# Patient Record
Sex: Female | Born: 1945
Health system: Southern US, Community
[De-identification: ages and names within clinical notes are randomized; demographics above are authoritative.]

## PROBLEM LIST (undated history)

## (undated) DIAGNOSIS — K7689 Other specified diseases of liver: Secondary | ICD-10-CM

## (undated) DIAGNOSIS — K5792 Diverticulitis of intestine, part unspecified, without perforation or abscess without bleeding: Secondary | ICD-10-CM

## (undated) DIAGNOSIS — S62109A Fracture of unspecified carpal bone, unspecified wrist, initial encounter for closed fracture: Secondary | ICD-10-CM

## (undated) DIAGNOSIS — M25512 Pain in left shoulder: Secondary | ICD-10-CM

## (undated) DIAGNOSIS — C801 Malignant (primary) neoplasm, unspecified: Secondary | ICD-10-CM

## (undated) DIAGNOSIS — F32A Depression, unspecified: Secondary | ICD-10-CM

## (undated) DIAGNOSIS — F419 Anxiety disorder, unspecified: Secondary | ICD-10-CM

## (undated) DIAGNOSIS — H409 Unspecified glaucoma: Secondary | ICD-10-CM

## (undated) DIAGNOSIS — M1990 Unspecified osteoarthritis, unspecified site: Secondary | ICD-10-CM

## (undated) DIAGNOSIS — R0600 Dyspnea, unspecified: Secondary | ICD-10-CM

## (undated) DIAGNOSIS — E785 Hyperlipidemia, unspecified: Secondary | ICD-10-CM

## (undated) DIAGNOSIS — E079 Disorder of thyroid, unspecified: Secondary | ICD-10-CM

## (undated) DIAGNOSIS — M810 Age-related osteoporosis without current pathological fracture: Secondary | ICD-10-CM

## (undated) DIAGNOSIS — T7840XA Allergy, unspecified, initial encounter: Secondary | ICD-10-CM

## (undated) DIAGNOSIS — E119 Type 2 diabetes mellitus without complications: Secondary | ICD-10-CM

## (undated) DIAGNOSIS — F329 Major depressive disorder, single episode, unspecified: Secondary | ICD-10-CM

## (undated) DIAGNOSIS — I10 Essential (primary) hypertension: Secondary | ICD-10-CM

## (undated) DIAGNOSIS — E039 Hypothyroidism, unspecified: Secondary | ICD-10-CM

## (undated) HISTORY — PX: WRIST FRACTURE SURGERY: SHX121

## (undated) HISTORY — DX: Hyperlipidemia, unspecified: E78.5

## (undated) HISTORY — PX: FRACTURE SURGERY: SHX138

## (undated) HISTORY — DX: Type 2 diabetes mellitus without complications: E11.9

## (undated) HISTORY — DX: Fracture of unspecified carpal bone, unspecified wrist, initial encounter for closed fracture: S62.109A

## (undated) HISTORY — DX: Disorder of thyroid, unspecified: E07.9

## (undated) HISTORY — PX: FOOT SURGERY: SHX648

## (undated) HISTORY — DX: Unspecified osteoarthritis, unspecified site: M19.90

## (undated) HISTORY — DX: Essential (primary) hypertension: I10

## (undated) HISTORY — DX: Anxiety disorder, unspecified: F41.9

## (undated) HISTORY — DX: Other specified diseases of liver: K76.89

## (undated) HISTORY — DX: Unspecified glaucoma: H40.9

## (undated) HISTORY — DX: Allergy, unspecified, initial encounter: T78.40XA

## (undated) HISTORY — PX: CARPAL TUNNEL RELEASE: SHX101

## (undated) HISTORY — DX: Malignant (primary) neoplasm, unspecified: C80.1

---
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
## 1978-05-17 HISTORY — PX: APPENDECTOMY: SHX54

## 1999-05-18 DIAGNOSIS — S62109A Fracture of unspecified carpal bone, unspecified wrist, initial encounter for closed fracture: Secondary | ICD-10-CM

## 1999-05-18 HISTORY — DX: Fracture of unspecified carpal bone, unspecified wrist, initial encounter for closed fracture: S62.109A

## 2000-05-17 LAB — HM DEXA SCAN: HM Dexa Scan: NORMAL

## 2003-05-18 HISTORY — PX: ABDOMINAL HYSTERECTOMY: SHX81

## 2003-05-18 LAB — HM PAP SMEAR: HM Pap smear: NORMAL

## 2004-01-06 ENCOUNTER — Other Ambulatory Visit: Payer: Self-pay

## 2004-07-22 ENCOUNTER — Ambulatory Visit: Payer: Self-pay | Admitting: Urology

## 2009-05-17 LAB — HM MAMMOGRAPHY: HM Mammogram: NORMAL

## 2013-02-12 ENCOUNTER — Telehealth: Payer: Self-pay | Admitting: Family Medicine

## 2013-02-12 NOTE — Telephone Encounter (Signed)
Pt is trying to est w/you as her PCP and needs an apptmt prior to the end of November due to meds needing to be refilled.  However, your next new pt apptmt is not until March, 2015.  Can you accommodate a new pt apptmt for her prior to the end of November? Thank you.

## 2013-02-12 NOTE — Telephone Encounter (Signed)
Please get her a appointment before the end of 11/14.  Get outside records in the meantime.  Thanks.

## 2013-02-13 NOTE — Telephone Encounter (Signed)
I spoke with patient and scheduled appointment on 03/15/13.  Patient will send records release to her old doctor when she receives the new patient packet.

## 2013-03-06 ENCOUNTER — Encounter: Payer: Self-pay | Admitting: Family Medicine

## 2013-03-06 LAB — GLUCOSE (CC13)
ALT: 20 U/L (ref 7–35)
AST: 13 U/L
Creat: 0.96
Glucose: 100

## 2013-03-06 LAB — CHOLESTEROL, TOTAL
Cholesterol: 168 mg/dL (ref 0–200)
HDL: 42 mg/dL (ref 35–70)

## 2013-03-06 LAB — HEMOGLOBIN A1C: Hgb A1c MFr Bld: 6 % (ref 4.0–6.0)

## 2013-03-15 ENCOUNTER — Encounter: Payer: Self-pay | Admitting: Family Medicine

## 2013-03-15 ENCOUNTER — Ambulatory Visit (INDEPENDENT_AMBULATORY_CARE_PROVIDER_SITE_OTHER): Payer: Medicare Other | Admitting: Family Medicine

## 2013-03-15 VITALS — BP 122/70 | HR 72 | Temp 98.1°F | Ht 63.0 in | Wt 163.8 lb

## 2013-03-15 DIAGNOSIS — R739 Hyperglycemia, unspecified: Secondary | ICD-10-CM | POA: Insufficient documentation

## 2013-03-15 DIAGNOSIS — E119 Type 2 diabetes mellitus without complications: Secondary | ICD-10-CM

## 2013-03-15 DIAGNOSIS — E7800 Pure hypercholesterolemia, unspecified: Secondary | ICD-10-CM

## 2013-03-15 DIAGNOSIS — I10 Essential (primary) hypertension: Secondary | ICD-10-CM

## 2013-03-15 DIAGNOSIS — M1990 Unspecified osteoarthritis, unspecified site: Secondary | ICD-10-CM | POA: Insufficient documentation

## 2013-03-15 DIAGNOSIS — H409 Unspecified glaucoma: Secondary | ICD-10-CM | POA: Insufficient documentation

## 2013-03-15 MED ORDER — FLUTICASONE PROPIONATE 50 MCG/ACT NA SUSP: 2.0000 | Freq: Every day | NASAL | Status: DC

## 2013-03-15 MED ORDER — ATORVASTATIN CALCIUM 10 MG PO TABS: 10.0000 mg | ORAL_TABLET | Freq: Every day | ORAL | Status: DC

## 2013-03-15 MED ORDER — LISINOPRIL-HYDROCHLOROTHIAZIDE 20-12.5 MG PO TABS: 1.0000 | ORAL_TABLET | Freq: Every day | ORAL | Status: DC

## 2013-03-15 NOTE — Patient Instructions (Signed)
Schedule a physical for the spring.  Take care.  Glad to see you.

## 2013-03-15 NOTE — Assessment & Plan Note (Signed)
Per A1c at goal, diet controlled, continue work on diet.

## 2013-03-15 NOTE — Assessment & Plan Note (Signed)
Per eye clinic.  

## 2013-03-15 NOTE — Progress Notes (Signed)
New patient.   Hypertension:    Using medication without problems or lightheadedness: yes Chest pain with exertion:no Edema:no Short of breath:no  OA in lower back, able to "get by" with prn ibuprofen.  No new sx, ie leg weakness or other concerns.   H/o glaucoma with eye exam and f/u pending.   Elevated Cholesterol: Using medications without problems:yes Muscle aches: no Diet compliance:yes Exercise:some  Diabetes:  Diet controlled Hypoglycemic episodes:no Hyperglycemic episodes:no Feet problems:no eye exam within last year:yes, 09/2012  Meds, vitals, and allergies reviewed.   PMH and SH reviewed  ROS: See HPI.  Otherwise negative.    GEN: nad, alert and oriented HEENT: mucous membranes moist NECK: supple w/o LA CV: rrr. PULM: ctab, no inc wob ABD: soft, +bs EXT: no edema SKIN: no acute rash

## 2013-03-15 NOTE — Assessment & Plan Note (Signed)
Continue prn nsaids.

## 2013-03-15 NOTE — Assessment & Plan Note (Signed)
Prev labs okay, continue current med.

## 2013-03-15 NOTE — Assessment & Plan Note (Signed)
Prev labs okay, continue current med.  

## 2013-09-06 ENCOUNTER — Other Ambulatory Visit: Payer: Self-pay | Admitting: Family Medicine

## 2013-09-06 DIAGNOSIS — E119 Type 2 diabetes mellitus without complications: Secondary | ICD-10-CM

## 2013-09-11 ENCOUNTER — Other Ambulatory Visit (INDEPENDENT_AMBULATORY_CARE_PROVIDER_SITE_OTHER): Payer: Commercial Managed Care - HMO

## 2013-09-11 DIAGNOSIS — E119 Type 2 diabetes mellitus without complications: Secondary | ICD-10-CM

## 2013-09-11 LAB — COMPREHENSIVE METABOLIC PANEL
ALBUMIN: 4.3 g/dL (ref 3.5–5.2)
ALT: 18 U/L (ref 0–35)
AST: 22 U/L (ref 0–37)
Alkaline Phosphatase: 58 U/L (ref 39–117)
BUN: 21 mg/dL (ref 6–23)
CALCIUM: 9.3 mg/dL (ref 8.4–10.5)
CHLORIDE: 105 meq/L (ref 96–112)
CO2: 28 meq/L (ref 19–32)
Creatinine, Ser: 1 mg/dL (ref 0.4–1.2)
GFR: 60.52 mL/min (ref >60.00)
GLUCOSE: 109 mg/dL — AB (ref 70–99)
POTASSIUM: 3.4 meq/L — AB (ref 3.5–5.1)
SODIUM: 140 meq/L (ref 135–145)
TOTAL PROTEIN: 7.2 g/dL (ref 6.0–8.3)
Total Bilirubin: 0.3 mg/dL (ref 0.3–1.2)

## 2013-09-11 LAB — LIPID PANEL
CHOLESTEROL: 153 mg/dL (ref 0–200)
HDL: 37.7 mg/dL — ABNORMAL LOW (ref >39.00)
LDL Cholesterol: 78 mg/dL (ref 0–99)
Total CHOL/HDL Ratio: 4
Triglycerides: 186 mg/dL — ABNORMAL HIGH (ref 0.0–149.0)
VLDL: 37.2 mg/dL (ref 0.0–40.0)

## 2013-09-11 LAB — HEMOGLOBIN A1C: Hgb A1c MFr Bld: 6.2 % (ref 4.6–6.5)

## 2013-09-18 ENCOUNTER — Encounter: Payer: Self-pay | Admitting: Family Medicine

## 2013-09-18 ENCOUNTER — Ambulatory Visit (INDEPENDENT_AMBULATORY_CARE_PROVIDER_SITE_OTHER): Payer: Commercial Managed Care - HMO | Admitting: Family Medicine

## 2013-09-18 VITALS — BP 146/66 | HR 69 | Temp 98.0°F | Ht 63.0 in | Wt 166.5 lb

## 2013-09-18 DIAGNOSIS — R7309 Other abnormal glucose: Secondary | ICD-10-CM

## 2013-09-18 DIAGNOSIS — E7800 Pure hypercholesterolemia, unspecified: Secondary | ICD-10-CM

## 2013-09-18 DIAGNOSIS — Z1211 Encounter for screening for malignant neoplasm of colon: Secondary | ICD-10-CM

## 2013-09-18 DIAGNOSIS — M25569 Pain in unspecified knee: Secondary | ICD-10-CM

## 2013-09-18 DIAGNOSIS — R739 Hyperglycemia, unspecified: Secondary | ICD-10-CM

## 2013-09-18 DIAGNOSIS — Z23 Encounter for immunization: Secondary | ICD-10-CM

## 2013-09-18 DIAGNOSIS — Z0000 Encounter for general adult medical examination without abnormal findings: Secondary | ICD-10-CM

## 2013-09-18 DIAGNOSIS — Z780 Asymptomatic menopausal state: Secondary | ICD-10-CM

## 2013-09-18 DIAGNOSIS — Z1231 Encounter for screening mammogram for malignant neoplasm of breast: Secondary | ICD-10-CM

## 2013-09-18 DIAGNOSIS — I10 Essential (primary) hypertension: Secondary | ICD-10-CM

## 2013-09-18 MED ORDER — FLUTICASONE PROPIONATE 50 MCG/ACT NA SUSP: 2.0000 | Freq: Every day | NASAL | Status: DC

## 2013-09-18 MED ORDER — LISINOPRIL-HYDROCHLOROTHIAZIDE 20-12.5 MG PO TABS: 1.0000 | ORAL_TABLET | Freq: Every day | ORAL | Status: DC

## 2013-09-18 MED ORDER — ATORVASTATIN CALCIUM 10 MG PO TABS: 10.0000 mg | ORAL_TABLET | Freq: Every day | ORAL | Status: DC

## 2013-09-18 NOTE — Patient Instructions (Addendum)
When you need a referral for the eye clinic, then let us know.    Go to the lab on the way out.  We'll contact you with your lab report. Check with your insurance to see if they will cover the shingles and tetanus shot. Stacey Boyd will call about your referral for the mammogram and bone density test.  Use the back exercises and let me know if your leg pain continues.  Recheck in about 6 months.  Take care.

## 2013-09-18 NOTE — Progress Notes (Signed)
Pre visit review using our clinic review tool, if applicable. No additional management support is needed unless otherwise documented below in the visit note.  I have personally reviewed the Medicare Annual Wellness questionnaire and have noted 1. The patient's medical and social history 2. Their use of alcohol, tobacco or illicit drugs 3. Their current medications and supplements 4. The patient's functional ability including ADL's, fall risks, home safety risks and hearing or visual             impairment. 5. Diet and physical activities 6. Evidence for depression or mood disorders  The patients weight, height, BMI have been recorded in the chart and visual acuity is per eye clinic.  I have made referrals, counseling and provided education to the patient based review of the above and I have provided the pt with a written personalized care plan for preventive services.  See scanned forms.  Routine anticipatory guidance given to patient.  See health maintenance. Flu 2014 Shingles d/w pt.  PNA 2015 Tetanus d/w pt.   D/w patient [id number] for colon cancer screening, including IFOB vs. colonoscopy.  Risks and benefits of both were discussed and patient voiced understanding.  Pt elects HRC:BULA Breast cancer screening d/w pt.   Advance directive- daughter Zacarias Pontes designated if patient were incapacitated.   Cognitive function addressed- see scanned forms- and if abnormal then additional documentation follows.   Hypertension:    Using medication without problems or lightheadedness: yes Chest pain with exertion:no Edema:no Short of breath:no  Elevated Cholesterol: Using medications without problems:yes Muscle aches: no Diet compliance:yes Exercise:yes Other complaints:  L lateral shin pain, burning, worse at night.  Not as noted during the day.  Waking at night from pain.  Going on for about 1 year.  No recent injury but she had injured her L leg back in '92. Fell down some steps back then.   Pinned her L foot under her body at the time. Her leg isn't weak.  No R leg sx.    PMH and SH reviewed  Meds, vitals, and allergies reviewed.   ROS: See HPI.  Otherwise negative.    GEN: nad, alert and oriented HEENT: mucous membranes moist NECK: supple w/o LA CV: rrr. PULM: ctab, no inc wob ABD: soft, +bs EXT: no edema SKIN: no acute rash L lower leg not ttp or puffy. No bruising.  Normal ROM at the knee and ankle. Can bear weight.  No rash Lower back ttp in midline and B paraspinal lumbar muscles.

## 2013-09-20 DIAGNOSIS — Z0000 Encounter for general adult medical examination without abnormal findings: Secondary | ICD-10-CM | POA: Insufficient documentation

## 2013-09-20 DIAGNOSIS — M533 Sacrococcygeal disorders, not elsewhere classified: Secondary | ICD-10-CM | POA: Insufficient documentation

## 2013-09-20 NOTE — Assessment & Plan Note (Signed)
Not DM2 by labs, continue as is.  Labs d/w pt along with diet and exercise.

## 2013-09-20 NOTE — Assessment & Plan Note (Signed)
See scanned forms.  Routine anticipatory guidance given to patient.  See health maintenance. Flu 2014 Shingles d/w pt.  PNA 2015 Tetanus d/w pt.   D/w patient DT:HYHOOIL for colon cancer screening, including IFOB vs. colonoscopy.  Risks and benefits of both were discussed and patient voiced understanding.  Pt elects NZV:JKQA Breast cancer screening d/w pt.   Advance directive- daughter Zacarias Pontes designated if patient were incapacitated.   Cognitive function addressed- see scanned forms- and if abnormal then additional documentation follows.

## 2013-09-20 NOTE — Assessment & Plan Note (Signed)
Only at night, this could be from her back.  D/w pt about stretching and f/u prn. She agrees.  Handout given.

## 2013-09-20 NOTE — Assessment & Plan Note (Signed)
Reasonable control, continue as is.  Labs d/w pt along with diet and exercise.  

## 2013-09-20 NOTE — Assessment & Plan Note (Signed)
Reasonable control, continue as is.  Labs d/w pt along with diet and exercise.

## 2013-09-24 ENCOUNTER — Other Ambulatory Visit (INDEPENDENT_AMBULATORY_CARE_PROVIDER_SITE_OTHER): Payer: Commercial Managed Care - HMO

## 2013-09-24 DIAGNOSIS — Z1211 Encounter for screening for malignant neoplasm of colon: Secondary | ICD-10-CM

## 2013-09-24 LAB — FECAL OCCULT BLOOD, IMMUNOCHEMICAL: Fecal Occult Bld: NEGATIVE

## 2013-09-25 ENCOUNTER — Encounter: Payer: Self-pay | Admitting: *Deleted

## 2013-09-27 ENCOUNTER — Encounter: Payer: Self-pay | Admitting: *Deleted

## 2014-03-21 ENCOUNTER — Ambulatory Visit: Payer: Commercial Managed Care - HMO | Admitting: Family Medicine

## 2014-03-22 ENCOUNTER — Encounter: Payer: Self-pay | Admitting: Family Medicine

## 2014-03-22 ENCOUNTER — Ambulatory Visit (INDEPENDENT_AMBULATORY_CARE_PROVIDER_SITE_OTHER): Payer: Commercial Managed Care - HMO | Admitting: Family Medicine

## 2014-03-22 VITALS — BP 134/74 | HR 68 | Temp 97.8°F | Wt 167.5 lb

## 2014-03-22 DIAGNOSIS — I10 Essential (primary) hypertension: Secondary | ICD-10-CM

## 2014-03-22 DIAGNOSIS — Z23 Encounter for immunization: Secondary | ICD-10-CM

## 2014-03-22 MED ORDER — ZOSTER VACCINE LIVE 19400 UNT/0.65ML ~~LOC~~ SOLR: 0.6500 mL | Freq: Once | SUBCUTANEOUS | Status: DC

## 2014-03-22 NOTE — Patient Instructions (Addendum)
Rosaria Ferries will call about your referral for a bone density and mammogram- see her on the way out today.  Take care. Glad to see you.  I'd like to see you back in about 6 months at a physical.   Get the shingles shot done at the pharmacy.  Call back with questions as needed.

## 2014-03-22 NOTE — Progress Notes (Signed)
Pre visit review using our clinic review tool, if applicable. No additional management support is needed unless otherwise documented below in the visit note.  Hypertension:    Using medication without problems or lightheadedness: yes Chest pain with exertion:no Edema:no Short of breath:no  She is thinking about moving into an apartment in town. She is considering her options.    She checked and her insurance would cover a tetanus shot here.  She got a flu shot mid 02/2014.  She needs rx for shingles shot.  She had an eye exam last week.  Needs help scheduling mammogram and DXA, see AVS.   Meds, vitals, and allergies reviewed.   PMH and SH reviewed  ROS: See HPI.  Otherwise negative.    GEN: nad, alert and oriented HEENT: mucous membranes moist NECK: supple w/o LA CV: rrr. PULM: ctab, no inc wob ABD: soft, +bs EXT: no edema SKIN: no acute rash

## 2014-03-24 NOTE — Assessment & Plan Note (Signed)
Controlled, continue current meds. D/w pt.  She agrees.  No ADE on med. Continue healthy diet/exercise.

## 2014-09-10 DIAGNOSIS — H4020X3 Unspecified primary angle-closure glaucoma, severe stage: Secondary | ICD-10-CM | POA: Diagnosis not present

## 2014-09-15 ENCOUNTER — Other Ambulatory Visit: Payer: Self-pay | Admitting: Family Medicine

## 2014-09-15 DIAGNOSIS — R739 Hyperglycemia, unspecified: Secondary | ICD-10-CM

## 2014-09-16 ENCOUNTER — Other Ambulatory Visit (INDEPENDENT_AMBULATORY_CARE_PROVIDER_SITE_OTHER): Payer: Commercial Managed Care - HMO

## 2014-09-16 DIAGNOSIS — R739 Hyperglycemia, unspecified: Secondary | ICD-10-CM | POA: Diagnosis not present

## 2014-09-16 LAB — LIPID PANEL
CHOL/HDL RATIO: 5
CHOLESTEROL: 173 mg/dL (ref 0–200)
HDL: 35.1 mg/dL — ABNORMAL LOW (ref >39.00)
LDL Cholesterol: 99 mg/dL (ref 0–99)
NonHDL: 137.9
Triglycerides: 193 mg/dL — ABNORMAL HIGH (ref 0.0–149.0)
VLDL: 38.6 mg/dL (ref 0.0–40.0)

## 2014-09-16 LAB — COMPREHENSIVE METABOLIC PANEL
ALT: 15 U/L (ref 0–35)
AST: 16 U/L (ref 0–37)
Albumin: 4.3 g/dL (ref 3.5–5.2)
Alkaline Phosphatase: 79 U/L (ref 39–117)
BUN: 30 mg/dL — AB (ref 6–23)
CALCIUM: 10.1 mg/dL (ref 8.4–10.5)
CHLORIDE: 101 meq/L (ref 96–112)
CO2: 27 mEq/L (ref 19–32)
CREATININE: 1.26 mg/dL — AB (ref 0.40–1.20)
GFR: 44.62 mL/min — ABNORMAL LOW (ref >60.00)
Glucose, Bld: 122 mg/dL — ABNORMAL HIGH (ref 70–99)
POTASSIUM: 4.1 meq/L (ref 3.5–5.1)
Sodium: 136 mEq/L (ref 135–145)
TOTAL PROTEIN: 7.8 g/dL (ref 6.0–8.3)
Total Bilirubin: 0.4 mg/dL (ref 0.2–1.2)

## 2014-09-16 LAB — HEMOGLOBIN A1C: Hgb A1c MFr Bld: 6.5 % (ref 4.6–6.5)

## 2014-09-17 DIAGNOSIS — Z0100 Encounter for examination of eyes and vision without abnormal findings: Secondary | ICD-10-CM | POA: Diagnosis not present

## 2014-09-23 ENCOUNTER — Encounter: Payer: Self-pay | Admitting: Family Medicine

## 2014-09-23 ENCOUNTER — Ambulatory Visit (INDEPENDENT_AMBULATORY_CARE_PROVIDER_SITE_OTHER): Payer: Commercial Managed Care - HMO | Admitting: Family Medicine

## 2014-09-23 VITALS — BP 124/64 | HR 79 | Temp 97.5°F | Ht 63.0 in | Wt 168.5 lb

## 2014-09-23 DIAGNOSIS — Z1211 Encounter for screening for malignant neoplasm of colon: Secondary | ICD-10-CM | POA: Diagnosis not present

## 2014-09-23 DIAGNOSIS — I10 Essential (primary) hypertension: Secondary | ICD-10-CM | POA: Diagnosis not present

## 2014-09-23 DIAGNOSIS — Z7189 Other specified counseling: Secondary | ICD-10-CM

## 2014-09-23 DIAGNOSIS — E7800 Pure hypercholesterolemia, unspecified: Secondary | ICD-10-CM

## 2014-09-23 DIAGNOSIS — Z0000 Encounter for general adult medical examination without abnormal findings: Secondary | ICD-10-CM | POA: Diagnosis not present

## 2014-09-23 DIAGNOSIS — R748 Abnormal levels of other serum enzymes: Secondary | ICD-10-CM | POA: Diagnosis not present

## 2014-09-23 DIAGNOSIS — R739 Hyperglycemia, unspecified: Secondary | ICD-10-CM

## 2014-09-23 DIAGNOSIS — Z23 Encounter for immunization: Secondary | ICD-10-CM

## 2014-09-23 DIAGNOSIS — Z780 Asymptomatic menopausal state: Secondary | ICD-10-CM

## 2014-09-23 DIAGNOSIS — R7989 Other specified abnormal findings of blood chemistry: Secondary | ICD-10-CM

## 2014-09-23 LAB — BASIC METABOLIC PANEL
BUN: 28 mg/dL — AB (ref 6–23)
CALCIUM: 9.8 mg/dL (ref 8.4–10.5)
CO2: 31 mEq/L (ref 19–32)
CREATININE: 1.08 mg/dL (ref 0.40–1.20)
Chloride: 102 mEq/L (ref 96–112)
GFR: 53.3 mL/min — ABNORMAL LOW (ref >60.00)
GLUCOSE: 135 mg/dL — AB (ref 70–99)
Potassium: 3.6 mEq/L (ref 3.5–5.1)
Sodium: 138 mEq/L (ref 135–145)

## 2014-09-23 MED ORDER — FLUTICASONE PROPIONATE 50 MCG/ACT NA SUSP: 2.0000 | Freq: Every day | NASAL | Status: DC

## 2014-09-23 MED ORDER — LISINOPRIL-HYDROCHLOROTHIAZIDE 20-12.5 MG PO TABS: 1.0000 | ORAL_TABLET | Freq: Every day | ORAL | Status: DC

## 2014-09-23 MED ORDER — ATORVASTATIN CALCIUM 10 MG PO TABS: 10.0000 mg | ORAL_TABLET | Freq: Every day | ORAL | Status: DC

## 2014-09-23 NOTE — Patient Instructions (Addendum)
Go to the lab on the way out.  We'll contact you with your lab report (stool cards and BMET). Rosaria Ferries will call about your referral (bone density).  Recheck A1c in about 3-4 months before a visit.  Take care.  Glad to see you.  Good luck with the move to the apartment.

## 2014-09-23 NOTE — Progress Notes (Signed)
Pre visit review using our clinic review tool, if applicable. No additional management support is needed unless otherwise documented below in the visit note.  CPE- See plan.  Routine anticipatory guidance given to patient.  See health maintenance. Flu prev done Shingles 2016 PNA 2015 Tetanus 2015 D/w patient OE:[id number] for colon cancer screening, including IFOB vs. colonoscopy. Risks and benefits of both were discussed and patient voiced understanding. Pt elects [facility]:WEXH Breast cancer screening d/w pt. She didn't want to go through the screening at this point.  She didn't think she could tolerate the breast compression for the test.  She hasn't noted a lump or a mass.  She'll will consider the offer for screening and let me know if she wants to proceed.   D/w pt about DXA.  Ordered.  Advance directive- daughter Zacarias Pontes designated if patient were incapacitated.  Diet and exercise d/w pt.    Sugar is up, d/w pt.  A1c now 6.5, ie dx DM2.  She was caring for mult family members, both elderly and young family members.  She had to cut back on both since it was too much of a strain on her.  "I let myself go in the meantime, but I'm getting there."  She is working on getting an apartment and that should help.  She hasn't been able to exercise as much.  She is going to work on diet and exercise in the near future and that should help.  She is aware of DM2 dx and plans, is familiar based on her family members.  D/w pt about recheck A1c in about 3-4 months.   Elevated Cholesterol: Using medications without problems: yes Muscle aches: no Diet compliance: see above Exercise: see above  Hypertension:    Using medication without problems or lightheadedness: yes Chest pain with exertion:no Edema:no Short of breath:no  Elevated Cr. Mildly elevated from prev.  Repeat BMET pending today.  D/w pt.  PMH and SH reviewed  Meds, vitals, and allergies reviewed.   ROS: See HPI.  Otherwise negative.     GEN: nad, alert and oriented HEENT: mucous membranes moist NECK: supple w/o LA CV: rrr. PULM: ctab, no inc wob ABD: soft, +bs EXT: no edema SKIN: no acute rash

## 2014-09-24 DIAGNOSIS — Z0001 Encounter for general adult medical examination with abnormal findings: Secondary | ICD-10-CM | POA: Insufficient documentation

## 2014-09-24 DIAGNOSIS — R7989 Other specified abnormal findings of blood chemistry: Secondary | ICD-10-CM | POA: Insufficient documentation

## 2014-09-24 NOTE — Assessment & Plan Note (Signed)
Routine anticipatory guidance given to patient.  See health maintenance. Flu prev done Shingles 2016 PNA 2015 Tetanus 2015 D/w patient XO:VANVBTY for colon cancer screening, including IFOB vs. colonoscopy. Risks and benefits of both were discussed and patient voiced understanding. Pt elects OMA:YOKH Breast cancer screening d/w pt. She didn't want to go through the screening at this point.  She didn't think she could tolerate the breast compression for the test.  She hasn't noted a lump or a mass.  She'll will consider the offer for screening and let me know if she wants to proceed.   D/w pt about DXA.  Ordered.  Advance directive- daughter Zacarias Pontes designated if patient were incapacitated.  Diet and exercise d/w pt.

## 2014-09-24 NOTE — Assessment & Plan Note (Signed)
Controlled, continue current meds.  She agrees.

## 2014-09-24 NOTE — Assessment & Plan Note (Signed)
This may have been from fasting, relative dehydration, recheck bmet today, d/w pt about plan, see notes on labs.

## 2014-09-24 NOTE — Assessment & Plan Note (Signed)
Sugar is up, d/w pt. A1c now 6.5, ie dx DM2. She was caring for mult family members, both elderly and young family members. She had to cut back on both since it was too much of a strain on her. "I let myself go in the meantime, but I'm getting there." She is working on getting an apartment and that should help. She hasn't been able to exercise as much. She is going to work on diet and exercise in the near future and that should help. She is aware of DM2 dx and plans, is familiar based on her family members. D/w pt about recheck A1c in about 3-4 months.  I expect her not to be diabetic on the recheck.

## 2014-09-24 NOTE — Assessment & Plan Note (Signed)
TG up, should improve with lifestyle change, continue statin.  She agrees.

## 2014-09-30 ENCOUNTER — Other Ambulatory Visit: Payer: Self-pay | Admitting: Family Medicine

## 2014-10-01 ENCOUNTER — Other Ambulatory Visit (INDEPENDENT_AMBULATORY_CARE_PROVIDER_SITE_OTHER): Payer: Commercial Managed Care - HMO

## 2014-10-01 DIAGNOSIS — Z1211 Encounter for screening for malignant neoplasm of colon: Secondary | ICD-10-CM

## 2014-10-01 LAB — FECAL OCCULT BLOOD, IMMUNOCHEMICAL: FECAL OCCULT BLD: NEGATIVE

## 2014-10-07 ENCOUNTER — Other Ambulatory Visit: Payer: Self-pay | Admitting: Family Medicine

## 2014-10-08 ENCOUNTER — Other Ambulatory Visit: Payer: Commercial Managed Care - HMO

## 2014-10-08 ENCOUNTER — Ambulatory Visit: Payer: Commercial Managed Care - HMO

## 2014-11-14 ENCOUNTER — Ambulatory Visit
Admission: RE | Admit: 2014-11-14 | Discharge: 2014-11-14 | Disposition: A | Discharge status: Home / Self Care | Payer: Commercial Managed Care - HMO | Source: Ambulatory Visit | Attending: Family Medicine | Admitting: Family Medicine

## 2014-11-14 ENCOUNTER — Ambulatory Visit: Payer: Commercial Managed Care - HMO

## 2014-11-14 DIAGNOSIS — Z780 Asymptomatic menopausal state: Secondary | ICD-10-CM | POA: Diagnosis not present

## 2014-11-14 DIAGNOSIS — M8580 Other specified disorders of bone density and structure, unspecified site: Secondary | ICD-10-CM | POA: Diagnosis not present

## 2014-11-14 DIAGNOSIS — M85852 Other specified disorders of bone density and structure, left thigh: Secondary | ICD-10-CM | POA: Diagnosis not present

## 2014-11-15 ENCOUNTER — Encounter: Payer: Self-pay | Admitting: *Deleted

## 2014-11-15 DIAGNOSIS — M8580 Other specified disorders of bone density and structure, unspecified site: Secondary | ICD-10-CM | POA: Insufficient documentation

## 2015-01-17 ENCOUNTER — Other Ambulatory Visit: Payer: Commercial Managed Care - HMO

## 2015-01-24 ENCOUNTER — Ambulatory Visit: Payer: Commercial Managed Care - HMO | Admitting: Family Medicine

## 2015-02-18 DIAGNOSIS — I10 Essential (primary) hypertension: Secondary | ICD-10-CM | POA: Insufficient documentation

## 2015-02-18 DIAGNOSIS — N183 Chronic kidney disease, stage 3 unspecified: Secondary | ICD-10-CM | POA: Insufficient documentation

## 2015-02-18 DIAGNOSIS — E7800 Pure hypercholesterolemia, unspecified: Secondary | ICD-10-CM | POA: Diagnosis not present

## 2015-02-18 DIAGNOSIS — Z23 Encounter for immunization: Secondary | ICD-10-CM | POA: Diagnosis not present

## 2015-02-18 DIAGNOSIS — E1122 Type 2 diabetes mellitus with diabetic chronic kidney disease: Secondary | ICD-10-CM | POA: Diagnosis not present

## 2015-03-05 DIAGNOSIS — H401131 Primary open-angle glaucoma, bilateral, mild stage: Secondary | ICD-10-CM | POA: Diagnosis not present

## 2015-03-10 ENCOUNTER — Other Ambulatory Visit: Payer: Self-pay | Admitting: Physician Assistant

## 2015-03-10 ENCOUNTER — Ambulatory Visit
Admission: RE | Admit: 2015-03-10 | Discharge: 2015-03-10 | Disposition: A | Discharge status: Home / Self Care | Payer: Commercial Managed Care - HMO | Source: Ambulatory Visit | Attending: Physician Assistant | Admitting: Physician Assistant

## 2015-03-10 DIAGNOSIS — K5732 Diverticulitis of large intestine without perforation or abscess without bleeding: Secondary | ICD-10-CM | POA: Diagnosis not present

## 2015-03-10 DIAGNOSIS — R1032 Left lower quadrant pain: Secondary | ICD-10-CM

## 2015-03-10 DIAGNOSIS — I10 Essential (primary) hypertension: Secondary | ICD-10-CM | POA: Diagnosis not present

## 2015-03-10 DIAGNOSIS — E1122 Type 2 diabetes mellitus with diabetic chronic kidney disease: Secondary | ICD-10-CM | POA: Diagnosis not present

## 2015-03-10 DIAGNOSIS — K7689 Other specified diseases of liver: Secondary | ICD-10-CM | POA: Insufficient documentation

## 2015-03-10 DIAGNOSIS — N183 Chronic kidney disease, stage 3 (moderate): Secondary | ICD-10-CM | POA: Diagnosis not present

## 2015-03-10 MED ORDER — IOHEXOL 300 MG/ML  SOLN
100.0000 mL | Freq: Once | INTRAMUSCULAR | Status: AC | PRN
  Administered 2015-03-10: 100 mL via INTRAVENOUS

## 2015-03-12 DIAGNOSIS — H401132 Primary open-angle glaucoma, bilateral, moderate stage: Secondary | ICD-10-CM | POA: Diagnosis not present

## 2015-04-23 DIAGNOSIS — R51 Headache: Secondary | ICD-10-CM | POA: Diagnosis not present

## 2015-04-23 DIAGNOSIS — I10 Essential (primary) hypertension: Secondary | ICD-10-CM | POA: Diagnosis not present

## 2015-04-23 DIAGNOSIS — Z8669 Personal history of other diseases of the nervous system and sense organs: Secondary | ICD-10-CM | POA: Diagnosis not present

## 2015-04-30 DIAGNOSIS — H401132 Primary open-angle glaucoma, bilateral, moderate stage: Secondary | ICD-10-CM | POA: Diagnosis not present

## 2015-06-26 ENCOUNTER — Other Ambulatory Visit: Payer: Self-pay | Admitting: Family Medicine

## 2015-08-19 DIAGNOSIS — E1122 Type 2 diabetes mellitus with diabetic chronic kidney disease: Secondary | ICD-10-CM | POA: Diagnosis not present

## 2015-08-19 DIAGNOSIS — Z0100 Encounter for examination of eyes and vision without abnormal findings: Secondary | ICD-10-CM | POA: Diagnosis not present

## 2015-08-19 DIAGNOSIS — E7800 Pure hypercholesterolemia, unspecified: Secondary | ICD-10-CM | POA: Diagnosis not present

## 2015-08-19 DIAGNOSIS — N183 Chronic kidney disease, stage 3 (moderate): Secondary | ICD-10-CM | POA: Diagnosis not present

## 2015-08-19 DIAGNOSIS — I10 Essential (primary) hypertension: Secondary | ICD-10-CM | POA: Diagnosis not present

## 2015-09-02 DIAGNOSIS — H401132 Primary open-angle glaucoma, bilateral, moderate stage: Secondary | ICD-10-CM | POA: Diagnosis not present

## 2015-09-05 DIAGNOSIS — R0609 Other forms of dyspnea: Secondary | ICD-10-CM | POA: Diagnosis not present

## 2015-09-05 DIAGNOSIS — E1122 Type 2 diabetes mellitus with diabetic chronic kidney disease: Secondary | ICD-10-CM | POA: Diagnosis not present

## 2015-09-05 DIAGNOSIS — R0600 Dyspnea, unspecified: Secondary | ICD-10-CM | POA: Diagnosis not present

## 2015-09-05 DIAGNOSIS — N183 Chronic kidney disease, stage 3 (moderate): Secondary | ICD-10-CM | POA: Diagnosis not present

## 2015-09-05 DIAGNOSIS — I10 Essential (primary) hypertension: Secondary | ICD-10-CM | POA: Diagnosis not present

## 2015-09-12 ENCOUNTER — Other Ambulatory Visit: Payer: Self-pay | Admitting: Cardiology

## 2015-09-12 DIAGNOSIS — E7800 Pure hypercholesterolemia, unspecified: Secondary | ICD-10-CM | POA: Diagnosis not present

## 2015-09-12 DIAGNOSIS — N183 Chronic kidney disease, stage 3 (moderate): Secondary | ICD-10-CM | POA: Diagnosis not present

## 2015-09-12 DIAGNOSIS — R0789 Other chest pain: Secondary | ICD-10-CM | POA: Diagnosis not present

## 2015-09-12 DIAGNOSIS — I10 Essential (primary) hypertension: Secondary | ICD-10-CM | POA: Diagnosis not present

## 2015-09-12 DIAGNOSIS — R0609 Other forms of dyspnea: Secondary | ICD-10-CM | POA: Diagnosis not present

## 2015-09-12 DIAGNOSIS — E1122 Type 2 diabetes mellitus with diabetic chronic kidney disease: Secondary | ICD-10-CM | POA: Diagnosis not present

## 2015-09-17 ENCOUNTER — Encounter
Admission: RE | Disposition: A | Discharge status: Home / Self Care | Payer: Self-pay | Source: Ambulatory Visit | Attending: Cardiology

## 2015-09-17 ENCOUNTER — Ambulatory Visit
Admission: RE | Admit: 2015-09-17 | Discharge: 2015-09-17 | Disposition: A | Discharge status: Home / Self Care | Payer: Commercial Managed Care - HMO | Source: Ambulatory Visit | Attending: Cardiology | Admitting: Cardiology

## 2015-09-17 DIAGNOSIS — E7800 Pure hypercholesterolemia, unspecified: Secondary | ICD-10-CM | POA: Diagnosis not present

## 2015-09-17 DIAGNOSIS — Z885 Allergy status to narcotic agent status: Secondary | ICD-10-CM | POA: Diagnosis not present

## 2015-09-17 DIAGNOSIS — Z8249 Family history of ischemic heart disease and other diseases of the circulatory system: Secondary | ICD-10-CM | POA: Diagnosis not present

## 2015-09-17 DIAGNOSIS — Z90710 Acquired absence of both cervix and uterus: Secondary | ICD-10-CM | POA: Insufficient documentation

## 2015-09-17 DIAGNOSIS — E785 Hyperlipidemia, unspecified: Secondary | ICD-10-CM | POA: Diagnosis not present

## 2015-09-17 DIAGNOSIS — Z79899 Other long term (current) drug therapy: Secondary | ICD-10-CM | POA: Insufficient documentation

## 2015-09-17 DIAGNOSIS — I129 Hypertensive chronic kidney disease with stage 1 through stage 4 chronic kidney disease, or unspecified chronic kidney disease: Secondary | ICD-10-CM | POA: Insufficient documentation

## 2015-09-17 DIAGNOSIS — R079 Chest pain, unspecified: Secondary | ICD-10-CM | POA: Diagnosis not present

## 2015-09-17 DIAGNOSIS — R5383 Other fatigue: Secondary | ICD-10-CM | POA: Diagnosis not present

## 2015-09-17 DIAGNOSIS — E1122 Type 2 diabetes mellitus with diabetic chronic kidney disease: Secondary | ICD-10-CM | POA: Insufficient documentation

## 2015-09-17 DIAGNOSIS — N183 Chronic kidney disease, stage 3 (moderate): Secondary | ICD-10-CM | POA: Insufficient documentation

## 2015-09-17 DIAGNOSIS — R0602 Shortness of breath: Secondary | ICD-10-CM | POA: Diagnosis not present

## 2015-09-17 DIAGNOSIS — I25719 Atherosclerosis of autologous vein coronary artery bypass graft(s) with unspecified angina pectoris: Secondary | ICD-10-CM | POA: Diagnosis not present

## 2015-09-17 HISTORY — PX: CARDIAC CATHETERIZATION: SHX172

## 2015-09-17 SURGERY — LEFT HEART CATH AND CORONARY ANGIOGRAPHY
Anesthesia: Moderate Sedation | Laterality: Left

## 2015-09-17 MED ORDER — SODIUM CHLORIDE 0.9 % IV SOLN: 250.0000 mL | INTRAVENOUS | Status: DC | PRN

## 2015-09-17 MED ORDER — MIDAZOLAM HCL 2 MG/2ML IJ SOLN
INTRAMUSCULAR | Status: DC | PRN
  Administered 2015-09-17: 1 mg via INTRAVENOUS

## 2015-09-17 MED ORDER — SODIUM CHLORIDE 0.9 % WEIGHT BASED INFUSION: 3.0000 mL/kg/h | INTRAVENOUS | Status: DC

## 2015-09-17 MED ORDER — MIDAZOLAM HCL 2 MG/2ML IJ SOLN
INTRAMUSCULAR | Status: AC
  Filled 2015-09-17: qty 2

## 2015-09-17 MED ORDER — IOPAMIDOL (ISOVUE-300) INJECTION 61%
INTRAVENOUS | Status: DC | PRN
  Administered 2015-09-17: 100 mL via INTRA_ARTERIAL

## 2015-09-17 MED ORDER — FENTANYL CITRATE (PF) 100 MCG/2ML IJ SOLN
INTRAMUSCULAR | Status: AC
  Filled 2015-09-17: qty 2

## 2015-09-17 MED ORDER — SODIUM CHLORIDE 0.9% FLUSH: 3.0000 mL | Freq: Two times a day (BID) | INTRAVENOUS | Status: DC

## 2015-09-17 MED ORDER — FENTANYL CITRATE (PF) 100 MCG/2ML IJ SOLN
INTRAMUSCULAR | Status: DC | PRN
  Administered 2015-09-17: 25 ug via INTRAVENOUS

## 2015-09-17 MED ORDER — SODIUM CHLORIDE 0.9 % WEIGHT BASED INFUSION: 1.0000 mL/kg/h | INTRAVENOUS | Status: DC

## 2015-09-17 MED ORDER — HEPARIN (PORCINE) IN NACL 2-0.9 UNIT/ML-% IJ SOLN
INTRAMUSCULAR | Status: AC
  Filled 2015-09-17: qty 1000

## 2015-09-17 MED ORDER — SODIUM CHLORIDE 0.9% FLUSH: 3.0000 mL | INTRAVENOUS | Status: DC | PRN

## 2015-09-17 MED ORDER — ASPIRIN 81 MG PO CHEW: 81.0000 mg | CHEWABLE_TABLET | ORAL | Status: DC

## 2015-09-17 SURGICAL SUPPLY — 9 items
CATH INFINITI 5FR ANG PIGTAIL (CATHETERS) ×2 IMPLANT
CATH INFINITI 5FR JL4 (CATHETERS) ×2 IMPLANT
CATH INFINITI JR4 5F (CATHETERS) ×2 IMPLANT
DEVICE CLOSURE MYNXGRIP 5F (Vascular Products) ×2 IMPLANT
KIT MANI 3VAL PERCEP (MISCELLANEOUS) ×2 IMPLANT
NEEDLE PERC 18GX7CM (NEEDLE) ×2 IMPLANT
PACK CARDIAC CATH (CUSTOM PROCEDURE TRAY) ×2 IMPLANT
SHEATH AVANTI 5FR X 11CM (SHEATH) ×2 IMPLANT
WIRE EMERALD 3MM-J .035X150CM (WIRE) ×2 IMPLANT

## 2015-09-17 NOTE — H&P (Signed)
Chief Complaint: Chief Complaint  Patient presents with  . Establish Care  per mfm positive stress test  . Shortness of Breath  when going up hill cant get air in or out  . Fatigue  Date of Service: 09/12/2015 Date of Birth: April 05, 1946 PCP: Harrold Donath, MD  History of Present Illness: [name] is a 70 y.o.female patient who presents for evaluation after undergoing a stress echo per Dr. Emily Filbert which was read as showing ischemia. Patient has had difficulty ambulating recently. She had been a quite vigorous active lady until recently has reproducible dyspnea on exertion when walking up a hill. The symptoms have been persistent and increasing somewhat. She denies rest symptoms. She denies syncope or presyncope. She has a history of hypertension. Hyperlipidemia is treated with atorvastatin. She is also on propranolol. She denies syncope or presyncope. She denies tobacco use. She has a family history of heart disease.  Past Medical and Surgical History  Past Medical History Past Medical History  Diagnosis Date  . Diabetes mellitus type 2, uncomplicated (CMS-HCC)  . Hyperlipidemia, unspecified  . Hypertension   Past Surgical History She has a past surgical history that includes Hysterectomy and Closed Reduction Wrist Fracture.   Medications and Allergies  Current Medications  Current Outpatient Prescriptions  Medication Sig Dispense Refill  . atorvastatin (LIPITOR) 10 MG tablet Take 10 mg by mouth once daily.  . diclofenac (VOLTAREN) 1 % topical gel Apply 4 g topically 4 (four) times daily. 100 g 11  . fluticasone (FLONASE) 50 mcg/actuation nasal spray Place 2 sprays into both nostrils once daily.  Marland Kitchen latanoprost (XALATAN) 0.005 % ophthalmic solution 1 drop nightly.  Marland Kitchen lisinopril-hydrochlorothiazide (PRINZIDE,ZESTORETIC) 20-12.5 mg tablet Take 1 tablet by mouth once daily.  . propranolol (INDERAL) 40 MG tablet Take 0.5 tablets (20 mg total) by mouth 2 (two) times  daily. 60 tablet 11   No current facility-administered medications for this visit.   Allergies: Codeine  Social and Family History  Social History reports that she has never smoked. She has never used smokeless tobacco. She reports that she does not drink alcohol.  Family History Family History  Problem Relation Age of Onset  . Coronary artery disease Mother  . Coronary artery disease Father   Review of Systems  Review of Systems  Constitutional: Negative for chills, diaphoresis, fever, malaise/fatigue and weight loss.  HENT: Negative for congestion, ear discharge, hearing loss and tinnitus.  Eyes: Negative for blurred vision.  Respiratory: Positive for shortness of breath. Negative for cough, hemoptysis, sputum production and wheezing.  Cardiovascular: Positive for chest pain. Negative for palpitations, orthopnea, claudication, leg swelling and PND.  Gastrointestinal: Negative for abdominal pain, blood in stool, constipation, diarrhea, heartburn, melena, nausea and vomiting.  Genitourinary: Negative for dysuria, frequency, hematuria and urgency.  Musculoskeletal: Negative for back pain, falls, joint pain and myalgias.  Skin: Negative for itching and rash.  Neurological: Negative for dizziness, tingling, focal weakness, loss of consciousness, weakness and headaches.  Endo/Heme/Allergies: Negative for polydipsia. Does not bruise/bleed easily.  Psychiatric/Behavioral: Negative for depression, memory loss and substance abuse. The patient is not nervous/anxious.   Physical Examination   Vitals: Visit Vitals  . BP 130/70  . Pulse 68  . Resp 12  . Ht 160 cm (5\' 3" )  . Wt 74.4 kg (164 lb)  . BMI 29.05 kg/m2   Ht:160 cm (5\' 3" ) Wt:74.4 kg (164 lb) [id number] surface area is 1.82 meters squared. Body mass index is 29.05 kg/(m^2).  Wt Readings from Last 3 Encounters:  09/12/15 74.4 kg (164 lb)  09/05/15 74.4 kg (164 lb)  08/19/15 72.6 kg (160 lb)   BP Readings from Last 3  Encounters:  09/12/15 130/70  09/05/15 132/60  08/19/15 131/80   General appearance appears in no acute distress  Head Mouth and Eye exam Normocephalic, without obvious abnormality, atraumatic Dentition is good Eyes appear anicteric   Neck exam Thyroid: normal  Nodes: no obvious adenopathy  LUNGS Breath Sounds: Normal Percussion: Normal  CARDIOVASCULAR JVP CV wave: no HJR: no Elevation at 90 degrees: None Carotid Pulse: normal pulsation bilaterally Bruit: None Apex: apical impulse normal  Auscultation Rhythm: normal sinus rhythm S1: normal S2: normal Clicks: no Rub: no Murmurs: no murmurs  Gallop: None ABDOMEN Liver enlargement: no Pulsatile aorta: no Ascites: no Bruits: no  EXTREMITIES Clubbing: no Edema: trace to 1+ bilateral pedal edema Pulses: peripheral pulses symmetrical Femoral Bruits: no Amputation: no SKIN Rash: no Cyanosis: no Embolic phemonenon: no Bruising: no NEURO Alert and Oriented to person, place and time: yes Non focal: yes  PSYCH: Pt appears to have normal affect  LABS REVIEWED Last 3 CBC results: Lab Results  Component Value Date  WBC 6.8 09/12/2015  WBC 7.1 09/05/2015  WBC 11.2 (H) 03/10/2015   Lab Results  Component Value Date  HGB 13.6 09/12/2015  HGB 13.0 09/05/2015  HGB 13.2 03/10/2015   Lab Results  Component Value Date  HCT 39.5 09/12/2015  HCT 38.1 09/05/2015  HCT 39.7 03/10/2015   Lab Results  Component Value Date  PLT 285 09/12/2015  PLT 269 09/05/2015  PLT 262 03/10/2015   Lab Results  Component Value Date  CREATININE 1.1 09/12/2015  BUN 28 (H) 09/12/2015  NA 141 09/12/2015  K 4.1 09/12/2015  CL 103 09/12/2015  CO2 32.2 (H) 09/12/2015   Lab Results  Component Value Date  HGBA1C 6.3 (H) 08/19/2015   Lab Results  Component Value Date  HDL 43.5 08/19/2015  HDL 40.2 02/18/2015   Lab Results  Component Value Date  LDLCALC 108 08/19/2015  LDLCALC 83 02/18/2015   Lab Results   Component Value Date  TRIG 151 08/19/2015  TRIG 144 02/18/2015   Lab Results  Component Value Date  ALT 21 08/19/2015  AST 21 08/19/2015  ALKPHOS 53 08/19/2015   Lab Results  Component Value Date  TSH 4.652 09/05/2015   Diagnostic Studies Reviewed:  EKG EKG demonstrated normal sinus rhythm, nonspecific ST and T waves changes.  Assessment and Plan   70 y.o. female with  ICD-10-CM ICD-9-CM  1. Type 2 diabetes mellitus with stage 3 chronic kidney disease, without long-term current use of insulin (CMS-HCC)-currently stable. Will continue on current regimen with follow-up per Dr. Sabra Heck E11.22 250.40  N18.3 585.3  2. HTN, goal below 140/90-continue with lisinopril hydrochlorothiazide and DASH diet I10 401.9  3. Pure hypercholesterolemia-continue with atorvastatin with an LDL goal of less than 100 E78.00 272.0  4. Atypical chest pain-chest pain with exertion. High risk stress echo per Dr. Sabra Heck. Risk and benefits left cardiac catheterization were explained. We will proceed with a left cardiac cath to evaluate coronary anatomy with further recommendations after this is completed. R07.89 [id number] Basic Metabolic Panel (BMP)  CBC w/auto Differential (5 Part)   Return in about 1 week (around 09/19/2015).  These notes generated with voice recognition software. I apologize for typographical errors.  Sydnee Levans, MD

## 2015-09-17 NOTE — Discharge Instructions (Signed)
Place your patient instructions text here.Angiogram, Care After Refer to this sheet in the next few weeks. These instructions provide you with information about caring for yourself after your procedure. Your health care provider may also give you more specific instructions. Your treatment has been planned according to current medical practices, but problems sometimes occur. Call your health care provider if you have any problems or questions after your procedure. WHAT TO EXPECT AFTER THE PROCEDURE After your procedure, it is typical to have the following:  Bruising at the catheter insertion site that usually fades within 1-2 weeks.  Blood collecting in the tissue (hematoma) that may be painful to the touch. It should usually decrease in size and tenderness within 1-2 weeks. HOME CARE INSTRUCTIONS  Take medicines only as directed by your health care provider.  You may shower 24-48 hours after the procedure or as directed by your health care provider. Remove the bandage (dressing) and gently wash the site with plain soap and water. Pat the area dry with a clean towel. Do not rub the site, because this may cause bleeding.  Do not take baths, swim, or use a hot tub until your health care provider approves.  Check your insertion site every day for redness, swelling, or drainage.  Do not apply powder or lotion to the site.  Do not lift over 10 lb (4.5 kg) for 5 days after your procedure or as directed by your health care provider.  Ask your health care provider when it is okay to:  Return to work or school.  Resume usual physical activities or sports.  Resume sexual activity.  Do not drive home if you are discharged the same day as the procedure. Have someone else drive you.  You may drive 24 hours after the procedure unless otherwise instructed by your health care provider.  Do not operate machinery or power tools for 24 hours after the procedure or as directed by your health care  provider.  If your procedure was done as an outpatient procedure, which means that you went home the same day as your procedure, a responsible adult should be with you for the first 24 hours after you arrive home.  Keep all follow-up visits as directed by your health care provider. This is important. SEEK MEDICAL CARE IF:  You have a fever.  You have chills.  You have increased bleeding from the catheter insertion site. Hold pressure on the site. SEEK IMMEDIATE MEDICAL CARE IF:  You have unusual pain at the catheter insertion site.  You have redness, warmth, or swelling at the catheter insertion site.  You have drainage (other than a small amount of blood on the dressing) from the catheter insertion site.  The catheter insertion site is bleeding, and the bleeding does not stop after 30 minutes of holding steady pressure on the site.  The area near or just beyond the catheter insertion site becomes pale, cool, tingly, or numb.   This information is not intended to replace advice given to you by your health care provider. Make sure you discuss any questions you have with your health care provider.   Document Released: 11/19/2004 Document Revised: 05/24/2014 Document Reviewed: 10/04/2012 Elsevier Interactive Patient Education [employer].

## 2015-09-18 ENCOUNTER — Encounter: Payer: Self-pay | Admitting: Cardiology

## 2015-09-25 DIAGNOSIS — R0602 Shortness of breath: Secondary | ICD-10-CM | POA: Diagnosis not present

## 2015-09-25 DIAGNOSIS — I10 Essential (primary) hypertension: Secondary | ICD-10-CM | POA: Diagnosis not present

## 2015-09-25 DIAGNOSIS — E7800 Pure hypercholesterolemia, unspecified: Secondary | ICD-10-CM | POA: Diagnosis not present

## 2015-09-25 DIAGNOSIS — N183 Chronic kidney disease, stage 3 (moderate): Secondary | ICD-10-CM | POA: Diagnosis not present

## 2015-09-25 DIAGNOSIS — E1122 Type 2 diabetes mellitus with diabetic chronic kidney disease: Secondary | ICD-10-CM | POA: Diagnosis not present

## 2015-09-26 ENCOUNTER — Other Ambulatory Visit: Payer: Self-pay | Admitting: Family Medicine

## 2015-09-27 ENCOUNTER — Other Ambulatory Visit: Payer: Self-pay | Admitting: Family Medicine

## 2015-11-24 DIAGNOSIS — R0602 Shortness of breath: Secondary | ICD-10-CM | POA: Diagnosis not present

## 2015-11-24 DIAGNOSIS — G2581 Restless legs syndrome: Secondary | ICD-10-CM | POA: Diagnosis not present

## 2015-11-24 DIAGNOSIS — E663 Overweight: Secondary | ICD-10-CM | POA: Diagnosis not present

## 2015-12-18 ENCOUNTER — Other Ambulatory Visit: Payer: Self-pay | Admitting: *Deleted

## 2015-12-18 MED ORDER — FLUTICASONE PROPIONATE 50 MCG/ACT NA SUSP: 2.0000 | Freq: Every day | NASAL | 3 refills | Status: DC

## 2015-12-30 ENCOUNTER — Other Ambulatory Visit: Payer: Self-pay | Admitting: Family Medicine

## 2015-12-30 NOTE — Telephone Encounter (Deleted)
Electronic refill request. Last Filled:    90 tablet 0 09/29/2015  Note was made on last fill that appt must be scheduled prior to further refills.  None has been made.  Last office visit:   09/23/14.  Please advise.

## 2015-12-31 DIAGNOSIS — H401132 Primary open-angle glaucoma, bilateral, moderate stage: Secondary | ICD-10-CM | POA: Diagnosis not present

## 2016-01-07 DIAGNOSIS — K5732 Diverticulitis of large intestine without perforation or abscess without bleeding: Secondary | ICD-10-CM | POA: Insufficient documentation

## 2016-03-01 DIAGNOSIS — E1122 Type 2 diabetes mellitus with diabetic chronic kidney disease: Secondary | ICD-10-CM | POA: Diagnosis not present

## 2016-03-01 DIAGNOSIS — I10 Essential (primary) hypertension: Secondary | ICD-10-CM | POA: Diagnosis not present

## 2016-03-01 DIAGNOSIS — N183 Chronic kidney disease, stage 3 (moderate): Secondary | ICD-10-CM | POA: Diagnosis not present

## 2016-03-01 DIAGNOSIS — Z23 Encounter for immunization: Secondary | ICD-10-CM | POA: Diagnosis not present

## 2016-03-01 DIAGNOSIS — E7800 Pure hypercholesterolemia, unspecified: Secondary | ICD-10-CM | POA: Diagnosis not present

## 2016-03-01 DIAGNOSIS — Z0000 Encounter for general adult medical examination without abnormal findings: Secondary | ICD-10-CM | POA: Diagnosis not present

## 2016-03-08 DIAGNOSIS — Z0000 Encounter for general adult medical examination without abnormal findings: Secondary | ICD-10-CM | POA: Diagnosis not present

## 2016-04-20 DIAGNOSIS — H401132 Primary open-angle glaucoma, bilateral, moderate stage: Secondary | ICD-10-CM | POA: Diagnosis not present

## 2016-04-27 DIAGNOSIS — H401132 Primary open-angle glaucoma, bilateral, moderate stage: Secondary | ICD-10-CM | POA: Diagnosis not present

## 2016-08-25 DIAGNOSIS — H401132 Primary open-angle glaucoma, bilateral, moderate stage: Secondary | ICD-10-CM | POA: Diagnosis not present

## 2016-08-30 DIAGNOSIS — E1122 Type 2 diabetes mellitus with diabetic chronic kidney disease: Secondary | ICD-10-CM | POA: Diagnosis not present

## 2016-08-30 DIAGNOSIS — N183 Chronic kidney disease, stage 3 (moderate): Secondary | ICD-10-CM | POA: Diagnosis not present

## 2016-08-30 DIAGNOSIS — E7800 Pure hypercholesterolemia, unspecified: Secondary | ICD-10-CM | POA: Diagnosis not present

## 2016-08-30 DIAGNOSIS — I10 Essential (primary) hypertension: Secondary | ICD-10-CM | POA: Diagnosis not present

## 2016-11-10 DIAGNOSIS — J069 Acute upper respiratory infection, unspecified: Secondary | ICD-10-CM | POA: Diagnosis not present

## 2017-02-17 DIAGNOSIS — H401132 Primary open-angle glaucoma, bilateral, moderate stage: Secondary | ICD-10-CM | POA: Diagnosis not present

## 2017-02-26 ENCOUNTER — Other Ambulatory Visit: Payer: Self-pay | Admitting: Family Medicine

## 2017-03-09 DIAGNOSIS — E1122 Type 2 diabetes mellitus with diabetic chronic kidney disease: Secondary | ICD-10-CM | POA: Diagnosis not present

## 2017-03-09 DIAGNOSIS — E7800 Pure hypercholesterolemia, unspecified: Secondary | ICD-10-CM | POA: Diagnosis not present

## 2017-03-09 DIAGNOSIS — Z0000 Encounter for general adult medical examination without abnormal findings: Secondary | ICD-10-CM | POA: Diagnosis not present

## 2017-03-09 DIAGNOSIS — I10 Essential (primary) hypertension: Secondary | ICD-10-CM | POA: Diagnosis not present

## 2017-03-09 DIAGNOSIS — N183 Chronic kidney disease, stage 3 (moderate): Secondary | ICD-10-CM | POA: Diagnosis not present

## 2017-03-16 DIAGNOSIS — Z0000 Encounter for general adult medical examination without abnormal findings: Secondary | ICD-10-CM | POA: Diagnosis not present

## 2017-08-16 ENCOUNTER — Encounter: Payer: Self-pay | Admitting: Nurse Practitioner

## 2017-08-16 ENCOUNTER — Ambulatory Visit (INDEPENDENT_AMBULATORY_CARE_PROVIDER_SITE_OTHER): Payer: Medicare HMO | Admitting: Nurse Practitioner

## 2017-08-16 VITALS — BP 130/49 | HR 59 | Resp 16 | Ht 63.0 in | Wt 171.0 lb

## 2017-08-16 DIAGNOSIS — R7301 Impaired fasting glucose: Secondary | ICD-10-CM | POA: Diagnosis not present

## 2017-08-16 DIAGNOSIS — M545 Low back pain, unspecified: Secondary | ICD-10-CM

## 2017-08-16 DIAGNOSIS — Z1239 Encounter for other screening for malignant neoplasm of breast: Secondary | ICD-10-CM

## 2017-08-16 DIAGNOSIS — I10 Essential (primary) hypertension: Secondary | ICD-10-CM

## 2017-08-16 DIAGNOSIS — Z1231 Encounter for screening mammogram for malignant neoplasm of breast: Secondary | ICD-10-CM

## 2017-08-16 DIAGNOSIS — E559 Vitamin D deficiency, unspecified: Secondary | ICD-10-CM | POA: Diagnosis not present

## 2017-08-16 DIAGNOSIS — D485 Neoplasm of uncertain behavior of skin: Secondary | ICD-10-CM

## 2017-08-16 DIAGNOSIS — S300XXA Contusion of lower back and pelvis, initial encounter: Secondary | ICD-10-CM

## 2017-08-16 NOTE — Progress Notes (Signed)
[facility] [place], [place] [address]  Internal MEDICINE  Office Visit Note  Patient Name: Stacey Boyd  [id number]  [id number]  Date of Service: 09/09/2017   Complaints/HPI Pt is here for establishment of PCP.  The patient is here to establish primary care. She fell backwards into her bathtumb, landing on her tailbone. She had previously fractured the tailbone years before, but has not had any issues with it. Pain is excruciating. Unable to sit comfortably. Has to lean forward to keep weight off her bottom. Having trouble sleeping, as it hurts to lay on either side. Does take some ibuprofen which will take the edge of the pain, but does not take the pain away. Did have rx for voltaren gel, but no longer has this as her insurance will no longer pay for this.  Patient is concerned about spot on her skin, right shoulder. This gets irritated with flakiness and blisters sometimes. Has not had treatment for this. Also has skin tags along the underside of both breasts. These get red and tender in the summertime. Cause a great deal of discomfort . The patient is also concerned about difficulty sleeping. She hears voices, people yelling at night, playing very loud mucic. This occurred in her previous residence. She moved, and now, 3 weeks after moving, it is happening again. She called police, who checked on her next door neighbors, confirmed that everything was ok. The patient states that loud noises and music started again directly after that. She is now staying with her son. Will not go back to her home. She does not hear the loud music or people yelling at night. She is a little afraid there may "something wrong with her mind." Her grand daughter did come back into the room while patient was checking out, to express concern about the patient developing dementia.    Current Medication: Outpatient Encounter Medications as of 08/16/2017  Medication Sig  . atorvastatin  (LIPITOR) 10 MG tablet TAKE 1 TABLET (10 MG TOTAL) BY MOUTH DAILY.  Marland Kitchen azelastine (ASTELIN) 0.1 % nasal spray PLACE 2 SPRAYS INTO BOTH NOSTRILS 2 (TWO) TIMES DAILY  . Cholecalciferol (VITAMIN D3) 5000 units TABS Take by mouth.  . fexofenadine (ALLEGRA) 180 MG tablet Take 180 mg by mouth daily.  Marland Kitchen ibuprofen (ADVIL,MOTRIN) 200 MG tablet Take 400 mg by mouth every 6 (six) hours as needed for pain. Reported on 09/17/2015  . latanoprost (XALATAN) 0.005 % ophthalmic solution Place 1 drop into both eyes at bedtime.  Marland Kitchen lisinopril-hydrochlorothiazide (PRINZIDE,ZESTORETIC) 20-12.5 MG tablet Take 1 tablet by mouth daily. MUST SCHEDULE ANNUAL EXAM FOR FURTHER REFILLS  . Multiple Vitamins-Minerals (PRESERVISION AREDS 2) CAPS Take 1 capsule by mouth daily.  . propranolol (INDERAL) 40 MG tablet Take 20 mg by mouth 2 (two) times daily.  Marland Kitchen rOPINIRole (REQUIP) 2 MG tablet TAKE 1 TABLET (2 MG TOTAL) BY MOUTH NIGHTLY.  . timolol (TIMOPTIC) 0.5 % ophthalmic solution INSTILL ONE DROP INTO BOTH EYES DAILY  . [DISCONTINUED] cholecalciferol (VITAMIN D) 1000 UNITS tablet Take 4,000 Units by mouth daily.  . [DISCONTINUED] diclofenac sodium (VOLTAREN) 1 % GEL Apply topically 4 (four) times daily.  . [DISCONTINUED] fluticasone (FLONASE) 50 MCG/ACT nasal spray Place 2 sprays into both nostrils daily. (Patient not taking: Reported on 08/16/2017)   No facility-administered encounter medications on file as of 08/16/2017.     Surgical History: Past Surgical History:  Procedure Laterality Date  . ABDOMINAL HYSTERECTOMY  2005  . APPENDECTOMY  1980  .  CARDIAC CATHETERIZATION Left 09/17/2015   Procedure: Left Heart Cath and Coronary Angiography;  Surgeon: Teodoro Spray, MD;  Location: [facility] [facility];  Service: Cardiovascular;  Laterality: Left;    Medical History: Past Medical History:  Diagnosis Date  . Allergy   . Arthritis   . Diabetes mellitus without complication ([place])   . Glaucoma   . Hyperlipidemia   .  Hypertension   . Wrist fracture 2001   right    Family History: Family History  Problem Relation Age of Onset  . Heart disease Mother        h/o rheumatic fever  . Heart disease Father   . Stroke Father   . Diabetes Sister   . Diabetes Brother   . Diabetes Sister   . Colon cancer Neg Hx   . Breast cancer Neg Hx     Social History   Socioeconomic History  . Marital status: Single    Spouse name: Not on file  . Number of children: Not on file  . Years of education: Not on file  . Highest education level: Not on file  Occupational History  . Not on file  Social Needs  . Financial resource strain: Not on file  . Food insecurity:    Worry: Not on file    Inability: Not on file  . Transportation needs:    Medical: Not on file    Non-medical: Not on file  Tobacco Use  . Smoking status: Former Smoker    Last attempt to quit: 09/17/1979    Years since quitting: 38.0  . Smokeless tobacco: Never Used  Substance and Sexual Activity  . Alcohol use: No  . Drug use: No  . Sexual activity: Not on file  Lifestyle  . Physical activity:    Days per week: Not on file    Minutes per session: Not on file  . Stress: Not on file  Relationships  . Social connections:    Talks on phone: Not on file    Gets together: Not on file    Attends religious service: Not on file    Active member of club or organization: Not on file    Attends meetings of clubs or organizations: Not on file    Relationship status: Not on file  . Intimate partner violence:    Fear of current or ex partner: Not on file    Emotionally abused: Not on file    Physically abused: Not on file    Forced sexual activity: Not on file  Other Topics Concern  . Not on file  Social History Narrative   3 kids, local   Lives with daughter as of 2016   Divorced ~2002     Review of Systems  Constitutional: Positive for activity change. Negative for chills, fatigue and unexpected weight change.  HENT: Negative for  congestion, postnasal drip, rhinorrhea, sneezing, sore throat and voice change.   Eyes: Negative.  Negative for redness.  Respiratory: Negative for cough, chest tightness, shortness of breath and wheezing.   Cardiovascular: Negative for chest pain and palpitations.  Gastrointestinal: Negative for abdominal pain, constipation, diarrhea, nausea and vomiting.  Genitourinary: Negative for dysuria and frequency.  Musculoskeletal: Positive for arthralgias, back pain and myalgias. Negative for joint swelling and neck pain.  Skin: Negative for rash.  Neurological: Positive for weakness. Negative for tremors and numbness.       Believes she is having some issues with her memory.   Hematological: Negative for  adenopathy. Does not bruise/bleed easily.  Psychiatric/Behavioral: Positive for confusion, hallucinations and sleep disturbance. Negative for behavioral problems (Depression) and suicidal ideas. The patient is not nervous/anxious.     Today's Vitals   08/16/17 1432  BP: (!) 130/49  Pulse: (!) 59  Resp: 16  SpO2: 96%  Weight: 171 lb (77.6 kg)  Height: 5\' 3"  (1.6 m)   Physical Exam  Constitutional: She is oriented to person, place, and time. She appears well-developed and well-nourished. No distress.  HENT:  Head: Normocephalic and atraumatic.  Mouth/Throat: Oropharynx is clear and moist. No oropharyngeal exudate.  Eyes: Pupils are equal, round, and reactive to light. EOM are normal.  Neck: Normal range of motion. Neck supple. No JVD present. Carotid bruit is not present. No tracheal deviation present. No thyromegaly present.  Cardiovascular: Normal rate, regular rhythm, normal heart sounds and intact distal pulses. Exam reveals no gallop and no friction rub.  No murmur heard. Pulmonary/Chest: Effort normal and breath sounds normal. No respiratory distress. She has no wheezes. She has no rales. She exhibits no tenderness.  Abdominal: Soft. Bowel sounds are normal. There is no tenderness.   Musculoskeletal:  There is moderate to severe pain in the lower back and the tailbone. The patient has grimacing present with changes in position. Unable to find a comfortable seated position. No bruising, swelling, or palpable abnormalities are noted today.   Lymphadenopathy:    She has no cervical adenopathy.  Neurological: She is alert and oriented to person, place, and time. No cranial nerve deficit.  Skin: Skin is warm and dry. She is not diaphoretic.  Psychiatric: Her speech is normal and behavior is normal. Judgment normal. Her mood appears anxious. Thought content is paranoid and delusional. Cognition and memory are impaired. She exhibits a depressed mood. She expresses no homicidal and no suicidal ideation.  Nursing note and vitals reviewed.    Assessment/Plan:  1. Acute midline low back pain without sciatica - DG Lumbar Spine Complete; Future  2. Sacral bruising, initial encounter - DG Sacrum/Coccyx; Future  3. Essential hypertension, benign - CBC with Differential/Platelet - TSH - T4, free - Lipid panel  4. Neoplasm of uncertain behavior of skin - Ambulatory referral to Dermatology  5. Impaired fasting glucose - Comprehensive metabolic panel - TSH - T4, free - Lipid panel - Microalbumin / creatinine urine ratio  6. Vitamin D deficiency - Vitamin D 1,25 dihydroxy  7. Screening for breast cancer - MM Digital Screening; Future  General Counseling: shakyia bosso understanding of the findings of todays visit and agrees with plan of treatment. I have discussed any further diagnostic evaluation that may be needed or ordered today. We also reviewed her medications today. she has been encouraged to call the office with any questions or concerns that should arise related to todays visit.  This patient was seen by Leretha Pol, FNP- C in Collaboration with Dr Lavera Guise as a part of collaborative care agreement    Orders Placed This Encounter  Procedures  . MM  Digital Screening  . DG Sacrum/Coccyx  . DG Lumbar Spine Complete  . CBC with Differential/Platelet  . Comprehensive metabolic panel  . TSH  . T4, free  . Lipid panel  . Vitamin D 1,25 dihydroxy  . Microalbumin / creatinine urine ratio  . Ambulatory referral to Dermatology    Time spent: 30 Minutes

## 2017-08-22 ENCOUNTER — Other Ambulatory Visit: Payer: Self-pay | Admitting: Nurse Practitioner

## 2017-08-24 LAB — CBC
HEMOGLOBIN: 12.4 g/dL (ref 11.1–15.9)
Hematocrit: 36.8 % (ref 34.0–46.6)
MCH: 30.6 pg (ref 26.6–33.0)
MCHC: 33.7 g/dL (ref 31.5–35.7)
MCV: 91 fL (ref 79–97)
Platelets: 291 10*3/uL (ref 150–379)
RBC: 4.05 x10E6/uL (ref 3.77–5.28)
RDW: 13.2 % (ref 12.3–15.4)
WBC: 6.4 10*3/uL (ref 3.4–10.8)

## 2017-08-24 LAB — VITAMIN D 25 HYDROXY (VIT D DEFICIENCY, FRACTURES): VIT D 25 HYDROXY: 73.8 ng/mL (ref 30.0–100.0)

## 2017-08-24 LAB — URINALYSIS, ROUTINE W REFLEX MICROSCOPIC
Bilirubin, UA: NEGATIVE
Glucose, UA: NEGATIVE
Ketones, UA: NEGATIVE
LEUKOCYTES UA: NEGATIVE
Nitrite, UA: NEGATIVE
PH UA: 6 (ref 5.0–7.5)
Protein, UA: NEGATIVE
RBC UA: NEGATIVE
Specific Gravity, UA: 1.012 (ref 1.005–1.030)
Urobilinogen, Ur: 0.2 mg/dL (ref 0.2–1.0)

## 2017-08-24 LAB — COMPREHENSIVE METABOLIC PANEL
A/G RATIO: 2 (ref 1.2–2.2)
ALBUMIN: 4.5 g/dL (ref 3.5–4.8)
ALT: 16 IU/L (ref 0–32)
AST: 17 IU/L (ref 0–40)
Alkaline Phosphatase: 122 IU/L — ABNORMAL HIGH (ref 39–117)
BUN / CREAT RATIO: 18 (ref 12–28)
BUN: 19 mg/dL (ref 8–27)
Bilirubin Total: 0.4 mg/dL (ref 0.0–1.2)
CALCIUM: 9.6 mg/dL (ref 8.7–10.3)
CO2: 24 mmol/L (ref 20–29)
CREATININE: 1.07 mg/dL — AB (ref 0.57–1.00)
Chloride: 102 mmol/L (ref 96–106)
GFR calc Af Amer: 60 mL/min/{1.73_m2} (ref >59)
GFR, EST NON AFRICAN AMERICAN: 52 mL/min/{1.73_m2} — AB (ref >59)
GLOBULIN, TOTAL: 2.2 g/dL (ref 1.5–4.5)
Glucose: 119 mg/dL — ABNORMAL HIGH (ref 65–99)
Potassium: 4.7 mmol/L (ref 3.5–5.2)
SODIUM: 142 mmol/L (ref 134–144)
Total Protein: 6.7 g/dL (ref 6.0–8.5)

## 2017-08-24 LAB — LIPID PANEL W/O CHOL/HDL RATIO
Cholesterol, Total: 172 mg/dL (ref 100–199)
HDL: 41 mg/dL (ref >39)
LDL Calculated: 105 mg/dL — ABNORMAL HIGH (ref 0–99)
TRIGLYCERIDES: 129 mg/dL (ref 0–149)
VLDL Cholesterol Cal: 26 mg/dL (ref 5–40)

## 2017-08-24 LAB — TSH: TSH: 3.87 u[IU]/mL (ref 0.450–4.500)

## 2017-08-24 LAB — URINE CULTURE: Organism ID, Bacteria: NO GROWTH

## 2017-08-24 LAB — T4, FREE: Free T4: 1.36 ng/dL (ref 0.82–1.77)

## 2017-08-24 LAB — HGB A1C W/O EAG: HEMOGLOBIN A1C: 6.5 % — AB (ref 4.8–5.6)

## 2017-09-01 ENCOUNTER — Telehealth: Payer: Self-pay | Admitting: Nurse Practitioner

## 2017-09-01 ENCOUNTER — Ambulatory Visit
Admission: RE | Admit: 2017-09-01 | Discharge: 2017-09-01 | Disposition: A | Discharge status: Home / Self Care | Payer: Medicare HMO | Source: Ambulatory Visit | Attending: Nurse Practitioner | Admitting: Nurse Practitioner

## 2017-09-01 DIAGNOSIS — M545 Low back pain, unspecified: Secondary | ICD-10-CM

## 2017-09-01 DIAGNOSIS — S300XXA Contusion of lower back and pelvis, initial encounter: Secondary | ICD-10-CM | POA: Diagnosis not present

## 2017-09-01 DIAGNOSIS — W19XXXA Unspecified fall, initial encounter: Secondary | ICD-10-CM | POA: Diagnosis not present

## 2017-09-01 DIAGNOSIS — R935 Abnormal findings on diagnostic imaging of other abdominal regions, including retroperitoneum: Secondary | ICD-10-CM | POA: Diagnosis not present

## 2017-09-01 NOTE — Telephone Encounter (Signed)
Can you please let the patient know that I reviewed her x-ray. No spinal abnormality or fracture was seen. There is an incidental finding of some calcifications in the right lower abdomen which could be contributing to the pain. I would like to get her set up for CT abdomen and pelvis for further evaluation. Thanks **does not have to be STAT but should be urgent. thanks

## 2017-09-05 ENCOUNTER — Telehealth: Payer: Self-pay

## 2017-09-05 ENCOUNTER — Other Ambulatory Visit: Payer: Self-pay | Admitting: Nurse Practitioner

## 2017-09-05 DIAGNOSIS — R1031 Right lower quadrant pain: Secondary | ICD-10-CM

## 2017-09-05 NOTE — Telephone Encounter (Signed)
Patient has been advised of her xray results and also provided an appointment date for her CT abd/pelvis which is scheduled for 09/09/17 @ 11:00 KP/Tat

## 2017-09-08 ENCOUNTER — Ambulatory Visit
Admission: RE | Admit: 2017-09-08 | Discharge: 2017-09-08 | Disposition: A | Discharge status: Home / Self Care | Payer: Medicare HMO | Source: Ambulatory Visit | Attending: Nurse Practitioner | Admitting: Nurse Practitioner

## 2017-09-08 DIAGNOSIS — Z1231 Encounter for screening mammogram for malignant neoplasm of breast: Secondary | ICD-10-CM | POA: Diagnosis present

## 2017-09-08 DIAGNOSIS — Z1239 Encounter for other screening for malignant neoplasm of breast: Secondary | ICD-10-CM

## 2017-09-09 ENCOUNTER — Ambulatory Visit
Admission: RE | Admit: 2017-09-09 | Discharge: 2017-09-09 | Disposition: A | Discharge status: Home / Self Care | Payer: Medicare HMO | Source: Ambulatory Visit | Attending: Nurse Practitioner | Admitting: Nurse Practitioner

## 2017-09-09 DIAGNOSIS — R7301 Impaired fasting glucose: Secondary | ICD-10-CM | POA: Insufficient documentation

## 2017-09-09 DIAGNOSIS — I700 Atherosclerosis of aorta: Secondary | ICD-10-CM | POA: Diagnosis not present

## 2017-09-09 DIAGNOSIS — D485 Neoplasm of uncertain behavior of skin: Secondary | ICD-10-CM | POA: Insufficient documentation

## 2017-09-09 DIAGNOSIS — K5730 Diverticulosis of large intestine without perforation or abscess without bleeding: Secondary | ICD-10-CM | POA: Insufficient documentation

## 2017-09-09 DIAGNOSIS — R1031 Right lower quadrant pain: Secondary | ICD-10-CM | POA: Insufficient documentation

## 2017-09-09 DIAGNOSIS — S300XXA Contusion of lower back and pelvis, initial encounter: Secondary | ICD-10-CM | POA: Insufficient documentation

## 2017-09-09 DIAGNOSIS — M545 Low back pain, unspecified: Secondary | ICD-10-CM | POA: Insufficient documentation

## 2017-09-09 DIAGNOSIS — E559 Vitamin D deficiency, unspecified: Secondary | ICD-10-CM | POA: Insufficient documentation

## 2017-09-09 MED ORDER — IOPAMIDOL (ISOVUE-300) INJECTION 61%
100.0000 mL | Freq: Once | INTRAVENOUS | Status: AC | PRN
  Administered 2017-09-09: 100 mL via INTRAVENOUS

## 2017-09-12 ENCOUNTER — Telehealth: Payer: Self-pay | Admitting: Nurse Practitioner

## 2017-09-12 NOTE — Telephone Encounter (Signed)
Patient advised on Ct results and will be contacted with a referral to [facility] specialist

## 2017-10-04 ENCOUNTER — Encounter: Payer: Self-pay | Admitting: Nurse Practitioner

## 2017-10-10 ENCOUNTER — Encounter: Payer: Self-pay | Admitting: Emergency Medicine

## 2017-10-10 ENCOUNTER — Inpatient Hospital Stay
Admission: AD | Admit: 2017-10-10 | Discharge: 2017-10-15 | DRG: 885 | Disposition: A | Discharge status: Home / Self Care | Payer: Medicare HMO | Source: Intra-hospital | Attending: Psychiatry | Admitting: Psychiatry

## 2017-10-10 ENCOUNTER — Emergency Department: Payer: Medicare HMO

## 2017-10-10 ENCOUNTER — Other Ambulatory Visit: Payer: Self-pay

## 2017-10-10 ENCOUNTER — Emergency Department
Admission: EM | Admit: 2017-10-10 | Discharge: 2017-10-10 | Disposition: A | Discharge status: Other Acute Inpatient Hospital | Payer: Medicare HMO | Attending: Emergency Medicine | Admitting: Emergency Medicine

## 2017-10-10 DIAGNOSIS — H409 Unspecified glaucoma: Secondary | ICD-10-CM | POA: Diagnosis present

## 2017-10-10 DIAGNOSIS — F419 Anxiety disorder, unspecified: Secondary | ICD-10-CM | POA: Diagnosis not present

## 2017-10-10 DIAGNOSIS — F22 Delusional disorders: Secondary | ICD-10-CM

## 2017-10-10 DIAGNOSIS — Z87891 Personal history of nicotine dependence: Secondary | ICD-10-CM | POA: Insufficient documentation

## 2017-10-10 DIAGNOSIS — Z79899 Other long term (current) drug therapy: Secondary | ICD-10-CM | POA: Diagnosis not present

## 2017-10-10 DIAGNOSIS — F323 Major depressive disorder, single episode, severe with psychotic features: Secondary | ICD-10-CM | POA: Diagnosis not present

## 2017-10-10 DIAGNOSIS — E119 Type 2 diabetes mellitus without complications: Secondary | ICD-10-CM | POA: Diagnosis not present

## 2017-10-10 DIAGNOSIS — Z85828 Personal history of other malignant neoplasm of skin: Secondary | ICD-10-CM | POA: Insufficient documentation

## 2017-10-10 DIAGNOSIS — R739 Hyperglycemia, unspecified: Secondary | ICD-10-CM | POA: Diagnosis present

## 2017-10-10 DIAGNOSIS — Z8249 Family history of ischemic heart disease and other diseases of the circulatory system: Secondary | ICD-10-CM | POA: Diagnosis not present

## 2017-10-10 DIAGNOSIS — I10 Essential (primary) hypertension: Secondary | ICD-10-CM | POA: Diagnosis present

## 2017-10-10 DIAGNOSIS — Z046 Encounter for general psychiatric examination, requested by authority: Secondary | ICD-10-CM | POA: Diagnosis not present

## 2017-10-10 DIAGNOSIS — G4700 Insomnia, unspecified: Secondary | ICD-10-CM | POA: Diagnosis present

## 2017-10-10 DIAGNOSIS — E7800 Pure hypercholesterolemia, unspecified: Secondary | ICD-10-CM | POA: Diagnosis present

## 2017-10-10 DIAGNOSIS — Z833 Family history of diabetes mellitus: Secondary | ICD-10-CM | POA: Diagnosis not present

## 2017-10-10 DIAGNOSIS — M8580 Other specified disorders of bone density and structure, unspecified site: Secondary | ICD-10-CM | POA: Diagnosis present

## 2017-10-10 DIAGNOSIS — E785 Hyperlipidemia, unspecified: Secondary | ICD-10-CM | POA: Diagnosis present

## 2017-10-10 DIAGNOSIS — Z823 Family history of stroke: Secondary | ICD-10-CM | POA: Diagnosis not present

## 2017-10-10 DIAGNOSIS — F23 Brief psychotic disorder: Secondary | ICD-10-CM | POA: Diagnosis present

## 2017-10-10 DIAGNOSIS — R440 Auditory hallucinations: Secondary | ICD-10-CM | POA: Diagnosis present

## 2017-10-10 DIAGNOSIS — M1990 Unspecified osteoarthritis, unspecified site: Secondary | ICD-10-CM | POA: Diagnosis present

## 2017-10-10 DIAGNOSIS — F29 Unspecified psychosis not due to a substance or known physiological condition: Secondary | ICD-10-CM | POA: Diagnosis present

## 2017-10-10 LAB — URINALYSIS, COMPLETE (UACMP) WITH MICROSCOPIC
BILIRUBIN URINE: NEGATIVE
Bacteria, UA: NONE SEEN
Glucose, UA: NEGATIVE mg/dL
HGB URINE DIPSTICK: NEGATIVE
Ketones, ur: NEGATIVE mg/dL
NITRITE: NEGATIVE
Protein, ur: NEGATIVE mg/dL
SPECIFIC GRAVITY, URINE: 1.017 (ref 1.005–1.030)
pH: 5 (ref 5.0–8.0)

## 2017-10-10 LAB — CBC
HCT: 40.5 % (ref 35.0–47.0)
Hemoglobin: 13.9 g/dL (ref 12.0–16.0)
MCH: 31 pg (ref 26.0–34.0)
MCHC: 34.2 g/dL (ref 32.0–36.0)
MCV: 90.5 fL (ref 80.0–100.0)
Platelets: 279 10*3/uL (ref 150–440)
RBC: 4.48 MIL/uL (ref 3.80–5.20)
RDW: 12.8 % (ref 11.5–14.5)
WBC: 6.7 10*3/uL (ref 3.6–11.0)

## 2017-10-10 LAB — URINE DRUG SCREEN, QUALITATIVE (ARMC ONLY)
Amphetamines, Ur Screen: NOT DETECTED
BARBITURATES, UR SCREEN: NOT DETECTED
BENZODIAZEPINE, UR SCRN: POSITIVE — AB
Cannabinoid 50 Ng, Ur ~~LOC~~: NOT DETECTED
Cocaine Metabolite,Ur ~~LOC~~: NOT DETECTED
MDMA (ECSTASY) UR SCREEN: NOT DETECTED
Methadone Scn, Ur: NOT DETECTED
Opiate, Ur Screen: NOT DETECTED
Phencyclidine (PCP) Ur S: NOT DETECTED
Tricyclic, Ur Screen: NOT DETECTED

## 2017-10-10 LAB — COMPREHENSIVE METABOLIC PANEL
ALT: 19 U/L (ref 14–54)
AST: 24 U/L (ref 15–41)
Albumin: 4.8 g/dL (ref 3.5–5.0)
Alkaline Phosphatase: 83 U/L (ref 38–126)
Anion gap: 9 (ref 5–15)
BILIRUBIN TOTAL: 0.9 mg/dL (ref 0.3–1.2)
BUN: 29 mg/dL — AB (ref 6–20)
CO2: 26 mmol/L (ref 22–32)
CREATININE: 1.1 mg/dL — AB (ref 0.44–1.00)
Calcium: 9.2 mg/dL (ref 8.9–10.3)
Chloride: 106 mmol/L (ref 101–111)
GFR calc Af Amer: 56 mL/min — ABNORMAL LOW (ref >60)
GFR, EST NON AFRICAN AMERICAN: 49 mL/min — AB (ref >60)
Glucose, Bld: 151 mg/dL — ABNORMAL HIGH (ref 65–99)
Potassium: 3.7 mmol/L (ref 3.5–5.1)
Sodium: 141 mmol/L (ref 135–145)
TOTAL PROTEIN: 8 g/dL (ref 6.5–8.1)

## 2017-10-10 LAB — TSH: TSH: 6.283 u[IU]/mL — ABNORMAL HIGH (ref 0.350–4.500)

## 2017-10-10 LAB — SALICYLATE LEVEL: Salicylate Lvl: <7 mg/dL (ref 2.8–30.0)

## 2017-10-10 LAB — ACETAMINOPHEN LEVEL: Acetaminophen (Tylenol), Serum: <10 ug/mL — ABNORMAL LOW (ref 10–30)

## 2017-10-10 LAB — ETHANOL: Alcohol, Ethyl (B): <10 mg/dL (ref <10)

## 2017-10-10 MED ORDER — LISINOPRIL 20 MG PO TABS
20.0000 mg | ORAL_TABLET | Freq: Every day | ORAL | Status: DC
  Administered 2017-10-11 – 2017-10-14 (×4): 20 mg via ORAL
  Filled 2017-10-10 (×4): qty 1

## 2017-10-10 MED ORDER — LISINOPRIL 10 MG PO TABS: 20.0000 mg | ORAL_TABLET | Freq: Every day | ORAL | Status: DC

## 2017-10-10 MED ORDER — HYDROCHLOROTHIAZIDE 12.5 MG PO CAPS: 12.5000 mg | ORAL_CAPSULE | Freq: Every day | ORAL | Status: DC

## 2017-10-10 MED ORDER — MAGNESIUM HYDROXIDE 400 MG/5ML PO SUSP
30.0000 mL | Freq: Every day | ORAL | Status: DC | PRN
Start: 2017-10-10 — End: 2017-10-15

## 2017-10-10 MED ORDER — TRAZODONE HCL 100 MG PO TABS
100.0000 mg | ORAL_TABLET | Freq: Every evening | ORAL | Status: DC | PRN
  Administered 2017-10-11 – 2017-10-12 (×2): 100 mg via ORAL
  Filled 2017-10-10 (×3): qty 1

## 2017-10-10 MED ORDER — ATORVASTATIN CALCIUM 20 MG PO TABS: 10.0000 mg | ORAL_TABLET | Freq: Every day | ORAL | Status: DC

## 2017-10-10 MED ORDER — ARIPIPRAZOLE 2 MG PO TABS
2.0000 mg | ORAL_TABLET | Freq: Two times a day (BID) | ORAL | Status: DC
Start: 2017-10-10 — End: 2017-10-10

## 2017-10-10 MED ORDER — ATORVASTATIN CALCIUM 20 MG PO TABS
10.0000 mg | ORAL_TABLET | Freq: Every day | ORAL | Status: DC
  Administered 2017-10-10 – 2017-10-14 (×5): 10 mg via ORAL
  Filled 2017-10-10 (×5): qty 1

## 2017-10-10 MED ORDER — TIMOLOL MALEATE 0.5 % OP SOLN: 1.0000 [drp] | Freq: Every day | OPHTHALMIC | Status: DC

## 2017-10-10 MED ORDER — LATANOPROST 0.005 % OP SOLN: 1.0000 [drp] | Freq: Every day | OPHTHALMIC | Status: DC

## 2017-10-10 MED ORDER — LORATADINE 10 MG PO TABS: 10.0000 mg | ORAL_TABLET | Freq: Every day | ORAL | Status: DC

## 2017-10-10 MED ORDER — ALUM & MAG HYDROXIDE-SIMETH 200-200-20 MG/5ML PO SUSP: 30.0000 mL | ORAL | Status: DC | PRN

## 2017-10-10 MED ORDER — ARIPIPRAZOLE 2 MG PO TABS
2.0000 mg | ORAL_TABLET | Freq: Two times a day (BID) | ORAL | Status: DC
  Administered 2017-10-10 – 2017-10-12 (×4): 2 mg via ORAL
  Filled 2017-10-10 (×4): qty 1

## 2017-10-10 MED ORDER — HYDROCHLOROTHIAZIDE 12.5 MG PO CAPS
12.5000 mg | ORAL_CAPSULE | Freq: Every day | ORAL | Status: DC
  Administered 2017-10-11 – 2017-10-15 (×5): 12.5 mg via ORAL
  Filled 2017-10-10 (×5): qty 1

## 2017-10-10 MED ORDER — PROPRANOLOL HCL 20 MG PO TABS: 40.0000 mg | ORAL_TABLET | Freq: Two times a day (BID) | ORAL | Status: DC

## 2017-10-10 MED ORDER — PROPRANOLOL HCL 20 MG PO TABS
40.0000 mg | ORAL_TABLET | Freq: Two times a day (BID) | ORAL | Status: DC
  Filled 2017-10-10: qty 2

## 2017-10-10 MED ORDER — ACETAMINOPHEN 325 MG PO TABS
650.0000 mg | ORAL_TABLET | Freq: Four times a day (QID) | ORAL | Status: DC | PRN
Start: 2017-10-10 — End: 2017-10-15
  Administered 2017-10-11 – 2017-10-13 (×4): 650 mg via ORAL
  Filled 2017-10-10 (×3): qty 2

## 2017-10-10 MED ORDER — LATANOPROST 0.005 % OP SOLN
1.0000 [drp] | Freq: Every day | OPHTHALMIC | Status: DC
  Administered 2017-10-10 – 2017-10-14 (×5): 1 [drp] via OPHTHALMIC
  Filled 2017-10-10: qty 2.5

## 2017-10-10 MED ORDER — LORATADINE 10 MG PO TABS
10.0000 mg | ORAL_TABLET | Freq: Every day | ORAL | Status: DC
  Administered 2017-10-11 – 2017-10-15 (×5): 10 mg via ORAL
  Filled 2017-10-10 (×5): qty 1

## 2017-10-10 MED ORDER — TIMOLOL MALEATE 0.5 % OP SOLN
1.0000 [drp] | Freq: Every day | OPHTHALMIC | Status: DC
  Administered 2017-10-11 – 2017-10-15 (×5): 1 [drp] via OPHTHALMIC
  Filled 2017-10-10: qty 5

## 2017-10-10 NOTE — Consult Note (Signed)
[facility] Consult   Reason for Consult: Consult for 72 year old woman who came into the hospital complaining of persistent auditory hallucinations Referring Physician: Quale Patient Identification: Stacey Boyd MRN:  [id number] Principal Diagnosis: Severe major depression, single episode, with psychotic features [facility]) Diagnosis:   Patient Active Problem List   Diagnosis Date Noted  . Severe major depression, single episode, with psychotic features ([place]) [F32.3] 10/10/2017  . Acute midline low back pain without sciatica [M54.5] 09/09/2017  . Sacral bruising, initial encounter [S30.0XXA] 09/09/2017  . Neoplasm of uncertain behavior of skin [D48.5] 09/09/2017  . Impaired fasting glucose [R73.01] 09/09/2017  . Vitamin D deficiency [E55.9] 09/09/2017  . Osteopenia [M85.80] 11/15/2014  . Screening for breast cancer [Z12.31] 09/24/2014  . Elevated serum creatinine [R79.89] 09/24/2014  . Pain in joint, lower leg [M25.569] 09/20/2013  . Routine general medical examination at a health care facility [Z00.00] 09/20/2013  . Hyperglycemia [R73.9] 03/15/2013  . Essential hypertension, benign [I10] 03/15/2013  . Pure hypercholesterolemia [E78.00] 03/15/2013  . OA (osteoarthritis) [M19.90] 03/15/2013  . Glaucoma [H40.9] 03/15/2013    Total Time spent with patient: 1 hour  Subjective:   Stacey Boyd is a 72 y.o. female patient admitted with "the same thing just keeps on happening".  HPI: Patient seen chart reviewed.  Also spoke with emergency room physician and TTS.  75 year old woman brought to the hospital by her friend because of ongoing complaints of what are certainly hallucinations.  The patient tells a story that for most of the past year every place she goes she begins to hear sounds in the middle of the night which she believes are a crowd of Jehov[employer]es who moved into her next door neighbor's apartment and chant and seeing and scream and commit horrible crimes all  night long.  Patient has called the police who of course have found nothing.  She has confronted her neighbors who of course deny all of this.  Patient says that she moved twice in the last several months and at all 3 places the same thing is happening after a short period of time.  She claims her mood during the day is okay although she seems pretty anxious.  She is sleeping poorly most nights.  Claims that she is eating okay and taking care of her medical problems.  Patient admits that at times she has passively wishes that she would die although she denies any intent to kill herself.  Denies any homicidal ideation.  Denies substance abuse.  She has talked to several family members who have told her that there was nothing going on but she does not believe them.  She says that she has talked to her primary care doctor who told her that they would take care of it eventually but she has evidently not been started on psychiatric medicine.  Social history: Patient has adult children including a son who lives nearby.  She lives on her own and is currently in a retired persons living facility at [employer] in her own apartment.  Has a female companion friend who does not live with her but is here with her today.  Medical history: High blood pressure elevated cholesterol history of borderline diabetes but not on any medicine for it.  Substance abuse history: Denies any alcohol or drug abuse current or past  Past Psychiatric History: Patient has never seen a psychiatrist or therapist or mental health provider.  Never been in a psychiatric hospital.  Never prescribed any psychiatric medicine.  No history of psychosis no history of self injury or dangerousness.  Risk to Self: Suicidal Ideation: No Suicidal Intent: No Is patient at risk for suicide?: No Suicidal Plan?: No Access to Means: No What has been your use of drugs/alcohol within the last 12 months?: Pt denies use How many times?: 0 Other Self Harm  Risks: n/a Triggers for Past Attempts: None known Intentional Self Injurious Behavior: None Risk to Others: Homicidal Ideation: No Thoughts of Harm to Others: No Current Homicidal Intent: No Current Homicidal Plan: No Access to Homicidal Means: No Identified Victim: n/a History of harm to others?: No Assessment of Violence: None Noted Does patient have access to weapons?: No Criminal Charges Pending?: No Does patient have a court date: No Prior Inpatient Therapy: Prior Inpatient Therapy: No Prior Outpatient Therapy: Prior Outpatient Therapy: No Does patient have an ACCT team?: No Does patient have Intensive In-House Services?  : No Does patient have [facility] services? : No Does patient have P4CC services?: No  Past Medical History:  Past Medical History:  Diagnosis Date  . Allergy   . Arthritis   . Diabetes mellitus without complication ([place])    Pt states she is diet controlled.   . Glaucoma   . Hyperlipidemia   . Hypertension   . Wrist fracture 2001   right    Past Surgical History:  Procedure Laterality Date  . ABDOMINAL HYSTERECTOMY  2005  . APPENDECTOMY  1980  . CARDIAC CATHETERIZATION Left 09/17/2015   Procedure: Left Heart Cath and Coronary Angiography;  Surgeon: Teodoro Spray, MD;  Location: [facility];  Service: Cardiovascular;  Laterality: Left;   Family History:  Family History  Problem Relation Age of Onset  . Heart disease Mother        h/o rheumatic fever  . Heart disease Father   . Stroke Father   . Diabetes Sister   . Diabetes Brother   . Diabetes Sister   . Colon cancer Neg Hx   . Breast cancer Neg Hx    Family Psychiatric  History: She says she has a niece who had nerve problems. Social History:  Social History   Substance and Sexual Activity  Alcohol Use No     Social History   Substance and Sexual Activity  Drug Use No    Social History   Socioeconomic History  . Marital status: Divorced    Spouse name: Not on file  .  Number of children: Not on file  . Years of education: Not on file  . Highest education level: Not on file  Occupational History  . Not on file  Social Needs  . Financial resource strain: Not on file  . Food insecurity:    Worry: Not on file    Inability: Not on file  . Transportation needs:    Medical: Not on file    Non-medical: Not on file  Tobacco Use  . Smoking status: Former Smoker    Last attempt to quit: 09/17/1979    Years since quitting: 38.0  . Smokeless tobacco: Never Used  Substance and Sexual Activity  . Alcohol use: No  . Drug use: No  . Sexual activity: Not on file  Lifestyle  . Physical activity:    Days per week: Not on file    Minutes per session: Not on file  . Stress: Not on file  Relationships  . Social connections:    Talks on phone: Not on file    Gets together:  Not on file    Attends religious service: Not on file    Active member of club or organization: Not on file    Attends meetings of clubs or organizations: Not on file    Relationship status: Not on file  Other Topics Concern  . Not on file  Social History Narrative   3 kids, local   Lives with daughter as of 2016   Divorced ~2002   Additional Social History:    Allergies:   Allergies  Allergen Reactions  . Codeine Hives and Nausea And Vomiting    Labs:  Results for orders placed or performed during the hospital encounter of 10/10/17 (from the past 48 hour(s))  Comprehensive metabolic panel     Status: Abnormal   Collection Time: 10/10/17  8:04 AM  Result Value Ref Range   Sodium 141 135 - 145 mmol/L   Potassium 3.7 3.5 - 5.1 mmol/L   Chloride 106 101 - 111 mmol/L   CO2 26 22 - 32 mmol/L   Glucose, Bld 151 (H) 65 - 99 mg/dL   BUN 29 (H) 6 - 20 mg/dL   Creatinine, Ser 1.10 (H) 0.44 - 1.00 mg/dL   Calcium 9.2 8.9 - 10.3 mg/dL   Total Protein 8.0 6.5 - 8.1 g/dL   Albumin 4.8 3.5 - 5.0 g/dL   AST 24 15 - 41 U/L   ALT 19 14 - 54 U/L   Alkaline Phosphatase 83 38 - 126 U/L    Total Bilirubin 0.9 0.3 - 1.2 mg/dL   GFR calc non Af Amer 49 (L) >60 mL/min   GFR calc Af Amer 56 (L) >60 mL/min    Comment: (NOTE) The eGFR has been calculated using the CKD EPI equation. This calculation has not been validated in all clinical situations. eGFR's persistently <60 mL/min signify possible Chronic Kidney Disease.    Anion gap 9 5 - 15    Comment: Performed at [facility], [address] [place], [place] [address]  Ethanol     Status: None   Collection Time: 10/10/17  8:04 AM  Result Value Ref Range   Alcohol, Ethyl (B) <10 <10 mg/dL    Comment: (NOTE) Lowest detectable limit for serum alcohol is 10 mg/dL. For medical purposes only. Performed at [facility], [address] [place], [place] [address]   Salicylate level     Status: None   Collection Time: 10/10/17  8:04 AM  Result Value Ref Range   Salicylate Lvl <6.3 2.8 - 30.0 mg/dL    Comment: Performed at [facility], [address] [place], [place] [address]  Acetaminophen level     Status: Abnormal   Collection Time: 10/10/17  8:04 AM  Result Value Ref Range   Acetaminophen (Tylenol), Serum <10 (L) 10 - 30 ug/mL    Comment: (NOTE) Therapeutic concentrations vary significantly. A range of 10-30 ug/mL  may be an effective concentration for many patients. However, some  are best treated at concentrations outside of this range. Acetaminophen concentrations >150 ug/mL at 4 hours after ingestion  and >50 ug/mL at 12 hours after ingestion are often associated with  toxic reactions. Performed at [facility] [address] [place], [place] [address]   cbc     Status: None   Collection Time: 10/10/17  8:04 AM  Result Value Ref Range   WBC 6.7 3.6 - 11.0 K/uL   RBC 4.48 3.80 - 5.20 MIL/uL   Hemoglobin 13.9 12.0 - 16.0  g/dL   HCT 40.5 35.0 - 47.0 %   MCV 90.5 80.0 - 100.0 fL   MCH 31.0 26.0 - 34.0 pg   MCHC 34.2 32.0 - 36.0 g/dL   RDW 12.8 11.5 - 14.5 %    Platelets 279 150 - 440 K/uL    Comment: Performed at [facility], [address], [place], [place] [address]  Urine Drug Screen, Qualitative     Status: Abnormal   Collection Time: 10/10/17  8:04 AM  Result Value Ref Range   Tricyclic, Ur Screen NONE DETECTED NONE DETECTED   Amphetamines, Ur Screen NONE DETECTED NONE DETECTED   MDMA (Ecstasy)Ur Screen NONE DETECTED NONE DETECTED   Cocaine Metabolite,Ur [place] NONE DETECTED NONE DETECTED   Opiate, Ur Screen NONE DETECTED NONE DETECTED   Phencyclidine (PCP) Ur S NONE DETECTED NONE DETECTED   Cannabinoid 50 Ng, Ur [place] NONE DETECTED NONE DETECTED   Barbiturates, Ur Screen NONE DETECTED NONE DETECTED   Benzodiazepine, Ur Scrn POSITIVE (A) NONE DETECTED   Methadone Scn, Ur NONE DETECTED NONE DETECTED    Comment: (NOTE) Tricyclics + metabolites, urine    Cutoff 1000 ng/mL Amphetamines + metabolites, urine  Cutoff 1000 ng/mL MDMA (Ecstasy), urine              Cutoff 500 ng/mL Cocaine Metabolite, urine          Cutoff 300 ng/mL Opiate + metabolites, urine        Cutoff 300 ng/mL Phencyclidine (PCP), urine         Cutoff 25 ng/mL Cannabinoid, urine                 Cutoff 50 ng/mL Barbiturates + metabolites, urine  Cutoff 200 ng/mL Benzodiazepine, urine              Cutoff 200 ng/mL Methadone, urine                   Cutoff 300 ng/mL The urine drug screen provides only a preliminary, unconfirmed analytical test result and should not be used for non-medical purposes. Clinical consideration and professional judgment should be applied to any positive drug screen result due to possible interfering substances. A more specific alternate chemical method must be used in order to obtain a confirmed analytical result. Gas chromatography / mass spectrometry (GC/MS) is the preferred confirmat ory method. Performed at [facility], [address] [place], [place] [address]   Urinalysis, Complete w Microscopic     Status: Abnormal    Collection Time: 10/10/17  8:04 AM  Result Value Ref Range   Color, Urine YELLOW (A) YELLOW   APPearance CLEAR (A) CLEAR   Specific Gravity, Urine 1.017 1.005 - 1.030   pH 5.0 5.0 - 8.0   Glucose, UA NEGATIVE NEGATIVE mg/dL   Hgb urine dipstick NEGATIVE NEGATIVE   Bilirubin Urine NEGATIVE NEGATIVE   Ketones, ur NEGATIVE NEGATIVE mg/dL   Protein, ur NEGATIVE NEGATIVE mg/dL   Nitrite NEGATIVE NEGATIVE   Leukocytes, UA TRACE (A) NEGATIVE   RBC / HPF 0-5 0 - 5 RBC/hpf   WBC, UA 0-5 0 - 5 WBC/hpf   Bacteria, UA NONE SEEN NONE SEEN   Squamous Epithelial / LPF 0-5 0 - 5   Mucus PRESENT    Hyaline Casts, UA PRESENT     Comment: Performed at [facility], [address], [place], [place] [address]  TSH     Status: Abnormal   Collection Time: 10/10/17  8:04 AM  Result Value Ref Range   TSH 6.283 (H) 0.350 - 4.500 uIU/mL    Comment: Performed by a 3rd Generation assay with a functional sensitivity of <=0.01 uIU/mL. Performed at [facility], [address] [place], [place] [address]     Current Facility-Administered Medications  Medication Dose Route Frequency Provider Last Rate Last Dose  . ARIPiprazole (ABILIFY) tablet 2 mg  2 mg Oral BID Clapacs, John T, MD      . atorvastatin (LIPITOR) tablet 10 mg  10 mg Oral q1800 Clapacs, John T, MD      . hydrochlorothiazide (MICROZIDE) capsule 12.5 mg  12.5 mg Oral Daily Clapacs, John T, MD      . latanoprost (XALATAN) 0.005 % ophthalmic solution 1 drop  1 drop Both Eyes QHS Clapacs, John T, MD      . lisinopril (PRINIVIL,ZESTRIL) tablet 20 mg  20 mg Oral Daily Clapacs, John T, MD      . loratadine (CLARITIN) tablet 10 mg  10 mg Oral Daily Clapacs, John T, MD      . propranolol (INDERAL) tablet 40 mg  40 mg Oral BID Clapacs, John T, MD      . timolol (TIMOPTIC) 0.5 % ophthalmic solution 1 drop  1 drop Both Eyes Daily Clapacs, Madie [place], MD       Current Outpatient Medications  Medication Sig Dispense Refill  . atorvastatin  (LIPITOR) 10 MG tablet TAKE 1 TABLET (10 MG TOTAL) BY MOUTH DAILY. 90 tablet 0  . azelastine (ASTELIN) 0.1 % nasal spray PLACE 2 SPRAYS INTO BOTH NOSTRILS 2 (TWO) TIMES DAILY AS NEEDED FOR CONGESTION  11  . Cholecalciferol (VITAMIN D3) 5000 units TABS Take 5,000 Units by mouth daily.     . fexofenadine (ALLEGRA) 180 MG tablet Take 180 mg by mouth daily.    Marland Kitchen ibuprofen (ADVIL,MOTRIN) 200 MG tablet Take 400 mg by mouth every 6 (six) hours as needed for pain. Reported on 09/17/2015    . latanoprost (XALATAN) 0.005 % ophthalmic solution Place 1 drop into both eyes at bedtime.    Marland Kitchen lisinopril-hydrochlorothiazide (PRINZIDE,ZESTORETIC) 20-12.5 MG tablet Take 1 tablet by mouth daily. MUST SCHEDULE ANNUAL EXAM FOR FURTHER REFILLS 90 tablet 0  . Multiple Vitamins-Minerals (PRESERVISION AREDS 2) CAPS Take 1 capsule by mouth daily.    . propranolol (INDERAL) 40 MG tablet Take 20 mg by mouth 2 (two) times daily.    Marland Kitchen rOPINIRole (REQUIP) 2 MG tablet TAKE 1 TABLET (2 MG TOTAL) BY MOUTH NIGHTLY.  11  . timolol (TIMOPTIC) 0.5 % ophthalmic solution INSTILL ONE DROP INTO BOTH EYES DAILY  3    Musculoskeletal: Strength & Muscle Tone: within normal limits Gait & Station: normal Patient leans: N/A  Psychiatric Specialty Exam: Physical Exam  Nursing note and vitals reviewed. Constitutional: She appears well-developed and well-nourished.  HENT:  Head: Normocephalic and atraumatic.  Eyes: Pupils are equal, round, and reactive to light. Conjunctivae are normal.  Neck: Normal range of motion.  Cardiovascular: Regular rhythm and normal heart sounds.  Respiratory: Effort normal.  GI: Soft.  Musculoskeletal: Normal range of motion.  Neurological: She is alert.  Skin: Skin is warm and dry.  Psychiatric: Her speech is normal. Her mood appears anxious. Her affect is blunt. She is agitated. She is not aggressive. Thought content is paranoid and delusional. Cognition and memory are normal. She expresses impulsivity. She  expresses suicidal ideation. She expresses no suicidal plans.    Review of Systems  Constitutional: Negative.   HENT:  Negative.   Eyes: Negative.   Respiratory: Negative.   Cardiovascular: Negative.   Gastrointestinal: Negative.   Musculoskeletal: Negative.   Skin: Negative.   Neurological: Negative.   Psychiatric/Behavioral: Positive for hallucinations and suicidal ideas. Negative for depression, memory loss and substance abuse. The patient is nervous/anxious and has insomnia.     Blood pressure 113/61, pulse 69, temperature 98.3 F (36.8 C), temperature source Oral, resp. rate 18, height '5\' 3"'  (1.6 m), weight 77.1 kg (170 lb), SpO2 95 %.Body mass index is 30.11 kg/m.  General Appearance: Fairly Groomed  Eye Contact:  Fair  Speech:  Clear and Coherent  Volume:  Normal  Mood:  Anxious and Dysphoric  Affect:  Congruent  Thought Process:  Disorganized  Orientation:  Full (Time, Place, and Person)  Thought Content:  Illogical, Delusions and Hallucinations: Auditory  Suicidal Thoughts:  Yes.  without intent/plan  Homicidal Thoughts:  No  Memory:  Immediate;   Fair Recent;   Fair Remote;   Fair  Judgement:  Impaired  Insight:  Lacking  Psychomotor Activity:  Decreased  Concentration:  Concentration: Fair  Recall:  [employer] of Knowledge:  Fair  Language:  Fair  Akathisia:  No  Handed:  Right  AIMS (if indicated):     Assets:  Desire for Improvement Housing Physical Health Resilience Social Support  ADL's:  Intact  Cognition:  WNL  Sleep:        Treatment Plan Summary: Daily contact with patient to assess and evaluate symptoms and progress in treatment, Medication management and Plan 72 year old woman who is presenting to the hospital with complaints of several months of auditory hallucinations at night.  She is denying having depression although it does sound like she is sleeping very poorly and is feeling more and more nervous and sad and occasionally suicidal during  the day.  Patient is absolutely convinced of her interpretation of these hallucinations and will not consider that they may be unreal.  Patient is living by herself.  Not on any psychiatric medicine.  I suggested to her that the best way to treat these would be to get her into the hospital first to make sure she could tolerate and benefit from antipsychotic medicine.  Patient is agreeable to the plan.  Case reviewed with emergency room physician and TTS.  Continue her usual outpatient medicine for high blood pressure and cholesterol and other medical problems.  Start low-dose Abilify for psychosis.  I am giving her a diagnosis of major depression with psychosis for now given that we do not have a past history of other psychotic disorder and there is no sign of delirium or dementia or other medical problem that would be causing this.  Disposition: Recommend psychiatric Inpatient admission when medically cleared. Supportive therapy provided about ongoing stressors.  Alethia Berthold, MD 10/10/2017 3:11 PM

## 2017-10-10 NOTE — ED Notes (Signed)
Pt given sandwich tray at this time  

## 2017-10-10 NOTE — ED Notes (Signed)
TTS at bedside. 

## 2017-10-10 NOTE — ED Notes (Signed)
ED Provider at bedside. 

## 2017-10-10 NOTE — [facility] Group Notes (Signed)
[facility] Group Notes:  (Nursing/MHT/Case Management/Adjunct)  Date:  10/10/2017  Time:  9:54 PM  Type of Therapy:  Group Therapy  Participation Level:  Active  Participation Quality:  Appropriate  Affect:  Appropriate  Cognitive:  Appropriate  Insight:  Appropriate  Engagement in Group:  Engaged  Modes of Intervention:  Discussion  Summary of Progress/Problems:  Kandis Fantasia 10/10/2017, 9:54 PM

## 2017-10-10 NOTE — ED Provider Notes (Signed)
[facility] Emergency Department Provider Note   ____________________________________________   First MD Initiated Contact with Patient 10/10/17 [phone]     (approximate)  I have reviewed the triage vital signs and the nursing notes.   HISTORY  Chief Complaint People say I am hearing things that are not real   HPI Stacey Boyd is a 72 y.o. female reports that over the last 5 months she is lived in 2 different apartments now where "to man" she does not know I have followed along and lived in the apartment next door, seemingly piping abnormal music into her room at night.  She also reports she will hear people screaming off and on.  She reports she thinks the [employer] is trying to incite doctrine upon the women in her apartment complex.  She is told others about her to have told her that did not think which she was hearing was real, but she reports "I just seems so real".  She denies having history of this prior except that started about 4 months ago and she since moved to a different apartment where this has continued.  In addition she found out that she lost her best friend today, but reports that that something she received a call about just recently, but was on her way here to be evaluated before that anyway because this is causing so much anxiety in her life.  Denies she would ever want to hurt herself or anyone else.  She gets scared and feels concerned that the people are living above her next to her trying to pipe music into her room  Past Medical History:  Diagnosis Date  . Allergy   . Arthritis   . Diabetes mellitus without complication ([place])    Pt states she is diet controlled.   . Glaucoma   . Hyperlipidemia   . Hypertension   . Wrist fracture 2001   right    Patient Active Problem List   Diagnosis Date Noted  . Acute midline low back pain without sciatica 09/09/2017  . Sacral bruising, initial encounter 09/09/2017  .  Neoplasm of uncertain behavior of skin 09/09/2017  . Impaired fasting glucose 09/09/2017  . Vitamin D deficiency 09/09/2017  . Osteopenia 11/15/2014  . Screening for breast cancer 09/24/2014  . Elevated serum creatinine 09/24/2014  . Pain in joint, lower leg 09/20/2013  . Routine general medical examination at a health care facility 09/20/2013  . Hyperglycemia 03/15/2013  . Essential hypertension, benign 03/15/2013  . Pure hypercholesterolemia 03/15/2013  . OA (osteoarthritis) 03/15/2013  . Glaucoma 03/15/2013    Past Surgical History:  Procedure Laterality Date  . ABDOMINAL HYSTERECTOMY  2005  . APPENDECTOMY  1980  . CARDIAC CATHETERIZATION Left 09/17/2015   Procedure: Left Heart Cath and Coronary Angiography;  Surgeon: Teodoro Spray, MD;  Location: [place] [facility];  Service: Cardiovascular;  Laterality: Left;    Prior to Admission medications   Medication Sig Start Date End Date Taking? Authorizing Provider  atorvastatin (LIPITOR) 10 MG tablet TAKE 1 TABLET (10 MG TOTAL) BY MOUTH DAILY. 09/29/15  Yes Tonia Ghent, MD  azelastine (ASTELIN) 0.1 % nasal spray PLACE 2 SPRAYS INTO BOTH NOSTRILS 2 (TWO) TIMES DAILY AS NEEDED FOR CONGESTION 05/17/17  Yes [provider]  Cholecalciferol (VITAMIN D3) 5000 units TABS Take 5,000 Units by mouth daily.    Yes [provider]  fexofenadine (ALLEGRA) 180 MG tablet Take 180 mg by mouth daily.   Yes  [provider]  ibuprofen (ADVIL,MOTRIN) 200 MG tablet Take 400 mg by mouth every 6 (six) hours as needed for pain. Reported on 09/17/2015   Yes [provider]  latanoprost (XALATAN) 0.005 % ophthalmic solution Place 1 drop into both eyes at bedtime.   Yes [provider]  lisinopril-hydrochlorothiazide (PRINZIDE,ZESTORETIC) 20-12.5 MG tablet Take 1 tablet by mouth daily. MUST SCHEDULE ANNUAL EXAM FOR FURTHER REFILLS 09/26/15  Yes Tonia Ghent, MD  Multiple Vitamins-Minerals (PRESERVISION AREDS 2)  CAPS Take 1 capsule by mouth daily.   Yes [provider]  propranolol (INDERAL) 40 MG tablet Take 20 mg by mouth 2 (two) times daily.   Yes [provider]  rOPINIRole (REQUIP) 2 MG tablet TAKE 1 TABLET (2 MG TOTAL) BY MOUTH NIGHTLY. 06/26/17  Yes [provider]  timolol (TIMOPTIC) 0.5 % ophthalmic solution INSTILL ONE DROP INTO BOTH EYES DAILY 05/21/17  Yes [provider]    Allergies Codeine  Family History  Problem Relation Age of Onset  . Heart disease Mother        h/o rheumatic fever  . Heart disease Father   . Stroke Father   . Diabetes Sister   . Diabetes Brother   . Diabetes Sister   . Colon cancer Neg Hx   . Breast cancer Neg Hx     Social History Social History   Tobacco Use  . Smoking status: Former Smoker    Last attempt to quit: 09/17/1979    Years since quitting: 38.0  . Smokeless tobacco: Never Used  Substance Use Topics  . Alcohol use: No  . Drug use: No    Review of Systems Constitutional: No fever/chills Eyes: No visual changes. ENT: No sore throat. Cardiovascular: Denies chest pain. Respiratory: Denies shortness of breath. Gastrointestinal: No abdominal pain.  No nausea, no vomiting.  No diarrhea.  No constipation. Genitourinary: Negative for dysuria. Musculoskeletal: Negative for back pain. Skin: Negative for rash. Neurological: Negative for headaches, focal weakness or numbness. Psychiatric: Denies visual hallucinations, but reports hearing things nightly, including things are making her very paranoid appearing hearing people screaming.  People piping music in her home.    ____________________________________________   PHYSICAL EXAM:  VITAL SIGNS: ED Triage Vitals  Enc Vitals Group     BP 10/10/17 0751 (!) 172/67     Pulse Rate 10/10/17 0751 86     Resp 10/10/17 0751 18     Temp 10/10/17 0751 98.3 F (36.8 C)     Temp Source 10/10/17 0751 Oral     SpO2 10/10/17 0751 95 %     Weight 10/10/17 0751  170 lb (77.1 kg)     Height 10/10/17 0751 5\' 3"  (1.6 m)     Head Circumference --      Peak Flow --      Pain Score 10/10/17 0755 0     Pain Loc --      Pain Edu? --      Excl. in [place]? --     Constitutional: Alert and oriented. Well appearing and in no acute distress.  She does appear slightly anxious.  She is very pleasant. Eyes: Conjunctivae are normal. Head: Atraumatic. Nose: No congestion/rhinnorhea. Mouth/Throat: Mucous membranes are moist. Neck: No stridor.   Cardiovascular: Normal rate, regular rhythm. Grossly normal heart sounds.  Good peripheral circulation. Respiratory: Normal respiratory effort.  No retractions. Lungs CTAB. Gastrointestinal: Soft and nontender. No distention. Musculoskeletal: No lower extremity tenderness nor edema. Neurologic:  Normal speech and  language. No gross focal neurologic deficits are appreciated.  Skin:  Skin is warm, dry and intact. No rash noted. Psychiatric: Mood and affect are calm and spoken to, but she comes tearful and anxious that she talks about the noise that she is hearing in her home at night.  ____________________________________________   LABS (all labs ordered are listed, but only abnormal results are displayed)  Labs Reviewed  COMPREHENSIVE METABOLIC PANEL - Abnormal; Notable for the following components:      Result Value   Glucose, Bld 151 (*)    BUN 29 (*)    Creatinine, Ser 1.10 (*)    GFR calc non Af Amer 49 (*)    GFR calc Af Amer 56 (*)    All other components within normal limits  ACETAMINOPHEN LEVEL - Abnormal; Notable for the following components:   Acetaminophen (Tylenol), Serum <10 (*)    All other components within normal limits  URINE DRUG SCREEN, QUALITATIVE ([facility] ONLY) - Abnormal; Notable for the following components:   Benzodiazepine, Ur Scrn POSITIVE (*)    All other components within normal limits  URINALYSIS, COMPLETE (UACMP) WITH MICROSCOPIC - Abnormal; Notable for the following components:    Color, Urine YELLOW (*)    APPearance CLEAR (*)    Leukocytes, UA TRACE (*)    All other components within normal limits  TSH - Abnormal; Notable for the following components:   TSH 6.283 (*)    All other components within normal limits  URINE CULTURE  ETHANOL  SALICYLATE LEVEL  CBC   ____________________________________________  EKG   ____________________________________________  RADIOLOGY  CT the head negative for acute ____________________________________________   PROCEDURES  Procedure(s) performed: None  Procedures  Critical Care performed: No  ____________________________________________   INITIAL IMPRESSION / ASSESSMENT AND PLAN / ED COURSE  Pertinent labs & imaging results that were available during my care of the patient were reviewed by me and considered in my medical decision making (see chart for details).  Patient presents for evaluation of evidently increasing concerns of loud music, hearing things, and knowing that there were 2 men following her from one apartment complex to the other playing loud music and trying to recruit people to the [place].  She seems somewhat hyperreligious, she does appear paranoid when she speaks of these things, she does not appear to represent any harm to herself or others.  She adamantly denies that she would harm herself or anyone else, but she reports is becoming scared of the things that she hears during the night.      ----------------------------------------- 1:19 PM on 10/10/2017 -----------------------------------------  Discussed case with Dr. Weber Cooks, planning to admit the patient voluntarily to psychiatry for ongoing care.  ____________________________________________   FINAL CLINICAL IMPRESSION(S) / ED DIAGNOSES  Final diagnoses:  Paranoia ([place])      NEW MEDICATIONS STARTED DURING THIS VISIT:  New Prescriptions   No medications on file     Note:  This document was prepared using  Dragon voice recognition software and may include unintentional dictation errors.     Delman Kitten, MD 10/10/17 1320

## 2017-10-10 NOTE — ED Notes (Signed)
Patient transported to CT 

## 2017-10-10 NOTE — Consult Note (Signed)
Psychiatry: Brief note, full note to follow.  72 year old woman with no significant known past psychiatric history with psychotic symptoms.  Orders completed for admission to psychiatric unit.

## 2017-10-10 NOTE — ED Notes (Signed)
Family at bedside. 

## 2017-10-10 NOTE — ED Notes (Signed)
Pt in NAD at time of trasnfer to [facility], VSS, pt in wheelchair with this RN to [facility] unit

## 2017-10-10 NOTE — Tx Team (Signed)
Initial Treatment Plan 10/10/2017 5:56 PM Stacey Boyd YEL:[id number]    PATIENT STRESSORS: Other: hearing noises from neighbor and believing people are trying to kill her   PATIENT STRENGTHS: Ability for insight Active sense of humor Average or above average intelligence Capable of independent living Communication skills Motivation for treatment/growth Physical Health   PATIENT IDENTIFIED PROBLEMS: Trying to prevent other people from being hurt.                     DISCHARGE CRITERIA:  Ability to meet basic life and health needs Adequate post-discharge living arrangements Improved stabilization in mood, thinking, and/or behavior  PRELIMINARY DISCHARGE PLAN: Return to previous living arrangement  PATIENT/FAMILY INVOLVEMENT: This treatment plan has been presented to and reviewed with the patient, Stacey Boyd.  The patient has been given the opportunity to ask questions and make suggestions.  Roxy Manns, RN 10/10/2017, 5:56 PM

## 2017-10-10 NOTE — ED Notes (Signed)
Per pt request for Stacey Boyd , family  to take belongings and purse with him home.

## 2017-10-10 NOTE — BH Assessment (Signed)
Assessment Note  Stacey Boyd is an 72 y.o. female who presents to the ED with c/o A/V hallucinations. Pt reports that when she was home this morning she heard 2 men talking to the upstairs neighbor saying that they were coming to kill her (the pt). She states, "The chickens they wait until night time. They are trying to get my door open, I can hear them talking. They want to kill me. I recognize the two men's and the lady's voices from when we lived on [address]. You know that's when all of this got started when we lived on [address] [place]. There were two women that lived next door and they had men, lots of men, coming in all day. Some of the men slept over you know. And the men would have Anal sex with them and they would scream so loud I couldn't sleep. Then the one woman got to bleeding to I called the police, and honey she got so angry with me. So we moved to [facility] and there is a minister lady that lives upstairs and I'll tell you what I've never heard screaming so loud and so bad before but that minister lady screams so bad. So this morning I heard the two men at her door and I heard the one man say "Do you know [place]? I'm going to kill her". Then I heard them trying to open my door and I held the lock so tight so they could not get it, then they used a credit card and I could see the credit card coming through the door. They were trying to pretend to be the maintenance man but I know they  don't work today because it's a Holiday." Pt referenced "anal sex" several time during the assessment and stated that the church has anal sex with the choir members if they don't get the songs right.   Pt reports that this has been going on for weeks now and that she has only been in her new apartment for about two weeks. She states that her roommate is no longer comfortable living with her because he thinks she is making everything up. She reports being afraid to go home for fear that the men will get into her  home and kill her. Pt reports that her best friend died this morning. Pt was tearful while talking but never actually created wet tears. Pt was calm and cooperative, but also very paranoid and talkative during the assessment. She had to be redirected back to the original question several times. Pt denies SI/HI.  Diagnosis: Anxiety  Past Medical History:  Past Medical History:  Diagnosis Date  . Allergy   . Arthritis   . Diabetes mellitus without complication ([place])    Pt states she is diet controlled.   . Glaucoma   . Hyperlipidemia   . Hypertension   . Wrist fracture 2001   right    Past Surgical History:  Procedure Laterality Date  . ABDOMINAL HYSTERECTOMY  2005  . APPENDECTOMY  1980  . CARDIAC CATHETERIZATION Left 09/17/2015   Procedure: Left Heart Cath and Coronary Angiography;  Surgeon: Teodoro Spray, MD;  Location: [place] [facility];  Service: Cardiovascular;  Laterality: Left;    Family History:  Family History  Problem Relation Age of Onset  . Heart disease Mother        h/o rheumatic fever  . Heart disease Father   . Stroke Father   . Diabetes Sister   .  Diabetes Brother   . Diabetes Sister   . Colon cancer Neg Hx   . Breast cancer Neg Hx     Social History:  reports that she quit smoking about 38 years ago. She has never used smokeless tobacco. She reports that she does not drink alcohol or use drugs.  Additional Social History:  Alcohol / Drug Use Pain Medications:  SEE MAR Prescriptions:  SEE MAR Over the Counter:  SEE MAR History of alcohol / drug use?: No history of alcohol / drug abuse  CIWA: CIWA-Ar BP: (!) 172/67 Pulse Rate: 65 COWS:    Allergies:  Allergies  Allergen Reactions  . Codeine Hives and Nausea And Vomiting    Home Medications:  (Not in a hospital admission)  OB/GYN Status:  No LMP recorded. Patient has had a hysterectomy.  General Assessment Data Location of Assessment: [facility] ED TTS Assessment: In system Is this a Tele or  Face-to-Face Assessment?: Face-to-Face Is this an Initial Assessment or a Re-assessment for this encounter?: Initial Assessment Marital status: Divorced [place] name: Riki Sheer Is patient pregnant?: No Pregnancy Status: No Living Arrangements: Non-relatives/Friends Can pt return to current living arrangement?: Yes Admission Status: Voluntary Is patient capable of signing voluntary admission?: Yes Referral Source: Self/Family/Friend Insurance type: Electrical engineer Exam ([place]) Medical Exam completed: Yes  Crisis Care Plan Living Arrangements: Non-relatives/Friends Legal Guardian: Other:(Self)  Education Status Is patient currently in school?: No Is the patient employed, unemployed or receiving disability?: Receiving disability income  Risk to self with the past 6 months Suicidal Ideation: No Has patient been a risk to self within the past 6 months prior to admission? : No Suicidal Intent: No Has patient had any suicidal intent within the past 6 months prior to admission? : No Is patient at risk for suicide?: No Suicidal Plan?: No Has patient had any suicidal plan within the past 6 months prior to admission? : No Access to Means: No What has been your use of drugs/alcohol within the last 12 months?: Pt denies use Previous Attempts/Gestures: No How many times?: 0 Other Self Harm Risks: n/a Triggers for Past Attempts: None known Intentional Self Injurious Behavior: None Family Suicide History: No Recent stressful life event(s): Loss (Comment) Persecutory voices/beliefs?: Yes Depression: No Substance abuse history and/or treatment for substance abuse?: No Suicide prevention information given to non-admitted patients: Not applicable  Risk to Others within the past 6 months Homicidal Ideation: No Does patient have any lifetime risk of violence toward others beyond the six months prior to admission? : No Thoughts of Harm to Others: No Current Homicidal  Intent: No Current Homicidal Plan: No Access to Homicidal Means: No Identified Victim: n/a History of harm to others?: No Assessment of Violence: None Noted Does patient have access to weapons?: No Criminal Charges Pending?: No Does patient have a court date: No Is patient on probation?: No  Psychosis Hallucinations: Auditory, Visual Delusions: Persecutory  Mental Status Report Appearance/Hygiene: Unremarkable Eye Contact: Good Motor Activity: Freedom of movement Speech: Logical/coherent Level of Consciousness: Alert Mood: Helpless Affect: Appropriate to circumstance Anxiety Level: Minimal Thought Processes: Flight of Ideas Judgement: Impaired Orientation: Person, Place, Time, Situation, Appropriate for developmental age Obsessive Compulsive Thoughts/Behaviors: Moderate  Cognitive Functioning Concentration: Decreased Memory: Unable to Assess Is patient IDD: No Is patient DD?: No Insight: Fair Impulse Control: Fair Appetite: Good Have you had any weight changes? : No Change Sleep: No Change Total Hours of Sleep: 7 Vegetative Symptoms: None  ADLScreening [facility] Assessment  Services) Patient's cognitive ability adequate to safely complete daily activities?: Yes Patient able to express need for assistance with ADLs?: Yes Independently performs ADLs?: Yes (appropriate for developmental age)  Prior Inpatient Therapy Prior Inpatient Therapy: No  Prior Outpatient Therapy Prior Outpatient Therapy: No Does patient have an ACCT team?: No Does patient have Intensive In-House Services?  : No Does patient have [facility] services? : No Does patient have P4CC services?: No  ADL Screening (condition at time of admission) Patient's cognitive ability adequate to safely complete daily activities?: Yes Is the patient deaf or have difficulty hearing?: No Does the patient have difficulty seeing, even when wearing glasses/contacts?: No Does the patient have difficulty concentrating,  remembering, or making decisions?: No Patient able to express need for assistance with ADLs?: Yes Does the patient have difficulty dressing or bathing?: No Independently performs ADLs?: Yes (appropriate for developmental age) Does the patient have difficulty walking or climbing stairs?: No Weakness of Legs: Both Weakness of Arms/Hands: Both  Home Assistive Devices/Equipment Home Assistive Devices/Equipment: Eyeglasses  Therapy Consults (therapy consults require a physician order) PT Evaluation Needed: No OT Evalulation Needed: No SLP Evaluation Needed: No Abuse/Neglect Assessment (Assessment to be complete while patient is alone) Abuse/Neglect Assessment Can Be Completed: Yes Physical Abuse: Denies Verbal Abuse: Denies Sexual Abuse: Denies Exploitation of patient/patient's resources: Denies Self-Neglect: Denies Values / Beliefs Cultural Requests During Hospitalization: None Spiritual Requests During Hospitalization: None Consults Spiritual Care Consult Needed: No Social Work Consult Needed: No      Additional Information 1:1 In Past 12 Months?: No CIRT Risk: No Elopement Risk: No Does patient have medical clearance?: Yes  Child/Adolescent Assessment Running Away Risk: (PT IS AN ADULT)  Disposition:  Disposition Initial Assessment Completed for this Encounter: Yes Disposition of Patient: (Pending Psych eval ) Patient refused recommended treatment: No Mode of transportation if patient is discharged?: Car  On Site Evaluation by:   Reviewed with Physician:    Denee Boeder D Luvern Mischke 10/10/2017 12:18 PM

## 2017-10-10 NOTE — BHH Suicide Risk Assessment (Signed)
[facility] Admission Suicide Risk Assessment   Nursing information obtained from:    Demographic factors:    Current Mental Status:    Loss Factors:    Historical Factors:    Risk Reduction Factors:     Total Time spent with patient: 1 hour Principal Problem: Severe major depression, single episode, with psychotic features [facility]) Diagnosis:   Patient Active Problem List   Diagnosis Date Noted  . Severe major depression, single episode, with psychotic features ([place]) [F32.3] 10/10/2017    Priority: High  . Acute midline low back pain without sciatica [M54.5] 09/09/2017  . Sacral bruising, initial encounter [S30.0XXA] 09/09/2017  . Neoplasm of uncertain behavior of skin [D48.5] 09/09/2017  . Impaired fasting glucose [R73.01] 09/09/2017  . Vitamin D deficiency [E55.9] 09/09/2017  . Osteopenia [M85.80] 11/15/2014  . Screening for breast cancer [Z12.31] 09/24/2014  . Elevated serum creatinine [R79.89] 09/24/2014  . Pain in joint, lower leg [M25.569] 09/20/2013  . Routine general medical examination at a health care facility [Z00.00] 09/20/2013  . Hyperglycemia [R73.9] 03/15/2013  . Essential hypertension, benign [I10] 03/15/2013  . Pure hypercholesterolemia [E78.00] 03/15/2013  . OA (osteoarthritis) [M19.90] 03/15/2013  . Glaucoma [H40.9] 03/15/2013   Subjective Data: psychotic break.  Continued Clinical Symptoms:  Alcohol Use Disorder Identification Test Final Score (AUDIT): 0 The "Alcohol Use Disorders Identification Test", Guidelines for Use in Primary Care, Second Edition.  World Pharmacologist [facility]). Score between 0-7:  no or low risk or alcohol related problems. Score between 8-15:  moderate risk of alcohol related problems. Score between 16-19:  high risk of alcohol related problems. Score 20 or above:  warrants further diagnostic evaluation for alcohol dependence and treatment.   CLINICAL FACTORS:   Severe Anxiety and/or Agitation Depression:    Delusional Insomnia Currently Psychotic   Musculoskeletal: Strength & Muscle Tone: within normal limits Gait & Station: normal Patient leans: N/A  Psychiatric Specialty Exam: Physical Exam  Nursing note and vitals reviewed. Psychiatric: Her speech is normal. Judgment normal. Her mood appears anxious. She is actively hallucinating. Thought content is paranoid and delusional. Cognition and memory are normal. She exhibits a depressed mood.    Review of Systems  Neurological: Positive for headaches.  Psychiatric/Behavioral: Positive for depression and hallucinations. The patient is nervous/anxious and has insomnia.   All other systems reviewed and are negative.   Blood pressure (!) 138/110, pulse 72, temperature 97.8 F (36.6 C), temperature source Oral, resp. rate 18, height 5' 2.6" (1.59 m), weight 76.2 kg (168 lb), SpO2 100 %.Body mass index is 30.14 kg/m.  General Appearance: Casual  Eye Contact:  Good  Speech:  Clear and Coherent  Volume:  Normal  Mood:  Anxious and Depressed  Affect:  Flat  Thought Process:  Goal Directed and Descriptions of Associations: Intact  Orientation:  Full (Time, Place, and Person)  Thought Content:  Illogical, Delusions, Hallucinations: Auditory and Paranoid Ideation  Suicidal Thoughts:  No  Homicidal Thoughts:  No  Memory:  Immediate;   Fair Recent;   Fair Remote;   Fair  Judgement:  Poor  Insight:  Lacking  Psychomotor Activity:  Normal  Concentration:  Concentration: Fair and Attention Span: Fair  Recall:  [employer] of Knowledge:  Fair  Language:  Fair  Akathisia:  No  Handed:  Right  AIMS (if indicated):     Assets:  Communication Skills Desire for Improvement Financial Resources/Insurance Housing Physical Health Resilience Social Support Transportation  ADL's:  Intact  Cognition:  WNL  Sleep:  Number of Hours: 6.15      COGNITIVE FEATURES THAT CONTRIBUTE TO RISK:  None    SUICIDE RISK:   Minimal: No identifiable  suicidal ideation.  Patients presenting with no risk factors but with morbid ruminations; may be classified as minimal risk based on the severity of the depressive symptoms  PLAN OF CARE: hospital admission, medication management, discharge planning.  Ms. Pixler is a 72 year old female with no past psychiatric history admitted for a psychotic break.  #Mood/psychosis -start Abilify 2 mg daily -Trazodone 100 mg nightly  #HTN -continue HCTZ 12.5 mg, Lisinopril 20 mg and Propranolol 20 mg BID  #Dyslipidemia -Lipitor 10 mg  #Rule out UTI -urine culture pending  #Labs -lipid panel, TSH and A1C -EKG -head CT scan negative  #Glaucoma -continue eye drops  #Disposition -discharge to home -follow uo with her PCP   I certify that inpatient services furnished can reasonably be expected to improve the patient's condition.   Orson Slick, MD 10/11/2017, 9:32 AM

## 2017-10-10 NOTE — Tx Team (Signed)
Received Stacey Boyd from the ED, alert and oriented x4. She is here for the staff to help her sought out what is real and not real. She has been hearing noises for the last year and feeling people are  trying to kill her. She is afraid she is loosing her mind. She was cooperative with the admission process. She had a fall backwards into the bathtub 2 months ago and was placed on moderate falls precautions. She denied feeling anxious, depressed and suicidal at the present time, she endorses feeling sad related to her best friend dying today. She was oriented to her new environment and visited with her roommate.
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
## 2017-10-10 NOTE — ED Notes (Signed)
VOL, pending placement per Dr Weber Cooks

## 2017-10-10 NOTE — BH Assessment (Signed)
Patient is to be admitted to [facility] by Dr. Weber Cooks.  Attending Physician will be Dr. Bary Leriche.   Patient has been assigned to room 302, by [place].   Intake Paper Work has been signed and placed on patient chart.  ER staff is aware of the admission:  [place] [facility]   Dr. Jacqualine Code, ER MD   [place] Patient's Nurse   [facility] Patient Access.

## 2017-10-10 NOTE — ED Notes (Signed)
Pt dressed out by this RN and Chiropodist.  Yellow colored, watch, ring and earrings placed in pt belonging bag along with clothing (shoes, socks, pants, shirt, bra, underwear.)  Purse also placed in bag.

## 2017-10-11 DIAGNOSIS — F323 Major depressive disorder, single episode, severe with psychotic features: Secondary | ICD-10-CM

## 2017-10-11 LAB — LIPID PANEL
Cholesterol: 188 mg/dL (ref 0–200)
HDL: 43 mg/dL (ref >40)
LDL CALC: 124 mg/dL — AB (ref 0–99)
TRIGLYCERIDES: 107 mg/dL (ref <150)
Total CHOL/HDL Ratio: 4.4 RATIO
VLDL: 21 mg/dL (ref 0–40)

## 2017-10-11 LAB — TSH: TSH: 6.094 u[IU]/mL — ABNORMAL HIGH (ref 0.350–4.500)

## 2017-10-11 LAB — URINE CULTURE
Culture: <10000 — AB
SPECIAL REQUESTS: NORMAL

## 2017-10-11 LAB — HEMOGLOBIN A1C
Hgb A1c MFr Bld: 6.3 % — ABNORMAL HIGH (ref 4.8–5.6)
MEAN PLASMA GLUCOSE: 134.11 mg/dL

## 2017-10-11 MED ORDER — PROPRANOLOL HCL 20 MG PO TABS
20.0000 mg | ORAL_TABLET | Freq: Two times a day (BID) | ORAL | Status: DC
  Administered 2017-10-11 – 2017-10-14 (×8): 20 mg via ORAL
  Filled 2017-10-11 (×8): qty 1

## 2017-10-11 MED ORDER — TRAZODONE HCL 100 MG PO TABS: 100.0000 mg | ORAL_TABLET | Freq: Once | ORAL | Status: DC

## 2017-10-11 NOTE — BHH Suicide Risk Assessment (Signed)
[place] INPATIENT:  Family/Significant Other Suicide Prevention Education  Suicide Prevention Education:  Education Completed; Baruch Goldmann, Denman George, [phone] has been identified by the patient as the family member/significant other with whom the patient will be residing, and identified as the person(s) who will aid the patient in the event of a mental health crisis (suicidal ideations/suicide attempt).  With written consent from the patient, the family member/significant other has been provided the following suicide prevention education, prior to the and/or following the discharge of the patient.  The suicide prevention education provided includes the following:  Suicide risk factors  Suicide prevention and interventions  National Suicide Hotline telephone number  [facility] assessment telephone number  [facility] Emergency Assistance [place] and/or Residential Mobile Crisis Unit telephone number  Request made of family/significant other to:  Remove weapons (e.g., guns, rifles, knives), all items previously/currently identified as safety concern.    Remove drugs/medications (over-the-counter, prescriptions, illicit drugs), all items previously/currently identified as a safety concern.  The family member/significant other verbalizes understanding of the suicide prevention education information provided.  The family member/significant other agrees to remove the items of safety concern listed above.   CSW spoke with the patients friend Nicole Kindred. Nicole Kindred mentions that the patient was hearing voices and commotion that he was not hearing. He reports that they recently moved to their new home due to the commotion. He says that she continues to hear things in the home. He says that the voices are in her mind and are not happening. He thinks that the medications will help. The family member/significant other verbalizes understanding of the suicide prevention education  information provided.  The family member/significant other agrees to remove the items of safety concern listed above if applicable.     Darin Engels 10/11/2017, 11:27 AM

## 2017-10-11 NOTE — Plan of Care (Signed)
Patient up ad lib with a steady gait, alert and oriented to self, place and situation. Denies SI/HI/AVH at this time. Reports appetite is good,energy level is normal and concentration is good. Rates her depression and anxiety a 4. States that her goal for today is to clear her mind of what's real and what's not. Patient also states, "I'm just trying to understand why this is happening." Milieu remains safe with q 15 minute safety checks.

## 2017-10-11 NOTE — Plan of Care (Signed)
Pt calm and cooperative. Pt denies SI/HI. Pt states feels it is good for her to be here. She feels really sad about her best friend dying yesterday. She had first dose of Abilify last night with no complaints. Pt did not receive her Bp medication last night because she felt she didn't need it due to her BP being 122/57, but she also wants to talk to the doctor about changing the dose. (See Mar) Pt is receptive to treatment and safety maintained on unit. Will continue to monitor.  Problem: Education: Goal: Knowledge of [facility] Education information/materials will improve Outcome: Progressing Goal: Emotional status will improve Outcome: Progressing Goal: Mental status will improve Outcome: Progressing Goal: Verbalization of understanding the information provided will improve Outcome: Progressing   Problem: Health Behavior/Discharge Planning: Goal: Identification of resources available to assist in meeting health care needs will improve Outcome: Progressing

## 2017-10-11 NOTE — BHH Group Notes (Signed)
10/11/2017 1PM  Type of Therapy/Topic:  Group Therapy:  Feelings about Diagnosis  Participation Level:  Active   Description of Group:   This group will allow patients to explore their thoughts and feelings about diagnoses they have received. Patients will be guided to explore their level of understanding and acceptance of these diagnoses. Facilitator will encourage patients to process their thoughts and feelings about the reactions of others to their diagnosis and will guide patients in identifying ways to discuss their diagnosis with significant others in their lives. This group will be process-oriented, with patients participating in exploration of their own experiences, giving and receiving support, and processing challenge from other group members.   Therapeutic Goals: 1. Patient will demonstrate understanding of diagnosis as evidenced by identifying two or more symptoms of the disorder 2. Patient will be able to express two feelings regarding the diagnosis 3. Patient will demonstrate their ability to communicate their needs through discussion and/or role play  Summary of Patient Progress: Actively and appropriately engaged in the group. Patient was able to provide support and validation to other group members.Patient practiced active listening when interacting with the facilitator and other group members. Patient reports having a symptom of fear. She says "sometimes I got to have a talk with myself to tell myself that I am going to get better and that these voices is nothing but the Devil. I don't deserve to live in fear. I have to keep doing what I need to do to get rid of the voices."  Patient in still in the process of obtaining treatment goals.        Therapeutic Modalities:   Cognitive Behavioral Therapy Brief Therapy Feelings Identification    Darin Engels, LCSW 10/11/2017 2:18 PM

## 2017-10-11 NOTE — Plan of Care (Addendum)
Patient found visiting with family upon my arrival. Patient is visible and somewhat social this evening. Patient affect is bright and animated. Denies SI/HI. Reports mood is "good, fine." Continues to report hearing singing. Denies VH. Patient is cheerful and talkative with visitor and with daughter on the telephone. Appetite and voiding adequate. Compliant with HS medications and staff direction. Given Trazodone for sleep. Will monitor for efficacy. Q 15 minute checks maintained. Will continue to monitor throughout the shift. @2350 , patient awake complaining of HA 8/10. States, "I had high blood pressure last night and it feels like that." BP 159/86. Given Tylenol. States, "That sleeping medicine you gave me didn't work either. I'm not sleepy." Dr. Weber Cooks contacted. Reported HA, BP, and insomnia. Trazodone 100mg  repeat dose x 1 now ordered and administered. Will monitor for efficacy. While in patient's room, she asked writer to shut the door and "see if you hear all that noise coming from upstairs." Nothing heard. Patient reports hearing "a racket" coming from left corner of room. Reassured patient no noise is audible. States, "Okay, well maybe that pill will make me sleepy so I don't hear that anymore." Will continue to monitor.  Patient sleeping @0115 . Patient slept 5.25. No apparent distress. Denies pain this am. VSS. Will endorse care to oncoming shift.  Problem: Education: Goal: Knowledge of [facility] Education information/materials will improve Outcome: Progressing Goal: Emotional status will improve Outcome: Progressing Goal: Mental status will improve Outcome: Progressing Goal: Verbalization of understanding the information provided will improve Outcome: Progressing
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
## 2017-10-11 NOTE — H&P (Signed)
Psychiatric Admission Assessment Adult  Patient Identification: Stacey Boyd MRN:  [id number] Date of Evaluation:  10/11/2017 Chief Complaint:  Severe major depression, single episode, with psychotic features  Principal Diagnosis: Severe major depression, single episode, with psychotic features [facility]) Diagnosis:   Patient Active Problem List   Diagnosis Date Noted  . Severe major depression, single episode, with psychotic features ([place]) [F32.3] 10/10/2017    Priority: High  . Acute midline low back pain without sciatica [M54.5] 09/09/2017  . Sacral bruising, initial encounter [S30.0XXA] 09/09/2017  . Neoplasm of uncertain behavior of skin [D48.5] 09/09/2017  . Impaired fasting glucose [R73.01] 09/09/2017  . Vitamin D deficiency [E55.9] 09/09/2017  . Osteopenia [M85.80] 11/15/2014  . Screening for breast cancer [Z12.31] 09/24/2014  . Elevated serum creatinine [R79.89] 09/24/2014  . Pain in joint, lower leg [M25.569] 09/20/2013  . Routine general medical examination at a health care facility [Z00.00] 09/20/2013  . Hyperglycemia [R73.9] 03/15/2013  . Essential hypertension, benign [I10] 03/15/2013  . Pure hypercholesterolemia [E78.00] 03/15/2013  . OA (osteoarthritis) [M19.90] 03/15/2013  . Glaucoma [H40.9] 03/15/2013   History of Present Illness:   Identifying data. Stacey Boyd is a 72 year old female with no past psychiatric history.   Chief complaint. "I want to get better."  History of present illness. Information was obtained from the patient and the chart. Mr. Hlad came to the ER afraid for her life. For the past year, she has been hearing noises and screams in the next door apartments. They persist in spite of changing her address three times. They frighten her and keep her up at night. On the day of admission however, she believed that two familiar female voices came to her door, tried to brake in to kill her to please her upstairs neighbor. She spend most of the night holding  her finger on the lock to prevent intruders from coming in and apparently had a long conversation with the voices behind the door. She believes that all three neighbors from thre apartments she lives in belonged to the same religious group. They play music at night and engage in sex. She believes that everything started with allergy to cigarettes. Every time she changes her apartments, she has 2 weeks of quiet, then the noises start again. She stayed at her son's place for a month without suffering.   Past psychiatric history. She was briefly treated with Zoloft after hysterectomy.   Family psychiatric history. Her niece had a nervous breakdown in the face of severe events.  Social history. She is retired. She is very active with family, 8 great grand children, and volunteering. She visits with elderly at the nursing facility. She walks twice a day.   Total Time spent with patient: 1 hour  Is the patient at risk to self? No.  Has the patient been a risk to self in the past 6 months? No.  Has the patient been a risk to self within the distant past? No.  Is the patient a risk to others? No.  Has the patient been a risk to others in the past 6 months? No.  Has the patient been a risk to others within the distant past? No.   Prior Inpatient Therapy:   Prior Outpatient Therapy:    Alcohol Screening: 1. How often do you have a drink containing alcohol?: Never 2. How many drinks containing alcohol do you have on a typical day when you are drinking?: 1 or 2 3. How often do you have six or more drinks  on one occasion?: Never AUDIT-C Score: 0 4. How often during the last year have you found that you were not able to stop drinking once you had started?: Never 5. How often during the last year have you failed to do what was normally expected from you becasue of drinking?: Never 6. How often during the last year have you needed a first drink in the morning to get yourself going after a heavy drinking  session?: Never 7. How often during the last year have you had a feeling of guilt of remorse after drinking?: Never 8. How often during the last year have you been unable to remember what happened the night before because you had been drinking?: Never 9. Have you or someone else been injured as a result of your drinking?: No 10. Has a relative or friend or a doctor or another health worker been concerned about your drinking or suggested you cut down?: No Alcohol Use Disorder Identification Test Final Score (AUDIT): 0 Intervention/Follow-up: Alcohol Education, AUDIT Score <7 follow-up not indicated Substance Abuse History in the last 12 months:  No. Consequences of Substance Abuse: NA Previous Psychotropic Medications: Yes  Psychological Evaluations: No  Past Medical History:  Past Medical History:  Diagnosis Date  . Allergy   . Arthritis   . Diabetes mellitus without complication ([place])    Pt states she is diet controlled.   . Glaucoma   . Hyperlipidemia   . Hypertension   . Wrist fracture 2001   right    Past Surgical History:  Procedure Laterality Date  . ABDOMINAL HYSTERECTOMY  2005  . APPENDECTOMY  1980  . CARDIAC CATHETERIZATION Left 09/17/2015   Procedure: Left Heart Cath and Coronary Angiography;  Surgeon: Teodoro Spray, MD;  Location: [facility];  Service: Cardiovascular;  Laterality: Left;   Family History:  Family History  Problem Relation Age of Onset  . Heart disease Mother        h/o rheumatic fever  . Heart disease Father   . Stroke Father   . Diabetes Sister   . Diabetes Brother   . Diabetes Sister   . Colon cancer Neg Hx   . Breast cancer Neg Hx    Tobacco Screening: Have you used any form of tobacco in the last 30 days? (Cigarettes, Smokeless Tobacco, Cigars, and/or Pipes): No Social History:  Social History   Substance and Sexual Activity  Alcohol Use No     Social History   Substance and Sexual Activity  Drug Use No    Additional  Social History:                           Allergies:   Allergies  Allergen Reactions  . Codeine Hives and Nausea And Vomiting   Lab Results:  Results for orders placed or performed during the hospital encounter of 10/10/17 (from the past 48 hour(s))  Hemoglobin A1c     Status: Abnormal   Collection Time: 10/10/17  8:04 AM  Result Value Ref Range   Hgb A1c MFr Bld 6.3 (H) 4.8 - 5.6 %    Comment: (NOTE) Pre diabetes:          5.7%-6.4% Diabetes:              >6.4% Glycemic control for   <7.0% adults with diabetes    Mean Plasma Glucose 134.11 mg/dL    Comment: Performed at [facility], [place]  [address], [place], [place] [address]  Lipid panel     Status: Abnormal   Collection Time: 10/10/17  8:04 AM  Result Value Ref Range   Cholesterol 188 0 - 200 mg/dL   Triglycerides 107 <150 mg/dL   HDL 43 >40 mg/dL   Total CHOL/HDL Ratio 4.4 RATIO   VLDL 21 0 - 40 mg/dL   LDL Cholesterol 124 (H) 0 - 99 mg/dL    Comment:        Total Cholesterol/HDL:CHD Risk Coronary Heart Disease Risk Table                     Men   Women  1/2 Average Risk   3.4   3.3  Average Risk       5.0   4.4  2 X Average Risk   9.6   7.1  3 X Average Risk  23.4   11.0        Use the calculated Patient Ratio above and the CHD Risk Table to determine the patient's CHD Risk.        ATP III CLASSIFICATION (LDL):  <100     mg/dL   Optimal  100-129  mg/dL   Near or Above                    Optimal  130-159  mg/dL   Borderline  160-189  mg/dL   High  >190     mg/dL   Very High Performed at [facility], [address] [place], [place] [address]   TSH     Status: Abnormal   Collection Time: 10/10/17  8:04 AM  Result Value Ref Range   TSH 6.094 (H) 0.350 - 4.500 uIU/mL    Comment: Performed by a 3rd Generation assay with a functional sensitivity of <=0.01 uIU/mL. Performed at [facility], [address] [place], [place] [address]     Blood Alcohol level:  Lab  Results  Component Value Date   [facility] <10 99/83/3825    Metabolic Disorder Labs:  Lab Results  Component Value Date   HGBA1C 6.3 (H) 10/10/2017   MPG 134.11 10/10/2017   No results found for: PROLACTIN Lab Results  Component Value Date   CHOL 188 10/10/2017   TRIG 107 10/10/2017   HDL 43 10/10/2017   CHOLHDL 4.4 10/10/2017   VLDL 21 10/10/2017   LDLCALC 124 (H) 10/10/2017   LDLCALC 105 (H) 08/22/2017    Current Medications: Current Facility-Administered Medications  Medication Dose Route Frequency Provider Last Rate Last Dose  . acetaminophen (TYLENOL) tablet 650 mg  650 mg Oral Q6H PRN Clapacs, Madie Reno, MD   650 mg at 10/11/17 0545  . alum & mag hydroxide-simeth (MAALOX/MYLANTA) 200-200-20 MG/5ML suspension 30 mL  30 mL Oral Q4H PRN Clapacs, John T, MD      . ARIPiprazole (ABILIFY) tablet 2 mg  2 mg Oral BID Clapacs, Madie Reno, MD   2 mg at 10/11/17 0539  . atorvastatin (LIPITOR) tablet 10 mg  10 mg Oral q1800 Clapacs, John T, MD   10 mg at 10/10/17 1853  . hydrochlorothiazide (MICROZIDE) capsule 12.5 mg  12.5 mg Oral Daily Clapacs, John T, MD   12.5 mg at 10/11/17 0543  . latanoprost (XALATAN) 0.005 % ophthalmic solution 1 drop  1 drop Both Eyes QHS Clapacs, Madie Reno, MD   1 drop at 10/10/17 2138  . lisinopril (PRINIVIL,ZESTRIL) tablet 20 mg  20 mg Oral Daily Clapacs,  Madie Reno, MD   20 mg at 10/11/17 0544  . loratadine (CLARITIN) tablet 10 mg  10 mg Oral Daily Clapacs, Madie Reno, MD   10 mg at 10/11/17 0809  . magnesium hydroxide (MILK OF MAGNESIA) suspension 30 mL  30 mL Oral Daily PRN Clapacs, John T, MD      . propranolol (INDERAL) tablet 20 mg  20 mg Oral BID Saylah Ketner B, MD   20 mg at 10/11/17 0808  . timolol (TIMOPTIC) 0.5 % ophthalmic solution 1 drop  1 drop Both Eyes Daily Clapacs, Madie Reno, MD   1 drop at 10/11/17 [phone]  . traZODone (DESYREL) tablet 100 mg  100 mg Oral QHS PRN Clapacs, Madie Reno, MD       PTA Medications: Medications Prior to Admission  Medication Sig  Dispense Refill Last Dose  . atorvastatin (LIPITOR) 10 MG tablet TAKE 1 TABLET (10 MG TOTAL) BY MOUTH DAILY. 90 tablet 0 10/09/2017 at 2000  . azelastine (ASTELIN) 0.1 % nasal spray PLACE 2 SPRAYS INTO BOTH NOSTRILS 2 (TWO) TIMES DAILY AS NEEDED FOR CONGESTION  11 PRN at PRN  . Cholecalciferol (VITAMIN D3) 5000 units TABS Take 5,000 Units by mouth daily.    10/09/2017 at 0800  . fexofenadine (ALLEGRA) 180 MG tablet Take 180 mg by mouth daily.   10/09/2017 at 0800  . ibuprofen (ADVIL,MOTRIN) 200 MG tablet Take 400 mg by mouth every 6 (six) hours as needed for pain. Reported on 09/17/2015   PRN at PRN  . latanoprost (XALATAN) 0.005 % ophthalmic solution Place 1 drop into both eyes at bedtime.   10/09/2017 at 2000  . lisinopril-hydrochlorothiazide (PRINZIDE,ZESTORETIC) 20-12.5 MG tablet Take 1 tablet by mouth daily. MUST SCHEDULE ANNUAL EXAM FOR FURTHER REFILLS 90 tablet 0 10/09/2017 at 0800  . Multiple Vitamins-Minerals (PRESERVISION AREDS 2) CAPS Take 1 capsule by mouth daily.   10/09/2017 at 0800  . propranolol (INDERAL) 40 MG tablet Take 20 mg by mouth 2 (two) times daily.   10/09/2017 at 2000  . rOPINIRole (REQUIP) 2 MG tablet TAKE 1 TABLET (2 MG TOTAL) BY MOUTH NIGHTLY.  11 10/09/2017 at 2000  . timolol (TIMOPTIC) 0.5 % ophthalmic solution INSTILL ONE DROP INTO BOTH EYES DAILY  3 10/09/2017 at 0800    Musculoskeletal: Strength & Muscle Tone: within normal limits Gait & Station: normal Patient leans: N/A  Psychiatric Specialty Exam: I reviewed physical examination performed in the ER and agree with the findings. Physical Exam  Nursing note and vitals reviewed. Psychiatric: Her speech is normal. Judgment normal. Her mood appears anxious. She is actively hallucinating. Thought content is paranoid and delusional. Cognition and memory are normal. She exhibits a depressed mood.    Review of Systems  Neurological: Positive for headaches.  Psychiatric/Behavioral: Positive for depression and hallucinations.  The patient is nervous/anxious and has insomnia.   All other systems reviewed and are negative.   Blood pressure (!) 138/110, pulse 72, temperature 97.8 F (36.6 C), temperature source Oral, resp. rate 18, height 5' 2.6" (1.59 m), weight 76.2 kg (168 lb), SpO2 100 %.Body mass index is 30.14 kg/m.  See SRA                                                  Sleep:  Number of Hours: 6.15    Treatment Plan Summary: Daily contact with  patient to assess and evaluate symptoms and progress in treatment and Medication management   Stacey Boyd is a 72 year old female with no past psychiatric history admitted for a psychotic break.  #Mood/psychosis -start Abilify 2 mg daily -Trazodone 100 mg nightly  #HTN -continue HCTZ 12.5 mg, Lisinopril 20 mg and Propranolol 20 mg BID  #Dyslipidemia -Lipitor 10 mg  #Rule out UTI -urine culture pending  #Labs -lipid panel, TSH and A1C -EKG -head CT scan negative  #Glaucoma -continue eye drops  #Disposition -discharge to home -follow uo with her PCP   Observation Level/Precautions:  15 minute checks  Laboratory:  CBC Chemistry Profile UDS UA  Psychotherapy:    Medications:    Consultations:    Discharge Concerns:    Estimated LOS:  Other:     Physician Treatment Plan for Primary Diagnosis: Severe major depression, single episode, with psychotic features ([place]) Long Term Goal(s): Improvement in symptoms so as ready for discharge  Short Term Goals: Ability to identify changes in lifestyle to reduce recurrence of condition will improve, Ability to verbalize feelings will improve, Ability to disclose and discuss suicidal ideas, Ability to demonstrate self-control will improve, Ability to identify and develop effective coping behaviors will improve, Ability to maintain clinical measurements within normal limits will improve, Compliance with prescribed medications will improve and Ability to identify triggers associated with  substance abuse/mental health issues will improve  Physician Treatment Plan for Secondary Diagnosis: Principal Problem:   Severe major depression, single episode, with psychotic features ([place]) Active Problems:   Essential hypertension, benign   Pure hypercholesterolemia   Glaucoma  Long Term Goal(s): Improvement in symptoms so as ready for discharge  Short Term Goals: Ability to identify changes in lifestyle to reduce recurrence of condition will improve, Ability to demonstrate self-control will improve and Ability to identify triggers associated with substance abuse/mental health issues will improve  I certify that inpatient services furnished can reasonably be expected to improve the patient's condition.    Orson Slick, MD 5/28/20199:50 AM

## 2017-10-11 NOTE — Progress Notes (Addendum)
[facility] MD Progress Note  10/11/2017 5:57 PM Stacey Boyd  MRN:  [id number]  Subjective:    Stacey Boyd could not sleep much last night and was hearing music in her room at night while reading the Bible. She is asking to have Abilify at night becouse it "relaxes her". No other complaints. She met with treatment team today. She is completely sensible outside of her delusions. She has some distance to her symptoms and wants to figure out if the voices are real.   Principal Problem: Severe major depression, single episode, with psychotic features [facility]) Diagnosis:   Patient Active Problem List   Diagnosis Date Noted  . Severe major depression, single episode, with psychotic features ([place]) [F32.3] 10/10/2017    Priority: High  . Acute midline low back pain without sciatica [M54.5] 09/09/2017  . Sacral bruising, initial encounter [S30.0XXA] 09/09/2017  . Neoplasm of uncertain behavior of skin [D48.5] 09/09/2017  . Impaired fasting glucose [R73.01] 09/09/2017  . Vitamin D deficiency [E55.9] 09/09/2017  . Osteopenia [M85.80] 11/15/2014  . Screening for breast cancer [Z12.31] 09/24/2014  . Elevated serum creatinine [R79.89] 09/24/2014  . Pain in joint, lower leg [M25.569] 09/20/2013  . Routine general medical examination at a health care facility [Z00.00] 09/20/2013  . Hyperglycemia [R73.9] 03/15/2013  . Essential hypertension, benign [I10] 03/15/2013  . Pure hypercholesterolemia [E78.00] 03/15/2013  . OA (osteoarthritis) [M19.90] 03/15/2013  . Glaucoma [H40.9] 03/15/2013   Total Time spent with patient: 20 minutes  Past Psychiatric History: none  Past Medical History:  Past Medical History:  Diagnosis Date  . Allergy   . Arthritis   . Diabetes mellitus without complication ([place])    Pt states she is diet controlled.   . Glaucoma   . Hyperlipidemia   . Hypertension   . Wrist fracture 2001   right    Past Surgical History:  Procedure Laterality Date  . ABDOMINAL HYSTERECTOMY  2005   . APPENDECTOMY  1980  . CARDIAC CATHETERIZATION Left 09/17/2015   Procedure: Left Heart Cath and Coronary Angiography;  Surgeon: Teodoro Spray, MD;  Location: [facility];  Service: Cardiovascular;  Laterality: Left;   Family History:  Family History  Problem Relation Age of Onset  . Heart disease Mother        h/o rheumatic fever  . Heart disease Father   . Stroke Father   . Diabetes Sister   . Diabetes Brother   . Diabetes Sister   . Colon cancer Neg Hx   . Breast cancer Neg Hx    Family Psychiatric  History: none Social History:  Social History   Substance and Sexual Activity  Alcohol Use No     Social History   Substance and Sexual Activity  Drug Use No    Social History   Socioeconomic History  . Marital status: Divorced    Spouse name: Not on file  . Number of children: Not on file  . Years of education: Not on file  . Highest education level: Not on file  Occupational History  . Not on file  Social Needs  . Financial resource strain: Not on file  . Food insecurity:    Worry: Not on file    Inability: Not on file  . Transportation needs:    Medical: Not on file    Non-medical: Not on file  Tobacco Use  . Smoking status: Former Smoker    Last attempt to quit: 09/17/1979    Years since quitting: 38.0  .  Smokeless tobacco: Never Used  Substance and Sexual Activity  . Alcohol use: No  . Drug use: No  . Sexual activity: Not on file  Lifestyle  . Physical activity:    Days per week: Not on file    Minutes per session: Not on file  . Stress: Not on file  Relationships  . Social connections:    Talks on phone: Not on file    Gets together: Not on file    Attends religious service: Not on file    Active member of club or organization: Not on file    Attends meetings of clubs or organizations: Not on file    Relationship status: Not on file  Other Topics Concern  . Not on file  Social History Narrative   3 kids, local   Lives with daughter  as of 2016   Divorced ~2002   Additional Social History:                         Sleep: Fair  Appetite:  Fair  Current Medications: Current Facility-Administered Medications  Medication Dose Route Frequency Provider Last Rate Last Dose  . acetaminophen (TYLENOL) tablet 650 mg  650 mg Oral Q6H PRN Clapacs, Madie Reno, MD   650 mg at 10/11/17 0545  . alum & mag hydroxide-simeth (MAALOX/MYLANTA) 200-200-20 MG/5ML suspension 30 mL  30 mL Oral Q4H PRN Clapacs, John T, MD      . ARIPiprazole (ABILIFY) tablet 2 mg  2 mg Oral BID Clapacs, Madie Reno, MD   2 mg at 10/11/17 1701  . atorvastatin (LIPITOR) tablet 10 mg  10 mg Oral q1800 Clapacs, Madie Reno, MD   10 mg at 10/11/17 1701  . hydrochlorothiazide (MICROZIDE) capsule 12.5 mg  12.5 mg Oral Daily Clapacs, John T, MD   12.5 mg at 10/11/17 0543  . latanoprost (XALATAN) 0.005 % ophthalmic solution 1 drop  1 drop Both Eyes QHS Clapacs, Madie Reno, MD   1 drop at 10/10/17 2138  . lisinopril (PRINIVIL,ZESTRIL) tablet 20 mg  20 mg Oral Daily Clapacs, Madie Reno, MD   20 mg at 10/11/17 0544  . loratadine (CLARITIN) tablet 10 mg  10 mg Oral Daily Clapacs, Madie Reno, MD   10 mg at 10/11/17 0809  . magnesium hydroxide (MILK OF MAGNESIA) suspension 30 mL  30 mL Oral Daily PRN Clapacs, John T, MD      . propranolol (INDERAL) tablet 20 mg  20 mg Oral BID Dev Dhondt B, MD   20 mg at 10/11/17 1701  . timolol (TIMOPTIC) 0.5 % ophthalmic solution 1 drop  1 drop Both Eyes Daily Clapacs, Madie Reno, MD   1 drop at 10/11/17 [phone]  . traZODone (DESYREL) tablet 100 mg  100 mg Oral QHS PRN Clapacs, Madie Reno, MD        Lab Results:  Results for orders placed or performed during the hospital encounter of 10/10/17 (from the past 48 hour(s))  Hemoglobin A1c     Status: Abnormal   Collection Time: 10/10/17  8:04 AM  Result Value Ref Range   Hgb A1c MFr Bld 6.3 (H) 4.8 - 5.6 %    Comment: (NOTE) Pre diabetes:          5.7%-6.4% Diabetes:              >6.4% Glycemic control  for   <7.0% adults with diabetes    Mean Plasma Glucose 134.11 mg/dL    Comment:  Performed at [facility], [place] [address] [place], [place] [address]  Lipid panel     Status: Abnormal   Collection Time: 10/10/17  8:04 AM  Result Value Ref Range   Cholesterol 188 0 - 200 mg/dL   Triglycerides 107 <150 mg/dL   HDL 43 >40 mg/dL   Total CHOL/HDL Ratio 4.4 RATIO   VLDL 21 0 - 40 mg/dL   LDL Cholesterol 124 (H) 0 - 99 mg/dL    Comment:        Total Cholesterol/HDL:CHD Risk Coronary Heart Disease Risk Table                     Men   Women  1/2 Average Risk   3.4   3.3  Average Risk       5.0   4.4  2 X Average Risk   9.6   7.1  3 X Average Risk  23.4   11.0        Use the calculated Patient Ratio above and the CHD Risk Table to determine the patient's CHD Risk.        ATP III CLASSIFICATION (LDL):  <100     mg/dL   Optimal  100-129  mg/dL   Near or Above                    Optimal  130-159  mg/dL   Borderline  160-189  mg/dL   High  >190     mg/dL   Very High Performed at [facility], [address] [place], [place] [address]   TSH     Status: Abnormal   Collection Time: 10/10/17  8:04 AM  Result Value Ref Range   TSH 6.094 (H) 0.350 - 4.500 uIU/mL    Comment: Performed by a 3rd Generation assay with a functional sensitivity of <=0.01 uIU/mL. Performed at [facility], [address] [place], [place] [address]     Blood Alcohol level:  Lab Results  Component Value Date   [facility] <10 12/75/1700    Metabolic Disorder Labs: Lab Results  Component Value Date   HGBA1C 6.3 (H) 10/10/2017   MPG 134.11 10/10/2017   No results found for: PROLACTIN Lab Results  Component Value Date   CHOL 188 10/10/2017   TRIG 107 10/10/2017   HDL 43 10/10/2017   CHOLHDL 4.4 10/10/2017   VLDL 21 10/10/2017   LDLCALC 124 (H) 10/10/2017   LDLCALC 105 (H) 08/22/2017    Physical Findings: AIMS:  , ,  ,  ,    CIWA:    COWS:     Musculoskeletal: Strength &  Muscle Tone: within normal limits Gait & Station: normal Patient leans: N/A  Psychiatric Specialty Exam: Physical Exam  Nursing note and vitals reviewed. Psychiatric: Her speech is normal. Her mood appears anxious. She is actively hallucinating. Thought content is paranoid and delusional. Cognition and memory are normal. She expresses impulsivity.    Review of Systems  Neurological: Negative.   Psychiatric/Behavioral: Positive for hallucinations. The patient has insomnia.   All other systems reviewed and are negative.   Blood pressure 132/69, pulse 60, temperature 97.8 F (36.6 C), temperature source Oral, resp. rate 18, height 5' 2.6" (1.59 m), weight 76.2 kg (168 lb), SpO2 100 %.Body mass index is 30.14 kg/m.  General Appearance: Casual  Eye Contact:  Good  Speech:  Clear and Coherent  Volume:  Normal  Mood:  Anxious  Affect:  Flat  Thought Process:  Goal Directed and Descriptions of Associations: Intact  Orientation:  Full (Time, Place, and Person)  Thought Content:  Illogical, Delusions, Hallucinations: Auditory and Paranoid Ideation  Suicidal Thoughts:  No  Homicidal Thoughts:  No  Memory:  Immediate;   Fair Recent;   Fair Remote;   Fair  Judgement:  Poor  Insight:  Lacking  Psychomotor Activity:  Increased  Concentration:  Concentration: Fair and Attention Span: Fair  Recall:  [employer] of Knowledge:  Fair  Language:  Fair  Akathisia:  No  Handed:  Right  AIMS (if indicated):     Assets:  Communication Skills Desire for Improvement Financial Resources/Insurance [place] Talents/Skills Transportation  ADL's:  Intact  Cognition:  WNL  Sleep:  Number of Hours: 6.15     Treatment Plan Summary: Daily contact with patient to assess and evaluate symptoms and progress in treatment and Medication management   Stacey Boyd is a 72 year old female with no past psychiatric history admitted for a psychotic  break.  #Mood/psychosis -increase Abilify to 5 mg nightly   -Trazodone 100 mg nightly as needed only  #HTN -continue HCTZ 12.5 mg, Lisinopril 20 mg and Propranolol 20 mg BID  #Dyslipidemia -Lipitor 10 mg  #Rule out UTI -urine culture is negative  #Labs -lipid panel, TSH and A1C are unremarkable -EKG, reviewed QTc 473 -head CT scan negative  #Glaucoma -continue eye drops  #Disposition -discharge to home -follow up with her PCP    Orson Slick, MD 10/11/2017, 5:57 PM

## 2017-10-11 NOTE — Progress Notes (Signed)
Recreation Therapy Notes  INPATIENT RECREATION THERAPY ASSESSMENT  Patient Details Name: Evalina Tabak MRN: [id number] DOB: [birth year] Today's Date: 10/11/2017       Information Obtained From: Patient  Able to Participate in Assessment/Interview: Yes  Patient Presentation: Responsive  Reason for Admission (Per Patient): Active Symptoms  Patient Stressors: Other (Comment)(living situation, Education administrator)  Coping Skills:   Prayer, Read  Leisure Interests (2+):  Exercise - Walking, Crafts - Knitting/Crocheting, Crafts - Sewing  Frequency of Recreation/Participation: Weekly  Awareness of Community Resources:  Yes  Community Resources:  [employer]  Current Use: Yes  If no, Barriers?:    Expressed Interest in [place] of Residence:  Insurance underwriter  Patient Main Form of Transportation: Musician  Patient Strengths:  Determined, Strong person  Patient Identified Areas of Improvement:  Stop the voices/noise  Patient Goal for Hospitalization:  To stop the voices/noise  Current SI (including self-harm):  No  Current HI:  No  Current AVH: No  Staff Intervention Plan: Group Attendance, Collaborate with Interdisciplinary Treatment Team  Consent to Intern Participation: N/A  Mallie Linnemann 10/11/2017, 1:58 PM

## 2017-10-11 NOTE — Progress Notes (Signed)
Recreation Therapy Notes   Date: 10/11/2017  Time: 9:30 am  Location: Craft Room  Behavioral response: Appropriate  Intervention Topic: Coping Skills  Discussion/Intervention:  Group content on today was focused on coping skills. The group defined what coping skills are and when they can be used. Individuals described how they normally cope with things and the coping skills they normally use. Patients expressed why it is important to cope with things and how not coping with things can affect you. The group participated in the intervention "My coping box" and made coping boxes while adding coping skills they could use in the future to the box. Clinical Observations/Feedback:  Patient came to group and identified sewing and crocheting as coping skills she uses. Individual participated in the intervention and was social with peers and staff during group. Giancarlos Berendt LRT/CTRS          Latwan Luchsinger 10/11/2017 11:10 AM

## 2017-10-12 ENCOUNTER — Telehealth: Payer: Self-pay | Admitting: Nurse Practitioner

## 2017-10-12 MED ORDER — ARIPIPRAZOLE 5 MG PO TABS
5.0000 mg | ORAL_TABLET | Freq: Every day | ORAL | Status: DC
  Administered 2017-10-12: 5 mg via ORAL
  Filled 2017-10-12: qty 1

## 2017-10-12 MED ORDER — TRAZODONE HCL 100 MG PO TABS
100.0000 mg | ORAL_TABLET | Freq: Once | ORAL | Status: AC
  Administered 2017-10-12: 100 mg via ORAL

## 2017-10-12 NOTE — Progress Notes (Signed)
Recreation Therapy Notes  Date: 10/12/2017  Time: 9:30 am  Location: Craft Room  Behavioral response: Appropriate  Intervention Topic: Problem Solving  Discussion/Intervention:  Group content on today was focused on problem solving. The group described what problem solving is. Patients expressed how problems affects them and how they deal with problems. Individuals identified healthy ways to deal with problems. Patients explained what normally happens to them when they do not deal with problems. The group expressed reoccurring problems for them. The group participated in the intervention "Put the story together" with their peers and worked together to put a story that was broken up in the correct order. Clinical Observations/Feedback:  Patient came to group and defined problem solving as thinking about what you have to do and working on it. She stated that she deals with her problem by thinking about her problems and the solutions. Individual participated in the intervention and was social with peers and staff during group. Tatayana Beshears LRT/CTRS         Genesis Paget 10/12/2017 12:25 PM

## 2017-10-12 NOTE — BHH Counselor (Signed)
CSW sent over information to [facility] for  review to see if the patient can come their for outpatient medication management at discharge.  Darin Engels, MSW, Latanya Presser, [place] Clinical Social Worker 10/12/2017 3:28 PM

## 2017-10-12 NOTE — BHH Group Notes (Signed)
LCSW Group Therapy Note  10/12/2017 1:00 pm  Type of Therapy/Topic:  Group Therapy:  Emotion Regulation  Participation Level:  Active   Description of Group:    The purpose of this group is to assist patients in learning to regulate negative emotions and experience positive emotions. Patients will be guided to discuss ways in which they have been vulnerable to their negative emotions. These vulnerabilities will be juxtaposed with experiences of positive emotions or situations, and patients will be challenged to use positive emotions to combat negative ones. Special emphasis will be placed on coping with negative emotions in conflict situations, and patients will process healthy conflict resolution skills.  Therapeutic Goals: 1. Patient will identify two positive emotions or experiences to reflect on in order to balance out negative emotions 2. Patient will label two or more emotions that they find the most difficult to experience 3. Patient will demonstrate positive conflict resolution skills through discussion and/or role plays  Summary of Patient Progress:  Sadaf actively participated in today's discussion on emotion regulation.  She was able to identify two emotions that she finds to be most difficult to experience which included frustration and anxiousness.  Nianna shared that she often feels frustration because her friends and family often do not understand what she is experiencing.  She also shared that she mostly experiences anxiousness at night which is the time in which he often hears "voices".  Adlyn shared that she experiences positive emotions throughout the day which includes contentment and happiness.  Kelie shared that she often deals with conflict with her friends and family by just expressing how she feels and "taking care of herself" which recently included coming to the hospital to get help.     Therapeutic Modalities:   Cognitive Behavioral Therapy Feelings  Identification Dialectical Behavioral Therapy

## 2017-10-12 NOTE — Plan of Care (Addendum)
Patient found in dayroom with visitor upon my arrival. Patient is visible and social this evening. Denies depression and affect is brighter. States, "I just need to figure out if what I am hearing is really there or not. That's when I will feel all the way better." Denies SI/HI/VH. Continues to hear voices, singing, and yelling through the walls when she is alone. Complains of sinus headache 6/10, given Tylenol. Will monitor for efficacy. Reports eating and voiding adequately. Compliant with HS medications and staff direction. Q 15 minute checks maintained. Will continue to monitor throughout the shift. Patient slept 6.5 hours. No apparent distress. Complains of poor sleep quality "because everyone was making such a racket upstairs." Will endorse care to oncoming shift.  Problem: Education: Goal: Knowledge of [facility] Education information/materials will improve Outcome: Progressing Goal: Emotional status will improve Outcome: Progressing Goal: Mental status will improve Outcome: Progressing Goal: Verbalization of understanding the information provided will improve Outcome: Progressing   Problem: Education: Goal: Knowledge of the prescribed therapeutic regimen will improve Outcome: Progressing   Problem: Coping: Goal: Coping ability will improve Outcome: Progressing Goal: Will verbalize feelings Outcome: Progressing

## 2017-10-12 NOTE — Tx Team (Addendum)
Interdisciplinary Treatment and Diagnostic Plan Update  10/12/2017 Time of Session: 10:30am Stacey Boyd MRN: [id number]  Principal Diagnosis: Severe major depression, single episode, with psychotic features ([place])  Secondary Diagnoses: Principal Problem:   Severe major depression, single episode, with psychotic features ([place]) Active Problems:   Essential hypertension, benign   Pure hypercholesterolemia   Glaucoma   Current Medications:  Current Facility-Administered Medications  Medication Dose Route Frequency Provider Last Rate Last Dose  . acetaminophen (TYLENOL) tablet 650 mg  650 mg Oral Q6H PRN Clapacs, Madie Reno, MD   650 mg at 10/11/17 2350  . alum & mag hydroxide-simeth (MAALOX/MYLANTA) 200-200-20 MG/5ML suspension 30 mL  30 mL Oral Q4H PRN Clapacs, John T, MD      . ARIPiprazole (ABILIFY) tablet 2 mg  2 mg Oral BID Clapacs, Madie Reno, MD   2 mg at 10/12/17 0803  . atorvastatin (LIPITOR) tablet 10 mg  10 mg Oral q1800 Clapacs, Madie Reno, MD   10 mg at 10/11/17 1701  . hydrochlorothiazide (MICROZIDE) capsule 12.5 mg  12.5 mg Oral Daily Clapacs, John T, MD   12.5 mg at 10/12/17 0803  . latanoprost (XALATAN) 0.005 % ophthalmic solution 1 drop  1 drop Both Eyes QHS Clapacs, Madie Reno, MD   1 drop at 10/11/17 2112  . lisinopril (PRINIVIL,ZESTRIL) tablet 20 mg  20 mg Oral Daily Clapacs, Madie Reno, MD   20 mg at 10/12/17 0802  . loratadine (CLARITIN) tablet 10 mg  10 mg Oral Daily Clapacs, Madie Reno, MD   10 mg at 10/12/17 0803  . magnesium hydroxide (MILK OF MAGNESIA) suspension 30 mL  30 mL Oral Daily PRN Clapacs, John T, MD      . propranolol (INDERAL) tablet 20 mg  20 mg Oral BID Pucilowska, Jolanta B, MD   20 mg at 10/12/17 0803  . timolol (TIMOPTIC) 0.5 % ophthalmic solution 1 drop  1 drop Both Eyes Daily Clapacs, Madie Reno, MD   1 drop at 10/12/17 0806  . traZODone (DESYREL) tablet 100 mg  100 mg Oral QHS PRN Clapacs, Madie Reno, MD   100 mg at 10/11/17 2113   PTA Medications: Medications Prior to  Admission  Medication Sig Dispense Refill Last Dose  . atorvastatin (LIPITOR) 10 MG tablet TAKE 1 TABLET (10 MG TOTAL) BY MOUTH DAILY. 90 tablet 0 10/09/2017 at 2000  . azelastine (ASTELIN) 0.1 % nasal spray PLACE 2 SPRAYS INTO BOTH NOSTRILS 2 (TWO) TIMES DAILY AS NEEDED FOR CONGESTION  11 PRN at PRN  . Cholecalciferol (VITAMIN D3) 5000 units TABS Take 5,000 Units by mouth daily.    10/09/2017 at 0800  . fexofenadine (ALLEGRA) 180 MG tablet Take 180 mg by mouth daily.   10/09/2017 at 0800  . ibuprofen (ADVIL,MOTRIN) 200 MG tablet Take 400 mg by mouth every 6 (six) hours as needed for pain. Reported on 09/17/2015   PRN at PRN  . latanoprost (XALATAN) 0.005 % ophthalmic solution Place 1 drop into both eyes at bedtime.   10/09/2017 at 2000  . lisinopril-hydrochlorothiazide (PRINZIDE,ZESTORETIC) 20-12.5 MG tablet Take 1 tablet by mouth daily. MUST SCHEDULE ANNUAL EXAM FOR FURTHER REFILLS 90 tablet 0 10/09/2017 at 0800  . Multiple Vitamins-Minerals (PRESERVISION AREDS 2) CAPS Take 1 capsule by mouth daily.   10/09/2017 at 0800  . propranolol (INDERAL) 40 MG tablet Take 20 mg by mouth 2 (two) times daily.   10/09/2017 at 2000  . rOPINIRole (REQUIP) 2 MG tablet TAKE 1 TABLET (2 MG TOTAL) BY MOUTH  NIGHTLY.  11 10/09/2017 at 2000  . timolol (TIMOPTIC) 0.5 % ophthalmic solution INSTILL ONE DROP INTO BOTH EYES DAILY  3 10/09/2017 at 0800    Patient Stressors: Other: hearing noises from neighbor and believing people are trying to kill her  Patient Strengths: Ability for insight Active sense of humor Average or above average intelligence Capable of independent living Communication skills Motivation for treatment/growth Physical Health  Treatment Modalities: Medication Management, Group therapy, Case management,  1 to 1 session with clinician, Psychoeducation, Recreational therapy.   Physician Treatment Plan for Primary Diagnosis: Severe major depression, single episode, with psychotic features ([place]) Long Term  Goal(s): Improvement in symptoms so as ready for discharge Improvement in symptoms so as ready for discharge   Short Term Goals: Ability to identify changes in lifestyle to reduce recurrence of condition will improve Ability to verbalize feelings will improve Ability to disclose and discuss suicidal ideas Ability to demonstrate self-control will improve Ability to identify and develop effective coping behaviors will improve Ability to maintain clinical measurements within normal limits will improve Compliance with prescribed medications will improve Ability to identify triggers associated with substance abuse/mental health issues will improve Ability to identify changes in lifestyle to reduce recurrence of condition will improve Ability to demonstrate self-control will improve Ability to identify triggers associated with substance abuse/mental health issues will improve  Medication Management: Evaluate patient's response, side effects, and tolerance of medication regimen.  Therapeutic Interventions: 1 to 1 sessions, Unit Group sessions and Medication administration.  Evaluation of Outcomes: Progressing  Physician Treatment Plan for Secondary Diagnosis: Principal Problem:   Severe major depression, single episode, with psychotic features ([place]) Active Problems:   Essential hypertension, benign   Pure hypercholesterolemia   Glaucoma  Long Term Goal(s): Improvement in symptoms so as ready for discharge Improvement in symptoms so as ready for discharge   Short Term Goals: Ability to identify changes in lifestyle to reduce recurrence of condition will improve Ability to verbalize feelings will improve Ability to disclose and discuss suicidal ideas Ability to demonstrate self-control will improve Ability to identify and develop effective coping behaviors will improve Ability to maintain clinical measurements within normal limits will improve Compliance with prescribed medications will  improve Ability to identify triggers associated with substance abuse/mental health issues will improve Ability to identify changes in lifestyle to reduce recurrence of condition will improve Ability to demonstrate self-control will improve Ability to identify triggers associated with substance abuse/mental health issues will improve     Medication Management: Evaluate patient's response, side effects, and tolerance of medication regimen.  Therapeutic Interventions: 1 to 1 sessions, Unit Group sessions and Medication administration.  Evaluation of Outcomes: Progressing   RN Treatment Plan for Primary Diagnosis: Severe major depression, single episode, with psychotic features ([place]) Long Term Goal(s): Knowledge of disease and therapeutic regimen to maintain health will improve  Short Term Goals: Ability to verbalize feelings will improve, Ability to identify and develop effective coping behaviors will improve and Compliance with prescribed medications will improve  Medication Management: RN will administer medications as ordered by provider, will assess and evaluate patient's response and provide education to patient for prescribed medication. RN will report any adverse and/or side effects to prescribing provider.  Therapeutic Interventions: 1 on 1 counseling sessions, Psychoeducation, Medication administration, Evaluate responses to treatment, Monitor vital signs and CBGs as ordered, Perform/monitor CIWA, COWS, AIMS and Fall Risk screenings as ordered, Perform wound care treatments as ordered.  Evaluation of Outcomes: Progressing   LCSW Treatment Plan  for Primary Diagnosis: Severe major depression, single episode, with psychotic features ([place]) Long Term Goal(s): Safe transition to appropriate next level of care at discharge, Engage patient in therapeutic group addressing interpersonal concerns.  Short Term Goals: Engage patient in aftercare planning with referrals and resources, Facilitate  patient progression through stages of change regarding substance use diagnoses and concerns, Identify triggers associated with mental health/substance abuse issues and Increase skills for wellness and recovery  Therapeutic Interventions: Assess for all discharge needs, 1 to 1 time with Social worker, Explore available resources and support systems, Assess for adequacy in community support network, Educate family and significant other(s) on suicide prevention, Complete Psychosocial Assessment, Interpersonal group therapy.  Evaluation of Outcomes: Progressing   Progress in Treatment: Attending groups: Yes. Participating in groups: Yes. Taking medication as prescribed: Yes. Toleration medication: Yes. Family/Significant other contact made: Yes, individual(s) contacted:  Baruch Goldmann, Denman George, [phone]  Patient understands diagnosis: Yes. Discussing patient identified problems/goals with staff: Yes. Medical problems stabilized or resolved: Yes. Denies suicidal/homicidal ideation: Yes. Issues/concerns per patient self-inventory: No. Other:   New problem(s) identified: No, Describe:  None  New Short Term/Long Term Goal(s): "To find out what is real and what is fiction. If its fiction I want it to stop and if it is real I want to know why."  Patient Goals:  "To find out what is real and what is fiction. If its fiction I want it to stop and if it is real I want to know why."  Discharge Plan or Barriers: To return home and follow up with outpatient services.  Reason for Continuation of Hospitalization: Hallucinations Medication stabilization  Estimated Length of Stay: 3-5 days  Recreational Therapy: Patient Stressors: Living situation, Neighbors   Patient Goal: Patient will identify 3 positive coping skills strategies to use post d/c within 5 recreation therapy group sessions  Attendees: Patient: Stacey Boyd 10/12/2017 11:38 AM  Physician: Dr. Bary Leriche, MD 10/12/2017 11:38 AM   Nursing: Polly Cobia, RN 10/12/2017 11:38 AM  RN Care Manager: 10/12/2017 11:38 AM  Social Worker: Darin Engels, [place] 10/12/2017 11:38 AM  Recreational Therapist: Isaias Sakai. Marcello Fennel, LRT 10/12/2017 11:38 AM  Other: Derrek Gu, LCSW 10/12/2017 11:38 AM  Other:  10/12/2017 11:38 AM  Other: 10/12/2017 11:38 AM    Scribe for Treatment Team: Darin Engels, LCSW 10/12/2017 11:38 AM

## 2017-10-12 NOTE — Plan of Care (Signed)
Patient  informed of information regarding education on [facility] . Emotional  and mental status improved Attending  unit programing  allowing for verbalization of feelings  compliant  with  medication . Problem: Education: Goal: Knowledge of [place] [facility] Education information/materials will improve Outcome: Progressing Goal: Emotional status will improve Outcome: Progressing Goal: Mental status will improve Outcome: Progressing Goal: Verbalization of understanding the information provided will improve Outcome: Progressing   Problem: Health Behavior/Discharge Planning: Goal: Identification of resources available to assist in meeting health care needs will improve Outcome: Progressing   Problem: Education: Goal: Will be free of psychotic symptoms Outcome: Progressing Goal: Knowledge of the prescribed therapeutic regimen will improve Outcome: Progressing   Problem: Coping: Goal: Coping ability will improve Outcome: Progressing Goal: Will verbalize feelings Outcome: Progressing

## 2017-10-12 NOTE — Progress Notes (Signed)
D:  Patient stated slept good last night .Stated appetite is good and energy level  Is normal. Stated concentration is good . Stated on Depression scale 4 , hopeless 4 and anxiety 4 .( low 0-10 high) Denies suicidal  homicidal ideations  .  No auditory hallucinations  No pain concerns . Appropriate ADL'S. Interacting with peers and staff.  A: Encourage patient participation with unit programming . Instruction  Given on  Medication , verbalize understandingPatient voice her goal to find out what is real and fake and take the fake away. Patient met with Treatment Team  Instructed on medication changes.V;oice of diarrhea last night but none today . R: Voice no other concerns. Staff continue to monitor Patient  informed of information regarding education on [facility] . Emotional  and mental status improved Attending  unit programing  allowing for verbalization of feelings  compliant  with  medication .

## 2017-10-12 NOTE — Telephone Encounter (Signed)
Amy called from [facility] home in  reference to [facility] regarding a hospital admission for major depressive order with psychotic episode and confirm that she is a established patient here.

## 2017-10-13 MED ORDER — ZOLPIDEM TARTRATE 5 MG PO TABS
5.0000 mg | ORAL_TABLET | Freq: Every evening | ORAL | Status: DC | PRN
  Administered 2017-10-13 – 2017-10-14 (×2): 5 mg via ORAL
  Filled 2017-10-13 (×2): qty 1

## 2017-10-13 MED ORDER — FLUTICASONE PROPIONATE 50 MCG/ACT NA SUSP
2.0000 | Freq: Every day | NASAL | Status: DC
  Administered 2017-10-13 – 2017-10-14 (×3): 2 via NASAL
  Filled 2017-10-13: qty 16

## 2017-10-13 MED ORDER — HALOPERIDOL 5 MG PO TABS
5.0000 mg | ORAL_TABLET | Freq: Every day | ORAL | Status: DC
  Administered 2017-10-13 – 2017-10-14 (×2): 5 mg via ORAL
  Filled 2017-10-13 (×2): qty 1

## 2017-10-13 NOTE — Progress Notes (Signed)
Recreation Therapy Notes  Date: 10/13/2017  Time: 9:30 am  Location: Craft Room  Behavioral response: Appropriate  Intervention Topic: Communication  Discussion/Intervention:  Group content today was focused on communication. The group defined communication and ways to communicate with others. Individuals stated reason why communication is important and some reasons to communicate with others. Patients expressed if they thought they were good at communicating with others and ways they could improve their communication skills. The group identified important parts of communication and some experiences they have had in the past with communication. The group participated in the intervention "What is that?", where they had a chance to test out their communication skills and identify ways to improve their communication techniques.  Clinical Observations/Feedback:  Patient came to group and stated that she normally communicates with others by using greeting cards and mailing letters. She participated in the intervention during group and was social with peers and staff.   Natori Gudino LRT/CTRS         Refugio Mcconico 10/13/2017 11:40 AM

## 2017-10-13 NOTE — [facility] Group Notes (Signed)
LCSW Group Therapy Note  10/13/2017 1:00 pm  Type of Therapy/Topic:  Group Therapy:  Balance in Life  Participation Level:  Active  Description of Group:    This group will address the concept of balance and how it feels and looks when one is unbalanced. Patients will be encouraged to process areas in their lives that are out of balance and identify reasons for remaining unbalanced. Facilitators will guide patients in utilizing problem-solving interventions to address and correct the stressor making their life unbalanced. Understanding and applying boundaries will be explored and addressed for obtaining and maintaining a balanced life. Patients will be encouraged to explore ways to assertively make their unbalanced needs known to significant others in their lives, using other group members and facilitator for support and feedback.  Therapeutic Goals: 1. Patient will identify two or more emotions or situations they have that consume much of in their lives. 2. Patient will identify signs/triggers that life has become out of balance:  3. Patient will identify two ways to set boundaries in order to achieve balance in their lives:  4. Patient will demonstrate ability to communicate their needs through discussion and/or role plays  Summary of Patient Progress: Malli actively participated in today's group discussion on balance in life.  Karmela shared that the emotion that consumes much of her life is hopelessness.  Eliska shared that triggers/signs of unbalance is when she starts to withdraw from others and not leave her home.  Roberta shared that she often sets boundaries with friends and family to achieve balance which includes stopping conversations that she feels are not helpful to her.     Therapeutic Modalities:   Cognitive Behavioral Therapy Solution-Focused Therapy Assertiveness Training  Devona Konig, [place] 10/13/2017 4:18 PM

## 2017-10-13 NOTE — [facility] Group Notes (Signed)
LCSW Group Therapy Note 10/13/2017 9:00 AM  Type of Therapy and Topic:  Group Therapy:  Setting Goals  Participation Level:  Active  Description of Group: In this process group, patients discussed using strengths to work toward goals and address challenges.  Patients identified two positive things about themselves and one goal they were working on.  Patients were given the opportunity to share openly and support each other's plan for self-empowerment.  The group discussed the value of gratitude and were encouraged to have a daily reflection of positive characteristics or circumstances.  Patients were encouraged to identify a plan to utilize their strengths to work on current challenges and goals.  Therapeutic Goals 1. Patient will verbalize personal strengths/positive qualities and relate how these can assist with achieving desired personal goals 2. Patients will verbalize affirmation of peers plans for personal change and goal setting 3. Patients will explore the value of gratitude and positive focus as related to successful achievement of goals 4. Patients will verbalize a plan for regular reinforcement of personal positive qualities and circumstances.  Summary of Patient Progress: Stacey Boyd actively participated in today's discussion on setting goals using the SMART Model.  Stacey Boyd shared that her personal strength is that she tries to stay focused on the present and tell herself that she cannot change the past.  Stacey Boyd shared that she has a personal goal of losing some weight which is relevant as she would like to remain off of medications.  Stacey Boyd shared that she uses meditation and the support that she receives from friends and family to help her be successful in meeting the goal that she has established when she gets back into her community.      Therapeutic Modalities Cognitive Behavioral Therapy Motivational Interviewing    Devona Konig, [place] 10/13/2017 2:35 PM

## 2017-10-13 NOTE — Plan of Care (Signed)
Data: Patient is appropriate and cooperative to assessment. Patient denies SI/HI and endorses AH. Patient has completed daily self inventory worksheet. Patient has complaints of head ache related to AH. Patient reports sounds of screaming and yelling throughout the night, and a pain rating of 6/10. Patient reports poor sleep quality, appetite is good. Patient rates depression "6/10" , feelings of hopelessness "6/10" and anxiety "4/10" Patients goal for today is "Feeling Better about things."   Action:  Q x 15 minute observation checks were completed for safety. Patient was provided with education on medications. Patient was offered support and encouragement. Patient was given scheduled medications. Patient  was encourage to attend groups, participate in unit activities and continue with plan of care.      Response: Patient is medication complaint and is seen in groups today. Patient has no complaints at this time, PRN treatment for headache has been received well. Patient is receptive to treatment and safety maintained on unit.    Problem: Education: Goal: Knowledge of [place] [facility] Education information/materials will improve 10/13/2017 1020 by Alyson Locket I, RN Outcome: Progressing 10/13/2017 0957 by Alyson Locket I, RN Outcome: Progressing Goal: Verbalization of understanding the information provided will improve Outcome: Progressing   Problem: Education: Goal: Knowledge of the prescribed therapeutic regimen will improve 10/13/2017 1020 by Anson [place], RN Outcome: Progressing 10/13/2017 0957 by Alyson Locket I, RN Outcome: Progressing   Problem: Coping: Goal: Will verbalize feelings Outcome: Progressing

## 2017-10-13 NOTE — Progress Notes (Signed)
[facility] MD Progress Note  10/13/2017 1:37 PM Stacey Boyd  MRN:  [id number]  Subjective:    Stacey Boyd reports extremely poor sleep thaty was interrupted by suspicious noises at the nursing station. The patient believes that there was a party going on led by a man playing guitar. It is unclear if she had both, visual and auditory The night before, she could hear music while reading the Bible coming from the vent. She complains of sinus headache pain.   Principal Problem: Severe major depression, single episode, with psychotic features (Stacey Boyd) Diagnosis:   Patient Active Problem List   Diagnosis Date Noted  . Severe major depression, single episode, with psychotic features (Stacey Boyd) [F32.3] 10/10/2017    Priority: High  . Acute midline low back pain without sciatica [M54.5] 09/09/2017  . Sacral bruising, initial encounter [S30.0XXA] 09/09/2017  . Neoplasm of uncertain behavior of skin [D48.5] 09/09/2017  . Impaired fasting glucose [R73.01] 09/09/2017  . Vitamin D deficiency [E55.9] 09/09/2017  . Osteopenia [M85.80] 11/15/2014  . Screening for breast cancer [Z12.31] 09/24/2014  . Elevated serum creatinine [R79.89] 09/24/2014  . Pain in joint, lower leg [M25.569] 09/20/2013  . Routine general medical examination at a health care facility [Z00.00] 09/20/2013  . Hyperglycemia [R73.9] 03/15/2013  . Essential hypertension, benign [I10] 03/15/2013  . Pure hypercholesterolemia [E78.00] 03/15/2013  . OA (osteoarthritis) [M19.90] 03/15/2013  . Glaucoma [H40.9] 03/15/2013   Total Time spent with patient: 20 minutes  Past Psychiatric History: none  Past Medical History:  Past Medical History:  Diagnosis Date  . Allergy   . Arthritis   . Diabetes mellitus without complication ([place])    Pt states she is diet controlled.   . Glaucoma   . Hyperlipidemia   . Hypertension   . Wrist fracture 2001   right    Past Surgical History:  Procedure Laterality Date  . ABDOMINAL HYSTERECTOMY  2005  .  APPENDECTOMY  1980  . CARDIAC CATHETERIZATION Left 09/17/2015   Procedure: Left Heart Cath and Coronary Angiography;  Surgeon: Teodoro Spray, MD;  Location: [place] [facility];  Service: Cardiovascular;  Laterality: Left;   Family History:  Family History  Problem Relation Age of Onset  . Heart disease Mother        h/o rheumatic fever  . Heart disease Father   . Stroke Father   . Diabetes Sister   . Diabetes Brother   . Diabetes Sister   . Colon cancer Neg Hx   . Breast cancer Neg Hx    Family Psychiatric  History: none Social History:  Social History   Substance and Sexual Activity  Alcohol Use No     Social History   Substance and Sexual Activity  Drug Use No    Social History   Socioeconomic History  . Marital status: Divorced    Spouse name: Not on file  . Number of children: Not on file  . Years of education: Not on file  . Highest education level: Not on file  Occupational History  . Not on file  Social Needs  . Financial resource strain: Not on file  . Food insecurity:    Worry: Not on file    Inability: Not on file  . Transportation needs:    Medical: Not on file    Non-medical: Not on file  Tobacco Use  . Smoking status: Former Smoker    Last attempt to quit: 09/17/1979    Years since quitting: 38.0  . Smokeless tobacco:  Never Used  Substance and Sexual Activity  . Alcohol use: No  . Drug use: No  . Sexual activity: Not on file  Lifestyle  . Physical activity:    Days per week: Not on file    Minutes per session: Not on file  . Stress: Not on file  Relationships  . Social connections:    Talks on phone: Not on file    Gets together: Not on file    Attends religious service: Not on file    Active member of club or organization: Not on file    Attends meetings of clubs or organizations: Not on file    Relationship status: Not on file  Other Topics Concern  . Not on file  Social History Narrative   3 kids, local   Lives with daughter as  of 2016   Divorced ~2002   Additional Social History:                         Sleep: Poor  Appetite:  Fair  Current Medications: Current Facility-Administered Medications  Medication Dose Route Frequency Provider Last Rate Last Dose  . acetaminophen (TYLENOL) tablet 650 mg  650 mg Oral Q6H PRN Clapacs, Madie Reno, MD   650 mg at 10/13/17 2130  . alum & mag hydroxide-simeth (MAALOX/MYLANTA) 200-200-20 MG/5ML suspension 30 mL  30 mL Oral Q4H PRN Clapacs, John T, MD      . atorvastatin (LIPITOR) tablet 10 mg  10 mg Oral q1800 Clapacs, Madie Reno, MD   10 mg at 10/12/17 1701  . fluticasone (FLONASE) 50 MCG/ACT nasal spray 2 spray  2 spray Each Nare QHS Kima Malenfant B, MD      . haloperidol (HALDOL) tablet 5 mg  5 mg Oral QHS Nehemie Casserly B, MD      . hydrochlorothiazide (MICROZIDE) capsule 12.5 mg  12.5 mg Oral Daily Clapacs, John T, MD   12.5 mg at 10/13/17 8657  . latanoprost (XALATAN) 0.005 % ophthalmic solution 1 drop  1 drop Both Eyes QHS Clapacs, Madie Reno, MD   1 drop at 10/12/17 2134  . lisinopril (PRINIVIL,ZESTRIL) tablet 20 mg  20 mg Oral Daily Clapacs, Madie Reno, MD   20 mg at 10/13/17 8469  . loratadine (CLARITIN) tablet 10 mg  10 mg Oral Daily Clapacs, Madie Reno, MD   10 mg at 10/13/17 6295  . magnesium hydroxide (MILK OF MAGNESIA) suspension 30 mL  30 mL Oral Daily PRN Clapacs, John T, MD      . propranolol (INDERAL) tablet 20 mg  20 mg Oral BID Jovann Luse B, MD   20 mg at 10/13/17 2841  . timolol (TIMOPTIC) 0.5 % ophthalmic solution 1 drop  1 drop Both Eyes Daily Clapacs, John T, MD   1 drop at 10/13/17 3244  . zolpidem (AMBIEN) tablet 5 mg  5 mg Oral QHS PRN Tarika Mckethan B, MD        Lab Results: No results found for this or any previous visit (from the past 48 hour(s)).  Blood Alcohol level:  Lab Results  Component Value Date   ETH <10 05/19/7251    Metabolic Disorder Labs: Lab Results  Component Value Date   HGBA1C 6.3 (H) 10/10/2017   MPG  134.11 10/10/2017   No results found for: PROLACTIN Lab Results  Component Value Date   CHOL 188 10/10/2017   TRIG 107 10/10/2017   HDL 43 10/10/2017   CHOLHDL 4.4 10/10/2017  VLDL 21 10/10/2017   LDLCALC 124 (H) 10/10/2017   LDLCALC 105 (H) 08/22/2017    Physical Findings: AIMS:  , ,  ,  ,    CIWA:    COWS:     Musculoskeletal: Strength & Muscle Tone: within normal limits Gait & Station: normal Patient leans: N/A  Psychiatric Specialty Exam: Physical Exam  Nursing note and vitals reviewed. Psychiatric: Her speech is normal. Judgment normal. Her mood appears anxious. She is actively hallucinating. Thought content is paranoid and delusional. Cognition and memory are normal.    Review of Systems  Neurological: Positive for headaches.  Psychiatric/Behavioral: Positive for hallucinations. The patient has insomnia.   All other systems reviewed and are negative.   Blood pressure 137/72, pulse 79, temperature (!) 97.5 F (36.4 C), temperature source Oral, resp. rate 18, height 5' 2.6" (1.59 m), weight 76.2 kg (168 lb), SpO2 99 %.Body mass index is 30.14 kg/m.  General Appearance: Casual  Eye Contact:  Good  Speech:  Clear and Coherent  Volume:  Normal  Mood:  Anxious  Affect:  Flat  Thought Process:  Goal Directed and Descriptions of Associations: Intact  Orientation:  Full (Time, Place, and Person)  Thought Content:  Delusions, Hallucinations: Auditory Visual and Paranoid Ideation  Suicidal Thoughts:  No  Homicidal Thoughts:  No  Memory:  Immediate;   Fair Recent;   Fair Remote;   Fair  Judgement:  Poor  Insight:  Lacking  Psychomotor Activity:  Normal  Concentration:  Concentration: Fair and Attention Span: Fair  Recall:  [employer] of Knowledge:  Fair  Language:  Fair  Akathisia:  No  Handed:  Right  AIMS (if indicated):     Assets:  Communication Skills Desire for Improvement Financial Resources/Insurance Housing Physical Health Resilience Social  Support Transportation  ADL's:  Intact  Cognition:  WNL  Sleep:  Number of Hours: 6.5     Treatment Plan Summary: Daily contact with patient to assess and evaluate symptoms and progress in treatment and Medication management   Stacey Boyd is a 72 year old female with no past psychiatric history admitted for a psychotic break with auditory and visual hallucinations and paranoid delusions.   #Mood/psychosis -discontinue Abilify -start Haldol 5 mg nightly  #Insomnia  -discontinue Trazodone -Ambien 5 mg nightly PRN  #Allergies -Claritin 10 mg daily -Flonase nightly  #HTN -continue HCTZ 12.5 mg, Lisinopril 20 mg and Propranolol 20 mg BID  #Dyslipidemia -Lipitor 10 mg  #Rule out UTI -urine culture is negative  #Labs -lipid panel, TSH and A1C are unremarkable -EKG, reviewed QTc 473 -head CT scan negative  #Glaucoma -continue eye drops  #Disposition -discharge to home -follow up with her PCP     Orson Slick, MD 10/13/2017, 1:37 PM

## 2017-10-13 NOTE — Plan of Care (Addendum)
Patient found with visitor upon my arrival. Patient is visible and social throughout the evening. Continues to report "a racket" coming from walls in room. Denies depression. Denies SI/HI/VH. Denies pain. Given first doses of nasal spray, haldol and zolpidem administered tonight. Tolerated well. Education provided. Verbalizes understanding. Reinforcement necessary as memory and processing is impaired at times. Reports eating and voiding adequately. Compliant with HS medications and staff direction. Q 15 minute checks maintained. Will continue to monitor throughout the shift. Patient slept 7.5 hours. No apparent distress. Will endorse care to oncoming shift.  Problem: Education: Goal: Emotional status will improve Outcome: Progressing Goal: Verbalization of understanding the information provided will improve Outcome: Progressing   Problem: Elimination: Goal: Will not experience complications related to bowel motility Outcome: Progressing   Problem: Education: Goal: Knowledge of the prescribed therapeutic regimen will improve Outcome: Progressing   Problem: Coping: Goal: Coping ability will improve Outcome: Progressing Goal: Will verbalize feelings Outcome: Progressing   Problem: Education: Goal: Will be free of psychotic symptoms Outcome: Not Progressing

## 2017-10-14 NOTE — Plan of Care (Signed)
Patient is alert and oriented X 4. Denies SI, HI and this morning she denies hearing voices. Patient states she slept really well last. "I don't remember what I took but it really helped me I didn't hear any voices at all." Patient is very pleasant and animated. Patient is attending groups and appropriate to staff and peers. Patient is capable of voicing feelings and mental status is improving. Patient is compliant with medications, eating meals regularly, no issues with bowels nurse held Blood pressure medication this morning due to low reading. Nurse will continue to monitor. Problem: Education: Goal: Knowledge of [facility] Education information/materials will improve Outcome: Progressing Goal: Emotional status will improve Outcome: Progressing Goal: Mental status will improve Outcome: Progressing Goal: Verbalization of understanding the information provided will improve Outcome: Progressing   Problem: Health Behavior/Discharge Planning: Goal: Identification of resources available to assist in meeting health care needs will improve Outcome: Progressing   Problem: Elimination: Goal: Will not experience complications related to bowel motility Outcome: Progressing

## 2017-10-14 NOTE — Progress Notes (Signed)
   10/14/17 1505  Clinical Encounter Type  Visited With Patient  Visit Type Initial (group)   Patient participated in chaplain's spirituality group, building rapport and meditation.

## 2017-10-14 NOTE — Progress Notes (Signed)
[facility] MD Progress Note  10/14/2017 1:32 PM [name]  MRN:  [id number]  Subjective:    Stacey Boyd denies any hallucinations today and reports excellent sleep. According to the nursing staff, she has been troubled by the voices. She accepts medications, reports no side effects and participates in programming.  Principal Problem: Severe major depression, single episode, with psychotic features ([name]) Diagnosis:   Patient Active Problem List   Diagnosis Date Noted  . Severe major depression, single episode, with psychotic features ([name]) [F32.3] 10/10/2017    Priority: High  . Acute midline low back pain without sciatica [M54.5] 09/09/2017  . Sacral bruising, initial encounter [S30.0XXA] 09/09/2017  . Neoplasm of uncertain behavior of skin [D48.5] 09/09/2017  . Impaired fasting glucose [R73.01] 09/09/2017  . Vitamin D deficiency [E55.9] 09/09/2017  . Osteopenia [M85.80] 11/15/2014  . Screening for breast cancer [Z12.31] 09/24/2014  . Elevated serum creatinine [R79.89] 09/24/2014  . Pain in joint, lower leg [M25.569] 09/20/2013  . Routine general medical examination at a health care facility [Z00.00] 09/20/2013  . Hyperglycemia [R73.9] 03/15/2013  . Essential hypertension, benign [I10] 03/15/2013  . Pure hypercholesterolemia [E78.00] 03/15/2013  . OA (osteoarthritis) [M19.90] 03/15/2013  . Glaucoma [H40.9] 03/15/2013   Total Time spent with patient: 20 minutes  Past Psychiatric History: none  Past Medical History:  Past Medical History:  Diagnosis Date  . Allergy   . Arthritis   . Diabetes mellitus without complication ([place])    Pt states she is diet controlled.   . Glaucoma   . Hyperlipidemia   . Hypertension   . Wrist fracture 2001   right    Past Surgical History:  Procedure Laterality Date  . ABDOMINAL HYSTERECTOMY  2005  . APPENDECTOMY  1980  . CARDIAC CATHETERIZATION Left 09/17/2015   Procedure: Left Heart Cath and Coronary Angiography;  Surgeon: Teodoro Spray,  MD;  Location: [place] [facility];  Service: Cardiovascular;  Laterality: Left;   Family History:  Family History  Problem Relation Age of Onset  . Heart disease Mother        h/o rheumatic fever  . Heart disease Father   . Stroke Father   . Diabetes Sister   . Diabetes Brother   . Diabetes Sister   . Colon cancer Neg Hx   . Breast cancer Neg Hx    Family Psychiatric  History: none Social History:  Social History   Substance and Sexual Activity  Alcohol Use No     Social History   Substance and Sexual Activity  Drug Use No    Social History   Socioeconomic History  . Marital status: Divorced    Spouse name: Not on file  . Number of children: Not on file  . Years of education: Not on file  . Highest education level: Not on file  Occupational History  . Not on file  Social Needs  . Financial resource strain: Not on file  . Food insecurity:    Worry: Not on file    Inability: Not on file  . Transportation needs:    Medical: Not on file    Non-medical: Not on file  Tobacco Use  . Smoking status: Former Smoker    Last attempt to quit: 09/17/1979    Years since quitting: 38.1  . Smokeless tobacco: Never Used  Substance and Sexual Activity  . Alcohol use: No  . Drug use: No  . Sexual activity: Not on file  Lifestyle  . Physical activity:  Days per week: Not on file    Minutes per session: Not on file  . Stress: Not on file  Relationships  . Social connections:    Talks on phone: Not on file    Gets together: Not on file    Attends religious service: Not on file    Active member of club or organization: Not on file    Attends meetings of clubs or organizations: Not on file    Relationship status: Not on file  Other Topics Concern  . Not on file  Social History Narrative   3 kids, local   Lives with daughter as of 2016   Divorced ~2002   Additional Social History:                         Sleep: Fair  Appetite:  Fair  Current  Medications: Current Facility-Administered Medications  Medication Dose Route Frequency Provider Last Rate Last Dose  . acetaminophen (TYLENOL) tablet 650 mg  650 mg Oral Q6H PRN Clapacs, Madie Reno, MD   650 mg at 10/13/17 0254  . alum & mag hydroxide-simeth (MAALOX/MYLANTA) 200-200-20 MG/5ML suspension 30 mL  30 mL Oral Q4H PRN Clapacs, John T, MD      . atorvastatin (LIPITOR) tablet 10 mg  10 mg Oral q1800 Clapacs, Madie Reno, MD   10 mg at 10/13/17 1728  . fluticasone (FLONASE) 50 MCG/ACT nasal spray 2 spray  2 spray Each Nare QHS Wesson Stith B, MD   2 spray at 10/14/17 0828  . haloperidol (HALDOL) tablet 5 mg  5 mg Oral QHS Shanterria Franta B, MD   5 mg at 10/13/17 2127  . hydrochlorothiazide (MICROZIDE) capsule 12.5 mg  12.5 mg Oral Daily Clapacs, John T, MD   12.5 mg at 10/14/17 0825  . latanoprost (XALATAN) 0.005 % ophthalmic solution 1 drop  1 drop Both Eyes QHS Clapacs, Madie Reno, MD   1 drop at 10/13/17 2127  . lisinopril (PRINIVIL,ZESTRIL) tablet 20 mg  20 mg Oral Daily Clapacs, Madie Reno, MD   20 mg at 10/14/17 0825  . loratadine (CLARITIN) tablet 10 mg  10 mg Oral Daily Clapacs, Madie Reno, MD   10 mg at 10/14/17 0825  . magnesium hydroxide (MILK OF MAGNESIA) suspension 30 mL  30 mL Oral Daily PRN Clapacs, John T, MD      . propranolol (INDERAL) tablet 20 mg  20 mg Oral BID Chang Tiggs B, MD   20 mg at 10/14/17 0825  . timolol (TIMOPTIC) 0.5 % ophthalmic solution 1 drop  1 drop Both Eyes Daily Clapacs, John T, MD   1 drop at 10/14/17 0826  . zolpidem (AMBIEN) tablet 5 mg  5 mg Oral QHS PRN Lylia Karn B, MD   5 mg at 10/13/17 2127    Lab Results: No results found for this or any previous visit (from the past 48 hour(s)).  Blood Alcohol level:  Lab Results  Component Value Date   ETH <10 27/10/2374    Metabolic Disorder Labs: Lab Results  Component Value Date   HGBA1C 6.3 (H) 10/10/2017   MPG 134.11 10/10/2017   No results found for: PROLACTIN Lab Results   Component Value Date   CHOL 188 10/10/2017   TRIG 107 10/10/2017   HDL 43 10/10/2017   CHOLHDL 4.4 10/10/2017   VLDL 21 10/10/2017   LDLCALC 124 (H) 10/10/2017   LDLCALC 105 (H) 08/22/2017    Physical Findings: AIMS:  , ,  ,  ,  CIWA:    COWS:     Musculoskeletal: Strength & Muscle Tone: within normal limits Gait & Station: normal Patient leans: N/A  Psychiatric Specialty Exam: Physical Exam  Nursing note and vitals reviewed. Psychiatric: She has a normal mood and affect. Her speech is normal. She is actively hallucinating. Thought content is paranoid and delusional. Cognition and memory are normal. She expresses inappropriate judgment.    Review of Systems  Neurological: Negative.   Psychiatric/Behavioral: Positive for hallucinations.  All other systems reviewed and are negative.   Blood pressure 105/66, pulse 69, temperature 98 F (36.7 C), temperature source Oral, resp. rate 16, height 5' 2.6" (1.59 m), weight 76.2 kg (168 lb), SpO2 100 %.Body mass index is 30.14 kg/m.  General Appearance: Casual  Eye Contact:  Good  Speech:  Clear and Coherent  Volume:  Normal  Mood:  Euthymic  Affect:  Appropriate  Thought Process:  Goal Directed and Descriptions of Associations: Intact  Orientation:  Full (Time, Place, and Person)  Thought Content:  Delusions, Hallucinations: Auditory and Paranoid Ideation  Suicidal Thoughts:  No  Homicidal Thoughts:  No  Memory:  Immediate;   Fair Recent;   Fair Remote;   Fair  Judgement:  Poor  Insight:  Lacking  Psychomotor Activity:  Normal  Concentration:  Concentration: Fair and Attention Span: Fair  Recall:  [employer] of Knowledge:  Fair  Language:  Fair  Akathisia:  No  Handed:  Right  AIMS (if indicated):     Assets:  Communication Skills Desire for Improvement Financial Resources/Insurance Housing Physical Health Resilience Social Support  ADL's:  Intact  Cognition:  WNL  Sleep:  Number of Hours: 7.5      Treatment Plan Summary: Daily contact with patient to assess and evaluate symptoms and progress in treatment and Medication management   Ms. Soohoo is a 72 year old female with no past psychiatric history admitted for a psychotic break with auditory and visual hallucinations and paranoid delusions.   #Mood/psychosis, improving -continue Haldol 5 mg nightly  #Insomnia -continue Ambien 5 mg nightly PRN  #Allergies, resolved with treatment -Claritin 10 mg daily -Flonase nightly  #HTN -continue HCTZ 12.5 mg, Lisinopril 20 mg and Propranolol 20 mg BID  #Dyslipidemia -Lipitor 10 mg  #Rule out UTI -urine cultureis negative  #Labs -lipid panel, TSH and A1Care unremarkable -EKG, reviewed QTc 473 -head CT scan negative  #Glaucoma -continue eye drops  #Disposition -discharge to home -follow upwith her PCP     Orson Slick, MD 10/14/2017, 1:32 PM

## 2017-10-14 NOTE — Progress Notes (Signed)
Recreation Therapy Notes  Date: 10/14/2017  Time: 9:30 am  Location: Craft Room  Behavioral response: Appropriate  Intervention Topic:  Leisure  Discussion/Intervention:  Group content today was focused on leisure. The group defined what leisure is and some positive leisure activities they participate in. Individuals identified the difference between good and bad leisure. Participants expressed how they feel after participating in the leisure of their choice. The group discussed how they go about picking a leisure activity and if others are involved in their leisure activities. The patient stated how many leisure activities they too choose from and reasons why it is important to have leisure time. Individuals participated in the intervention "Leisure Jeopardy" where they had a chance to identify new leisure activities as well as benefits of leisure. Clinical Observations/Feedback:  Patient came to group and identified sewing and crocheting as leisure activities she enjoys. Individual participated in the intervention and was social with peers and staff during group. Stacey Boyd LRT/CTRS         Stacey Boyd 10/14/2017 12:32 PM

## 2017-10-15 MED ORDER — ZOLPIDEM TARTRATE 5 MG PO TABS: 5.0000 mg | ORAL_TABLET | Freq: Every evening | ORAL | 0 refills | Status: DC | PRN

## 2017-10-15 MED ORDER — LORATADINE 10 MG PO TABS: 10.0000 mg | ORAL_TABLET | Freq: Every day | ORAL | 1 refills | Status: DC

## 2017-10-15 MED ORDER — FLUTICASONE PROPIONATE 50 MCG/ACT NA SUSP: 2.0000 | Freq: Every day | NASAL | 2 refills | Status: DC

## 2017-10-15 MED ORDER — HALOPERIDOL 5 MG PO TABS: 5.0000 mg | ORAL_TABLET | Freq: Every day | ORAL | 1 refills | Status: DC

## 2017-10-15 NOTE — BHH Suicide Risk Assessment (Signed)
[facility] Discharge Suicide Risk Assessment   Principal Problem: Delusional disorder [facility]) Discharge Diagnoses:  Patient Active Problem List   Diagnosis Date Noted  . Delusional disorder ([place]) [F22] 10/10/2017    Priority: High  . Acute midline low back pain without sciatica [M54.5] 09/09/2017  . Sacral bruising, initial encounter [S30.0XXA] 09/09/2017  . Neoplasm of uncertain behavior of skin [D48.5] 09/09/2017  . Impaired fasting glucose [R73.01] 09/09/2017  . Vitamin D deficiency [E55.9] 09/09/2017  . Osteopenia [M85.80] 11/15/2014  . Screening for breast cancer [Z12.31] 09/24/2014  . Elevated serum creatinine [R79.89] 09/24/2014  . Pain in joint, lower leg [M25.569] 09/20/2013  . Routine general medical examination at a health care facility [Z00.00] 09/20/2013  . Hyperglycemia [R73.9] 03/15/2013  . Essential hypertension, benign [I10] 03/15/2013  . Pure hypercholesterolemia [E78.00] 03/15/2013  . OA (osteoarthritis) [M19.90] 03/15/2013  . Glaucoma [H40.9] 03/15/2013    Total Time spent with patient: 20 minutes  Musculoskeletal: Strength & Muscle Tone: within normal limits Gait & Station: normal Patient leans: N/A  Psychiatric Specialty Exam: Review of Systems  Neurological: Negative.   Psychiatric/Behavioral: Negative.   All other systems reviewed and are negative.   Blood pressure (!) 115/59, pulse 70, temperature 97.7 F (36.5 C), temperature source Oral, resp. rate 18, height 5' 2.6" (1.59 m), weight 76.2 kg (168 lb), SpO2 100 %.Body mass index is 30.14 kg/m.  General Appearance: Casual  Eye Contact::  Good  Speech:  Clear and Coherent409  Volume:  Normal  Mood:  Euthymic  Affect:  Appropriate  Thought Process:  Goal Directed and Descriptions of Associations: Intact  Orientation:  Full (Time, Place, and Person)  Thought Content:  WDL  Suicidal Thoughts:  No  Homicidal Thoughts:  No  Memory:  Immediate;   Fair Recent;   Fair Remote;   Fair  Judgement:  Impaired   Insight:  Shallow  Psychomotor Activity:  Normal  Concentration:  Fair  Recall:  [employer] of Knowledge:Fair  Language: Fair  Akathisia:  No  Handed:  Right  AIMS (if indicated):     Assets:  Communication Skills Desire for Improvement Financial Resources/Insurance Housing Physical Health Resilience Social Support Transportation  Sleep:  Number of Hours: 6.3  Cognition: WNL  ADL's:  Intact   Mental Status Per Nursing Assessment::   On Admission:  NA  Demographic Factors:  Divorced or widowed, Caucasian and Living alone  Loss Factors: NA  Historical Factors: Impulsivity  Risk Reduction Factors:   Sense of responsibility to family, Positive social support and Positive therapeutic relationship  Continued Clinical Symptoms:  Schizophrenia:   Paranoid or undifferentiated type  Cognitive Features That Contribute To Risk:  None    Suicide Risk:  Minimal: No identifiable suicidal ideation.  Patients presenting with no risk factors but with morbid ruminations; may be classified as minimal risk based on the severity of the depressive symptoms  Follow-up Information    [facility] Follow up.        [facility] [place] [facility].   Contact information: [place] [address] [phone]           Plan Of Care/Follow-up recommendations:  Activity:  as tolerated Diet:  low sodium heart healthy Other:  keep follow up appointments  Orson Slick, MD 10/15/2017, 10:56 AM

## 2017-10-15 NOTE — Plan of Care (Signed)
Patient is alert and oriented X 4. Denies SI, HI and AVH. Patient states she slept really well and believes medication is helping her. Patient is very pleasant and is attending groups. Patient rates depression 0/10, hopelessness 0/10 and anxiety 0/10. Patient states her goal for today is to leave the past behind her and begin looking forward. Nurse will continue to monitor. Problem: Education: Goal: Knowledge of [place] [facility] Education information/materials will improve Outcome: Progressing Goal: Emotional status will improve Outcome: Progressing Goal: Mental status will improve Outcome: Progressing Goal: Verbalization of understanding the information provided will improve Outcome: Progressing   Problem: Health Behavior/Discharge Planning: Goal: Identification of resources available to assist in meeting health care needs will improve Outcome: Progressing   Problem: Elimination: Goal: Will not experience complications related to bowel motility Outcome: Progressing

## 2017-10-15 NOTE — Discharge Summary (Signed)
Physician Discharge Summary Note  Patient:  Stacey Boyd is an 72 y.o., female MRN:  [id number] DOB:  Sep 17, 1945 Patient phone:  [phone] (home)  Patient address:   [place] [address],  Total Time spent with patient: 20 minutes plus 15 min on care coordination and documentation  Date of Admission:  10/10/2017 Date of Discharge: 10/15/2017  Reason for Admission:  Psychotic break.  History of Present Illness:   Identifying data. Stacey Boyd is a 72 year old female with no past psychiatric history.   Chief complaint. "I want to get better."  History of present illness. Information was obtained from the patient and the chart. Stacey Boyd came to the ER afraid for her life. For the past year, she has been hearing noises and screams in the next door apartments. They persist in spite of changing her address three times. They frighten her and keep her up at night. On the day of admission however, she believed that two familiar female voices came to her door, tried to brake in to kill her to please her upstairs neighbor. She spend most of the night holding her finger on the lock to prevent intruders from coming in and apparently had a long conversation with the voices behind the door. She believes that all three neighbors from thre apartments she lives in belonged to the same religious group. They play music at night and engage in sex. She believes that everything started with allergy to cigarettes. Every time she changes her apartments, she has 2 weeks of quiet, then the noises start again. She stayed at her son's place for a month without suffering.   Past psychiatric history. She was briefly treated with Zoloft after hysterectomy.   Family psychiatric history. Her niece had a nervous breakdown in the face of severe events.  Social history. She is retired. She is very active with family, 8 great grand children, and volunteering. 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 She walks twice a day.    Principal Problem: Delusional disorder [facility]) Discharge Diagnoses: Patient Active Problem List   Diagnosis Date Noted  . Delusional disorder ([place]) [F22] 10/10/2017    Priority: High  . Acute midline low back pain without sciatica [M54.5] 09/09/2017  . Sacral bruising, initial encounter [S30.0XXA] 09/09/2017  . Neoplasm of uncertain behavior of skin [D48.5] 09/09/2017  . Impaired fasting glucose [R73.01] 09/09/2017  . Vitamin D deficiency [E55.9] 09/09/2017  . Osteopenia [M85.80] 11/15/2014  . Screening for breast cancer [Z12.31] 09/24/2014  . Elevated serum creatinine [R79.89] 09/24/2014  . Pain in joint, lower leg [M25.569] 09/20/2013  . Routine general medical examination at a health care facility [Z00.00] 09/20/2013  . Hyperglycemia [R73.9] 03/15/2013  . Essential hypertension, benign [I10] 03/15/2013  . Pure hypercholesterolemia [E78.00] 03/15/2013  . OA (osteoarthritis) [M19.90] 03/15/2013  . Glaucoma [H40.9] 03/15/2013    Past Medical History:  Past Medical History:  Diagnosis Date  . Allergy   . Arthritis   . Diabetes mellitus without complication ([place])    Pt states she is diet controlled.   . Glaucoma   . Hyperlipidemia   . Hypertension   . Wrist fracture 2001   right    Past Surgical History:  Procedure Laterality Date  . ABDOMINAL HYSTERECTOMY  2005  . APPENDECTOMY  1980  . CARDIAC CATHETERIZATION Left 09/17/2015   Procedure: Left Heart Cath and Coronary Angiography;  Surgeon: Teodoro Spray, MD;  Location: [facility];  Service: Cardiovascular;  Laterality: Left;   Family History:  Family History  Problem Relation Age of Onset  . Heart disease Mother        h/o rheumatic fever  . Heart disease Father   . Stroke Father   . Diabetes Sister   . Diabetes Brother   . Diabetes Sister   . Colon cancer Neg Hx   . Breast cancer Neg Hx    Social History:  Social History   Substance and Sexual Activity  Alcohol  Use No     Social History   Substance and Sexual Activity  Drug Use No    Social History   Socioeconomic History  . Marital status: Divorced    Spouse name: Not on file  . Number of children: Not on file  . Years of education: Not on file  . Highest education level: Not on file  Occupational History  . Not on file  Social Needs  . Financial resource strain: Not on file  . Food insecurity:    Worry: Not on file    Inability: Not on file  . Transportation needs:    Medical: Not on file    Non-medical: Not on file  Tobacco Use  . Smoking status: Former Smoker    Last attempt to quit: 09/17/1979    Years since quitting: 38.1  . Smokeless tobacco: Never Used  Substance and Sexual Activity  . Alcohol use: No  . Drug use: No  . Sexual activity: Not on file  Lifestyle  . Physical activity:    Days per week: Not on file    Minutes per session: Not on file  . Stress: Not on file  Relationships  . Social connections:    Talks on phone: Not on file    Gets together: Not on file    Attends religious service: Not on file    Active member of club or organization: Not on file    Attends meetings of clubs or organizations: Not on file    Relationship status: Not on file  Other Topics Concern  . Not on file  Social History Narrative   3 kids, local   Lives with daughter as of 2016   Divorced ~2002    Hospital Course:    Stacey Boyd is a 72 year old female with no past psychiatric history admitted for a psychotic breakwith auditory and visual hallucinations and paranoid delusions.She accepted medications and tolerated them well. All her symptoms have resolved.   #Mood/psychosis, improved -continue Haldol 5 mg nightly  #Insomnia -continue Ambien 5 mg nightly PRN  #Allergies, resolved with treatment -Claritin 10 mg daily -Flonase nightly  #HTN -continue HCTZ 12.5 mg, Lisinopril 20 mg and Propranolol 20 mg BID  #Dyslipidemia -Lipitor 10 mg  #Rule out  UTI -urine culturewas negative  #Labs -lipid panel, TSH and A1Care unremarkable -EKG, reviewed QTc 473 -head CT scan negative  #Glaucoma -continue eye drops  #Disposition -discharge to home -follow upwith her PCP and CBC for medication management     Physical Findings: AIMS:  , ,  ,  ,    CIWA:    COWS:     Musculoskeletal: Strength & Muscle Tone: within normal limits Gait & Station: normal Patient leans: N/A  Psychiatric Specialty Exam: Physical Exam  Nursing note and vitals reviewed. Psychiatric: She has a normal mood and affect. Her speech is normal and behavior is normal. Judgment and thought content normal. Cognition and memory are normal.    Review of Systems  Neurological:  Negative.   Psychiatric/Behavioral: Negative.   All other systems reviewed and are negative.   Blood pressure (!) 115/59, pulse 70, temperature 97.7 F (36.5 C), temperature source Oral, resp. rate 18, height 5' 2.6" (1.59 m), weight 76.2 kg (168 lb), SpO2 100 %.Body mass index is 30.14 kg/m.  General Appearance: Casual  Eye Contact:  Good  Speech:  Clear and Coherent  Volume:  Normal  Mood:  Euthymic  Affect:  Appropriate  Thought Process:  Goal Directed and Descriptions of Associations: Intact  Orientation:  Full (Time, Place, and Person)  Thought Content:  WDL  Suicidal Thoughts:  No  Homicidal Thoughts:  No  Memory:  Immediate;   Fair Recent;   Fair Remote;   Fair  Judgement:  Impaired  Insight:  Shallow  Psychomotor Activity:  Normal  Concentration:  Concentration: Fair and Attention Span: Fair  Recall:  [employer] of Knowledge:  Fair  Language:  Fair  Akathisia:  No  Handed:  Right  AIMS (if indicated):     Assets:  Communication Skills Desire for Improvement Financial Resources/Insurance Housing Physical Health Resilience Social Support Transportation  ADL's:  Intact  Cognition:  WNL  Sleep:  Number of Hours: 6.3     Have you used any form of  tobacco in the last 30 days? (Cigarettes, Smokeless Tobacco, Cigars, and/or Pipes): No  Has this patient used any form of tobacco in the last 30 days? (Cigarettes, Smokeless Tobacco, Cigars, and/or Pipes) Yes, No  Blood Alcohol level:  Lab Results  Component Value Date   ETH <10 11/91/4782    Metabolic Disorder Labs:  Lab Results  Component Value Date   HGBA1C 6.3 (H) 10/10/2017   MPG 134.11 10/10/2017   No results found for: PROLACTIN Lab Results  Component Value Date   CHOL 188 10/10/2017   TRIG 107 10/10/2017   HDL 43 10/10/2017   CHOLHDL 4.4 10/10/2017   VLDL 21 10/10/2017   LDLCALC 124 (H) 10/10/2017   LDLCALC 105 (H) 08/22/2017    See Psychiatric Specialty Exam and Suicide Risk Assessment completed by Attending Physician prior to discharge.  Discharge destination:  Home  Is patient on multiple antipsychotic therapies at discharge:  No   Has Patient had three or more failed trials of antipsychotic monotherapy by history:  No  Recommended Plan for Multiple Antipsychotic Therapies: NA  Discharge Instructions    Diet - low sodium heart healthy   Complete by:  As directed    Increase activity slowly   Complete by:  As directed      Allergies as of 10/15/2017      Reactions   Codeine Hives, Nausea And Vomiting      Medication List    STOP taking these medications   azelastine 0.1 % nasal spray Commonly known as:  ASTELIN   fexofenadine 180 MG tablet Commonly known as:  ALLEGRA     TAKE these medications     Indication  atorvastatin 10 MG tablet Commonly known as:  LIPITOR TAKE 1 TABLET (10 MG TOTAL) BY MOUTH DAILY.  Indication:  High Amount of Fats in the Blood   fluticasone 50 MCG/ACT nasal spray Commonly known as:  FLONASE Place 2 sprays into both nostrils at bedtime.  Indication:  Signs and Symptoms of Nose Diseases   haloperidol 5 MG tablet Commonly known as:  HALDOL Take 1 tablet (5 mg total) by mouth at bedtime.  Indication:  Psychosis    ibuprofen 200 MG tablet Commonly known  as:  ADVIL,MOTRIN Take 400 mg by mouth every 6 (six) hours as needed for pain. Reported on 09/17/2015  Indication:  Mild to Moderate Pain   latanoprost 0.005 % ophthalmic solution Commonly known as:  XALATAN Place 1 drop into both eyes at bedtime.  Indication:  Persistently Increased Pressure in the Eye   lisinopril-hydrochlorothiazide 20-12.5 MG tablet Commonly known as:  PRINZIDE,ZESTORETIC Take 1 tablet by mouth daily. MUST SCHEDULE ANNUAL EXAM FOR FURTHER REFILLS  Indication:  High Blood Pressure Disorder   loratadine 10 MG tablet Commonly known as:  CLARITIN Take 1 tablet (10 mg total) by mouth daily. Start taking on:  10/16/2017  Indication:  Hayfever   PRESERVISION AREDS 2 Caps Take 1 capsule by mouth daily.  Indication:  general health   propranolol 40 MG tablet Commonly known as:  INDERAL Take 20 mg by mouth 2 (two) times daily.  Indication:  High Blood Pressure Disorder   rOPINIRole 2 MG tablet Commonly known as:  REQUIP TAKE 1 TABLET (2 MG TOTAL) BY MOUTH NIGHTLY.  Indication:  Restless Leg Syndrome   timolol 0.5 % ophthalmic solution Commonly known as:  TIMOPTIC INSTILL ONE DROP INTO BOTH EYES DAILY  Indication:  Persistently Increased Pressure in the Eye   Vitamin D3 5000 units Tabs Take 5,000 Units by mouth daily.  Indication:  general health   zolpidem 5 MG tablet Commonly known as:  AMBIEN Take 1 tablet (5 mg total) by mouth at bedtime as needed for sleep.  Indication:  Trouble Sleeping      Follow-up Information    [facility] Follow up.        [facility] [place] [facility].   Contact information: [place] [address] [phone]           Follow-up recommendations:  Activity:  as tolerated Diet:  low sodium heart healthy Other:  keep follow up appointments  Comments:     Signed: Orson Slick, MD 10/15/2017, 10:59 AM

## 2017-10-15 NOTE — Plan of Care (Signed)
Patient is responding well to treatment and had a visitor during the day, her affect is bright and maintaining a good coping skill and able to voice positive attributes of self, compliance with her therapeutic regimen and contract for safety of self, states appetite is good and well hydrated , sleep long hours with out interruptions, 15 minute rounding is maintained no distress. Problem: Education: Goal: Knowledge of [place] [facility] Education information/materials will improve Outcome: Progressing Goal: Emotional status will improve Outcome: Progressing Goal: Mental status will improve Outcome: Progressing Goal: Verbalization of understanding the information provided will improve Outcome: Progressing   Problem: Health Behavior/Discharge Planning: Goal: Identification of resources available to assist in meeting health care needs will improve Outcome: Progressing   Problem: Elimination: Goal: Will not experience complications related to bowel motility Outcome: Progressing   Problem: Activity: Goal: Will verbalize the importance of balancing activity with adequate rest periods Outcome: Progressing   Problem: Education: Goal: Will be free of psychotic symptoms Outcome: Progressing Goal: Knowledge of the prescribed therapeutic regimen will improve Outcome: Progressing   Problem: Coping: Goal: Coping ability will improve Outcome: Progressing Goal: Will verbalize feelings Outcome: Progressing

## 2017-10-15 NOTE — Progress Notes (Signed)
  [facility] Adult Case Management Discharge Plan :  Will you be returning to the same living situation after discharge:  Yes,  pt is going back to her roommate's apartment At discharge, do you have transportation home?: Yes,  pt's roommate Stacey Boyd) is picking her up Do you have the ability to pay for your medications: Yes,  pt has [facility]  Release of information consent forms completed and in the chart;  Patient's signature needed at discharge.  Patient to Follow up at: Follow-up Information    [facility] Follow up.        [facility] [place] [facility].   Contact information: [place] [address] [phone]           Next level of care provider has access to [place] and Suicide Prevention discussed: Yes,  CSW discussed with pt  Have you used any form of tobacco in the last 30 days? (Cigarettes, Smokeless Tobacco, Cigars, and/or Pipes): No  Has patient been referred to the Quitline?: N/A patient is not a smoker  Patient has been referred for addiction treatment: N/A  Stacey Huebert  CUEBAS-COLON, LCSW 10/15/2017, 11:00 AM

## 2017-10-27 ENCOUNTER — Ambulatory Visit (INDEPENDENT_AMBULATORY_CARE_PROVIDER_SITE_OTHER): Payer: Medicare HMO | Admitting: Gastroenterology

## 2017-10-27 ENCOUNTER — Encounter (INDEPENDENT_AMBULATORY_CARE_PROVIDER_SITE_OTHER): Payer: Self-pay

## 2017-10-27 ENCOUNTER — Encounter: Payer: Self-pay | Admitting: Gastroenterology

## 2017-10-27 VITALS — BP 124/71 | HR 54 | Ht 63.0 in | Wt 175.0 lb

## 2017-10-27 DIAGNOSIS — R197 Diarrhea, unspecified: Secondary | ICD-10-CM | POA: Diagnosis not present

## 2017-10-27 NOTE — Progress Notes (Signed)
Jonathon Bellows MD, MRCP(U.K) [address]  [place]  [place], [place] [address]  Main: [phone]  Fax: [phone]   Gastroenterology Consultation  Referring Provider:     Lavera Guise, MD Primary Care Physician:  Ronnell Freshwater, NP Primary Gastroenterologist:  Dr. Jonathon Bellows  Reason for Consultation:     Diverticulosis        HPI:   Stacey Boyd is a 72 y.o. y/o female referred for consultation & management  by Dr. Ronnell Freshwater, NP.      CT abdomen 09/09/17 - Diverticulosis of the terminal ileum and sigmoid colon.   10/10/17- TSH elevated. Hb 13.9 grams   She says that she has been having diarrhea, on and off , ongoing since last year, Restarted yesterday , had upto 4 bowel movements , oatmeal consistency , foul smelling , no blood seen . In the past has seen . She has hemorrhoids. Last colonoscopy - never, has had stool cards in the past .   No family history of colon cancer or polyps. She consumes sweet n low daily , half a pack, mountain dew occasionally, no regular hard candy , lotw of gas and bloating, diarrhea is more pronounced when she has more gas.    She takes ibuprofen - for osteoporosis - 4 tabs this week and not on a daily basis. She takes allegra, vitamin d, no probiotics.   She consumes a lot of salad with bottles italian salad dressing,    Past Medical History:  Diagnosis Date  . Allergy   . Arthritis   . Diabetes mellitus without complication ([place])    Pt states she is diet controlled.   . Glaucoma   . Hyperlipidemia   . Hypertension   . Wrist fracture 2001   right    Past Surgical History:  Procedure Laterality Date  . ABDOMINAL HYSTERECTOMY  2005  . APPENDECTOMY  1980  . CARDIAC CATHETERIZATION Left 09/17/2015   Procedure: Left Heart Cath and Coronary Angiography;  Surgeon: Teodoro Spray, MD;  Location: [facility];  Service: Cardiovascular;  Laterality: Left;    Prior to Admission medications   Medication Sig  Start Date End Date Taking? Authorizing Provider  atorvastatin (LIPITOR) 10 MG tablet TAKE 1 TABLET (10 MG TOTAL) BY MOUTH DAILY. 09/29/15  Yes Tonia Ghent, MD  azelastine (ASTELIN) 0.1 % nasal spray Place into the nose. 03/09/17 03/09/18 Yes [provider]  Cholecalciferol (VITAMIN D3) 5000 units TABS Take 5,000 Units by mouth daily.    Yes [provider]  haloperidol (HALDOL) 5 MG tablet Take 1 tablet (5 mg total) by mouth at bedtime. 10/15/17  Yes Pucilowska, Jolanta B, MD  ibuprofen (ADVIL,MOTRIN) 200 MG tablet Take 400 mg by mouth every 6 (six) hours as needed for pain. Reported on 09/17/2015   Yes [provider]  latanoprost (XALATAN) 0.005 % ophthalmic solution 1 drop nightly.   Yes [provider]  lisinopril-hydrochlorothiazide (PRINZIDE,ZESTORETIC) 20-12.5 MG tablet Take 1 tablet by mouth daily. MUST SCHEDULE ANNUAL EXAM FOR FURTHER REFILLS 09/26/15  Yes Tonia Ghent, MD  Multiple Vitamins-Minerals (PRESERVISION AREDS 2) CAPS Take 1 capsule by mouth daily.   Yes [provider]  propranolol (INDERAL) 40 MG tablet Take 20 mg by mouth 2 (two) times daily.   Yes [provider]  rOPINIRole (REQUIP) 2 MG tablet TAKE 1 TABLET (2 MG TOTAL) BY MOUTH NIGHTLY. 06/26/17  Yes [provider]  timolol (TIMOPTIC) 0.5 %  ophthalmic solution INSTILL ONE DROP INTO BOTH EYES DAILY 05/21/17  Yes [provider]  valACYclovir (VALTREX) 500 MG tablet TAKE 1 TABLET DAILYFOR COLD SORES 1 TAB TWICE A DAY FOR 3 DAYS IF OUTBREAK THEN BACK TO 1 TAB DAILY 10/13/17  Yes [provider]  zolpidem (AMBIEN) 5 MG tablet Take 1 tablet (5 mg total) by mouth at bedtime as needed for sleep. 10/15/17  Yes Pucilowska, Jolanta B, MD  diclofenac sodium (VOLTAREN) 1 % GEL Apply topically. 03/09/17 03/09/18  [provider]  fluticasone (FLONASE) 50 MCG/ACT nasal spray Place 2 sprays into both nostrils at bedtime. Patient not taking: Reported  on 10/27/2017 10/15/17   Pucilowska, Wardell Honour, MD  loratadine (CLARITIN) 10 MG tablet Take 1 tablet (10 mg total) by mouth daily. Patient not taking: Reported on 10/27/2017 10/16/17   Clovis Fredrickson, MD    Family History  Problem Relation Age of Onset  . Heart disease Mother        h/o rheumatic fever  . Heart disease Father   . Stroke Father   . Diabetes Sister   . Diabetes Brother   . Diabetes Sister   . Colon cancer Neg Hx   . Breast cancer Neg Hx      Social History   Tobacco Use  . Smoking status: Former Smoker    Last attempt to quit: 09/17/1979    Years since quitting: 38.1  . Smokeless tobacco: Never Used  Substance Use Topics  . Alcohol use: No  . Drug use: No    Allergies as of 10/27/2017 - Review Complete 10/27/2017  Allergen Reaction Noted  . Codeine Hives and Nausea And Vomiting 03/06/2013    Review of Systems:    All systems reviewed and negative except where noted in HPI.   Physical Exam:  BP 124/71   Pulse (!) 54   Ht 5\' 3"  (1.6 m)   Wt 175 lb (79.4 kg)   BMI 31.00 kg/m  No LMP recorded. Patient has had a hysterectomy. Psych:  Alert and cooperative. Normal mood and affect. General:   Alert,  Well-developed, well-nourished, pleasant and cooperative in NAD Head:  Normocephalic and atraumatic. Eyes:  Sclera clear, no icterus.   Conjunctiva pink. Ears:  Normal auditory acuity. Nose:  No deformity, discharge, or lesions. Mouth:  No deformity or lesions,oropharynx pink & moist. Neck:  Supple; no masses or thyromegaly. Lungs:  Respirations even and unlabored.  Clear throughout to auscultation.   No wheezes, crackles, or rhonchi. No acute distress. Heart:  Regular rate and rhythm; no murmurs, clicks, rubs, or gallops. Abdomen:  Normal bowel sounds.  No bruits.  Soft, non-tender and non-distended without masses, hepatosplenomegaly or hernias noted.  No guarding or rebound tenderness.    Msk:  Symmetrical without gross deformities. Good, equal movement &  strength bilaterally. Pulses:  Normal pulses noted. Extremities:  No clubbing or edema.  No cyanosis. Neurologic:  Alert and oriented x3;  grossly normal neurologically. Skin:  Intact without significant lesions or rashes. No jaundice. Lymph Nodes:  No significant cervical adenopathy. Psych:  Alert and cooperative. Normal mood and affect.  Imaging Studies: Ct Head Wo Contrast  Result Date: 10/10/2017 CLINICAL DATA:  Hallucinations. EXAM: CT HEAD WITHOUT CONTRAST TECHNIQUE: Contiguous axial images were obtained from the base of the skull through the vertex without intravenous contrast. COMPARISON:  None. FINDINGS: Brain: No evidence of acute infarction, hemorrhage, hydrocephalus, extra-axial collection or mass lesion/mass effect. Mild brain parenchymal volume loss and deep white  matter microangiopathy. Vascular: Calcific atherosclerotic disease of the intra cavernous carotid arteries. Skull: Normal. Negative for fracture or focal lesion. Sinuses/Orbits: No acute finding. Other: None. IMPRESSION: No acute intracranial abnormality. Mild brain parenchymal atrophy. Chronic microvascular disease. Electronically Signed   By: Fidela Salisbury M.D.   On: 10/10/2017 09:14    Assessment and Plan:   Stacey Boyd is a 72 y.o. y/o female has been referred for diverticulosis of her colon which I explained is benign and at her age >50 % of the population would have the same. She does have a history of on and off diarrhea, possibly related to high consumption of salad and [place] dressing which contains a lot of high fructose corn syrup and can cause an osmotic diarrhea  Plan  1. Dietary change- stop all bottled dressings, make dressings from raw ingredients at home 2. Low FODMAP diet  3. Colonoscopy with biopsies to r/o microscopic colitis 4. Celiac serology    I have discussed alternative options, risks & benefits,  which include, but are not limited to, bleeding, infection, perforation,respiratory  complication & drug reaction.  The patient agrees with this plan & written consent will be obtained.    Follow up in 2-3 months   Dr Jonathon Bellows MD,MRCP(U.K)

## 2017-10-28 ENCOUNTER — Other Ambulatory Visit
Admission: RE | Admit: 2017-10-28 | Discharge: 2017-10-28 | Disposition: A | Discharge status: Home / Self Care | Payer: Medicare HMO | Source: Ambulatory Visit | Attending: Gastroenterology | Admitting: Gastroenterology

## 2017-10-28 DIAGNOSIS — R197 Diarrhea, unspecified: Secondary | ICD-10-CM | POA: Insufficient documentation

## 2017-10-28 LAB — GASTROINTESTINAL PANEL BY PCR, STOOL (REPLACES STOOL CULTURE)
Adenovirus F40/41: NOT DETECTED
Astrovirus: NOT DETECTED
CAMPYLOBACTER SPECIES: NOT DETECTED
CRYPTOSPORIDIUM: NOT DETECTED
Cyclospora cayetanensis: NOT DETECTED
Entamoeba histolytica: NOT DETECTED
Enteroaggregative E coli (EAEC): NOT DETECTED
Enteropathogenic E coli (EPEC): NOT DETECTED
Enterotoxigenic E coli (ETEC): NOT DETECTED
Giardia lamblia: NOT DETECTED
NOROVIRUS GI/GII: NOT DETECTED
PLESIMONAS SHIGELLOIDES: NOT DETECTED
ROTAVIRUS A: NOT DETECTED
SALMONELLA SPECIES: NOT DETECTED
SHIGA LIKE TOXIN PRODUCING E COLI (STEC): NOT DETECTED
SHIGELLA/ENTEROINVASIVE E COLI (EIEC): NOT DETECTED
Sapovirus (I, II, IV, and V): NOT DETECTED
Vibrio cholerae: NOT DETECTED
Vibrio species: NOT DETECTED
YERSINIA ENTEROCOLITICA: NOT DETECTED

## 2017-10-28 LAB — C DIFFICILE QUICK SCREEN W PCR REFLEX
C DIFFICILE (CDIFF) INTERP: NOT DETECTED
C Diff antigen: NEGATIVE
C Diff toxin: NEGATIVE

## 2017-10-30 ENCOUNTER — Encounter: Payer: Self-pay | Admitting: Gastroenterology

## 2017-11-07 ENCOUNTER — Ambulatory Visit: Payer: Medicare HMO | Admitting: Anesthesiology

## 2017-11-07 ENCOUNTER — Encounter: Payer: Self-pay | Admitting: Anesthesiology

## 2017-11-07 ENCOUNTER — Ambulatory Visit
Admission: RE | Admit: 2017-11-07 | Discharge: 2017-11-07 | Disposition: A | Discharge status: Home / Self Care | Payer: Medicare HMO | Source: Ambulatory Visit | Attending: Gastroenterology | Admitting: Gastroenterology

## 2017-11-07 ENCOUNTER — Encounter
Admission: RE | Disposition: A | Discharge status: Home / Self Care | Payer: Self-pay | Source: Ambulatory Visit | Attending: Gastroenterology

## 2017-11-07 DIAGNOSIS — D122 Benign neoplasm of ascending colon: Secondary | ICD-10-CM

## 2017-11-07 DIAGNOSIS — Z885 Allergy status to narcotic agent status: Secondary | ICD-10-CM | POA: Diagnosis not present

## 2017-11-07 DIAGNOSIS — M1990 Unspecified osteoarthritis, unspecified site: Secondary | ICD-10-CM | POA: Insufficient documentation

## 2017-11-07 DIAGNOSIS — E119 Type 2 diabetes mellitus without complications: Secondary | ICD-10-CM | POA: Diagnosis not present

## 2017-11-07 DIAGNOSIS — I10 Essential (primary) hypertension: Secondary | ICD-10-CM | POA: Diagnosis not present

## 2017-11-07 DIAGNOSIS — Z90710 Acquired absence of both cervix and uterus: Secondary | ICD-10-CM | POA: Diagnosis not present

## 2017-11-07 DIAGNOSIS — E785 Hyperlipidemia, unspecified: Secondary | ICD-10-CM | POA: Diagnosis not present

## 2017-11-07 DIAGNOSIS — R197 Diarrhea, unspecified: Secondary | ICD-10-CM | POA: Diagnosis present

## 2017-11-07 DIAGNOSIS — F419 Anxiety disorder, unspecified: Secondary | ICD-10-CM | POA: Diagnosis not present

## 2017-11-07 DIAGNOSIS — Z823 Family history of stroke: Secondary | ICD-10-CM | POA: Insufficient documentation

## 2017-11-07 DIAGNOSIS — H409 Unspecified glaucoma: Secondary | ICD-10-CM | POA: Diagnosis not present

## 2017-11-07 DIAGNOSIS — Z833 Family history of diabetes mellitus: Secondary | ICD-10-CM | POA: Insufficient documentation

## 2017-11-07 DIAGNOSIS — K5730 Diverticulosis of large intestine without perforation or abscess without bleeding: Secondary | ICD-10-CM | POA: Diagnosis not present

## 2017-11-07 DIAGNOSIS — Z79899 Other long term (current) drug therapy: Secondary | ICD-10-CM | POA: Diagnosis not present

## 2017-11-07 DIAGNOSIS — Z87891 Personal history of nicotine dependence: Secondary | ICD-10-CM | POA: Insufficient documentation

## 2017-11-07 DIAGNOSIS — Z8249 Family history of ischemic heart disease and other diseases of the circulatory system: Secondary | ICD-10-CM | POA: Insufficient documentation

## 2017-11-07 HISTORY — PX: COLONOSCOPY WITH PROPOFOL: SHX5780

## 2017-11-07 SURGERY — COLONOSCOPY WITH PROPOFOL
Anesthesia: General

## 2017-11-07 MED ORDER — MIDAZOLAM HCL 2 MG/2ML IJ SOLN
INTRAMUSCULAR | Status: AC
  Filled 2017-11-07: qty 2

## 2017-11-07 MED ORDER — PROPOFOL 500 MG/50ML IV EMUL
INTRAVENOUS | Status: DC | PRN
  Administered 2017-11-07: 100 ug/kg/min via INTRAVENOUS

## 2017-11-07 MED ORDER — SODIUM CHLORIDE 0.9 % IV SOLN: INTRAVENOUS | Status: DC

## 2017-11-07 MED ORDER — MIDAZOLAM HCL 2 MG/2ML IJ SOLN
INTRAMUSCULAR | Status: DC | PRN
  Administered 2017-11-07: 2 mg via INTRAVENOUS

## 2017-11-07 MED ORDER — SODIUM CHLORIDE 0.9 % IV SOLN
INTRAVENOUS | Status: DC
  Administered 2017-11-07: 1000 mL via INTRAVENOUS

## 2017-11-07 MED ORDER — FENTANYL CITRATE (PF) 100 MCG/2ML IJ SOLN
INTRAMUSCULAR | Status: AC
  Filled 2017-11-07: qty 2

## 2017-11-07 MED ORDER — FENTANYL CITRATE (PF) 100 MCG/2ML IJ SOLN
INTRAMUSCULAR | Status: DC | PRN
  Administered 2017-11-07: 50 ug via INTRAVENOUS

## 2017-11-07 MED ORDER — PROPOFOL 500 MG/50ML IV EMUL
INTRAVENOUS | Status: AC
  Filled 2017-11-07: qty 50

## 2017-11-07 NOTE — H&P (Signed)
Jonathon Bellows, MD [address] [place], [place], [place], [address] [address], [place], [place], [place], [address] Phone: [phone]  Fax: [phone]  Primary Care Physician:  Ronnell Freshwater, NP   Pre-Procedure History & Physical: HPI:  Stacey Boyd is a 72 y.o. female is here for an colonoscopy.   Past Medical History:  Diagnosis Date  . Allergy   . Arthritis   . Diabetes mellitus without complication ([place])    Pt states she is diet controlled.   . Glaucoma   . Hyperlipidemia   . Hypertension   . Wrist fracture 2001   right    Past Surgical History:  Procedure Laterality Date  . ABDOMINAL HYSTERECTOMY  2005  . APPENDECTOMY  1980  . CARDIAC CATHETERIZATION Left 09/17/2015   Procedure: Left Heart Cath and Coronary Angiography;  Surgeon: Teodoro Spray, MD;  Location: [facility];  Service: Cardiovascular;  Laterality: Left;  . FRACTURE SURGERY      Prior to Admission medications   Medication Sig Start Date End Date Taking? Authorizing Provider  atorvastatin (LIPITOR) 10 MG tablet TAKE 1 TABLET (10 MG TOTAL) BY MOUTH DAILY. 09/29/15  Yes Tonia Ghent, MD  azelastine (ASTELIN) 0.1 % nasal spray Place into the nose. 03/09/17 03/09/18 Yes [provider]  Cholecalciferol (VITAMIN D3) 5000 units TABS Take 5,000 Units by mouth daily.    Yes [provider]  haloperidol (HALDOL) 5 MG tablet Take 1 tablet (5 mg total) by mouth at bedtime. 10/15/17  Yes Pucilowska, Jolanta B, MD  ibuprofen (ADVIL,MOTRIN) 200 MG tablet Take 400 mg by mouth every 6 (six) hours as needed for pain. Reported on 09/17/2015   Yes [provider]  latanoprost (XALATAN) 0.005 % ophthalmic solution 1 drop nightly.   Yes [provider]  lisinopril-hydrochlorothiazide (PRINZIDE,ZESTORETIC) 20-12.5 MG tablet Take 1 tablet by mouth daily. MUST SCHEDULE ANNUAL EXAM FOR FURTHER REFILLS 09/26/15  Yes Tonia Ghent, MD  loratadine (CLARITIN) 10 MG  tablet Take 1 tablet (10 mg total) by mouth daily. 10/16/17  Yes Pucilowska, Jolanta B, MD  Multiple Vitamins-Minerals (PRESERVISION AREDS 2) CAPS Take 1 capsule by mouth daily.   Yes [provider]  propranolol (INDERAL) 40 MG tablet Take 20 mg by mouth 2 (two) times daily.   Yes [provider]  rOPINIRole (REQUIP) 2 MG tablet TAKE 1 TABLET (2 MG TOTAL) BY MOUTH NIGHTLY. 06/26/17  Yes [provider]  timolol (TIMOPTIC) 0.5 % ophthalmic solution INSTILL ONE DROP INTO BOTH EYES DAILY 05/21/17  Yes [provider]  valACYclovir (VALTREX) 500 MG tablet TAKE 1 TABLET DAILYFOR COLD SORES 1 TAB TWICE A DAY FOR 3 DAYS IF OUTBREAK THEN BACK TO 1 TAB DAILY 10/13/17  Yes [provider]  zolpidem (AMBIEN) 5 MG tablet Take 1 tablet (5 mg total) by mouth at bedtime as needed for sleep. 10/15/17  Yes Pucilowska, Jolanta B, MD  diclofenac sodium (VOLTAREN) 1 % GEL Apply topically. 03/09/17 03/09/18  [provider]  fluticasone (FLONASE) 50 MCG/ACT nasal spray Place 2 sprays into both nostrils at bedtime. Patient not taking: Reported on 10/27/2017 10/15/17   Clovis Fredrickson, MD    Allergies as of 10/28/2017 - Review Complete 10/27/2017  Allergen Reaction Noted  . Codeine Hives and Nausea And Vomiting 03/06/2013    Family History  Problem Relation Age of Onset  . Heart disease Mother        h/o rheumatic fever  .  Heart disease Father   . Stroke Father   . Diabetes Sister   . Diabetes Brother   . Diabetes Sister   . Colon cancer Neg Hx   . Breast cancer Neg Hx     Social History   Socioeconomic History  . Marital status: Divorced    Spouse name: Not on file  . Number of children: Not on file  . Years of education: Not on file  . Highest education level: Not on file  Occupational History  . Not on file  Social Needs  . Financial resource strain: Not on file  . Food insecurity:    Worry: Not on file    Inability: Not on file  .  Transportation needs:    Medical: Not on file    Non-medical: Not on file  Tobacco Use  . Smoking status: Former Smoker    Last attempt to quit: 09/17/1979    Years since quitting: 38.1  . Smokeless tobacco: Never Used  Substance and Sexual Activity  . Alcohol use: No  . Drug use: No  . Sexual activity: Yes  Lifestyle  . Physical activity:    Days per week: Not on file    Minutes per session: Not on file  . Stress: Not on file  Relationships  . Social connections:    Talks on phone: Not on file    Gets together: Not on file    Attends religious service: Not on file    Active member of club or organization: Not on file    Attends meetings of clubs or organizations: Not on file    Relationship status: Not on file  . Intimate partner violence:    Fear of current or ex partner: Not on file    Emotionally abused: Not on file    Physically abused: Not on file    Forced sexual activity: Not on file  Other Topics Concern  . Not on file  Social History Narrative   3 kids, local   Lives with daughter as of 2016   Divorced ~2002    Review of Systems: See HPI, otherwise negative ROS  Physical Exam: BP (!) 138/58   Pulse (!) 55   Temp (!) 96.8 F (36 C) (Tympanic)   Resp 18   Ht 5\' 3"  (1.6 m)   Wt 178 lb (80.7 kg)   SpO2 98%   BMI 31.53 kg/m  General:   Alert,  pleasant and cooperative in NAD Head:  Normocephalic and atraumatic. Neck:  Supple; no masses or thyromegaly. Lungs:  Clear throughout to auscultation, normal respiratory effort.    Heart:  +S1, +S2, Regular rate and rhythm, No edema. Abdomen:  Soft, nontender and nondistended. Normal bowel sounds, without guarding, and without rebound.   Neurologic:  Alert and  oriented x4;  grossly normal neurologically.  Impression/Plan: Stacey Boyd is here for an colonoscopy to be performed for diarrhea . Risks, benefits, limitations, and alternatives regarding  colonoscopy have been reviewed with the patient.  Questions  have been answered.  All parties agreeable.   Jonathon Bellows, MD  11/07/2017, 9:06 AM

## 2017-11-07 NOTE — Anesthesia Post-op Follow-up Note (Signed)
Anesthesia QCDR form completed.        

## 2017-11-07 NOTE — Anesthesia Procedure Notes (Signed)
Performed by: Cook-Martin, Eufemio Strahm Pre-anesthesia Checklist: Patient identified, Emergency Drugs available, Suction available, Patient being monitored and Timeout performed Patient Re-evaluated:Patient Re-evaluated prior to induction Oxygen Delivery Method: Nasal cannula Preoxygenation: Pre-oxygenation with 100% oxygen Induction Type: IV induction Placement Confirmation: positive ETCO2 and CO2 detector       

## 2017-11-07 NOTE — Anesthesia Postprocedure Evaluation (Signed)
Anesthesia Post Note  Patient: Stacey Boyd  Procedure(s) Performed: COLONOSCOPY WITH PROPOFOL (N/A )  Patient location during evaluation: Endoscopy Anesthesia Type: General Level of consciousness: awake and alert Pain management: pain level controlled Vital Signs Assessment: post-procedure vital signs reviewed and stable Respiratory status: spontaneous breathing, nonlabored ventilation, respiratory function stable and patient connected to nasal cannula oxygen Cardiovascular status: blood pressure returned to baseline and stable Postop Assessment: no apparent nausea or vomiting Anesthetic complications: no     Last Vitals:  Vitals:   11/07/17 1013 11/07/17 1023  BP: 140/61 (!) 150/63  Pulse: (!) 49 (!) 54  Resp: 16 16  Temp:    SpO2: 99% 100%    Last Pain:  Vitals:   11/07/17 1023  TempSrc:   PainSc: 0-No pain                 Aeson Sawyers S

## 2017-11-07 NOTE — Transfer of Care (Signed)
   Immediate Anesthesia Transfer of Care Note  Patient: Stacey Boyd  Procedure(s) Performed: COLONOSCOPY WITH PROPOFOL (N/A )  Patient Location: PACU  Anesthesia Type:General  Level of Consciousness: awake and sedated  Airway & Oxygen Therapy: Patient Spontanous Breathing and Patient connected to nasal cannula oxygen  Post-op Assessment: Report given to RN and Post -op Vital signs reviewed and stable  Post vital signs: Reviewed and stable  Last Vitals:  Vitals Value Taken Time  BP    Temp    Pulse    Resp    SpO2      Last Pain:  Vitals:   11/07/17 0827  TempSrc: Tympanic  PainSc: 0-No pain         Complications: No apparent anesthesia complications

## 2017-11-07 NOTE — Op Note (Signed)
[facility] Gastroenterology Patient Name: Stacey Boyd Procedure Date: 11/07/2017 9:08 AM MRN: [id number] Account #: [id number] Date of Birth: [birth year] Admit Type: Outpatient Age: 72 Room: [facility] ENDO ROOM 4 Gender: Female Note Status: Finalized Procedure:            Colonoscopy Indications:          Diarrhea Providers:            Jonathon Bellows MD, MD Referring MD:         Lavera Guise, MD (Referring MD) Medicines:            Monitored Anesthesia Care Complications:        No immediate complications. Procedure:            Pre-Anesthesia Assessment:                       - Prior to the procedure, a History and Physical was                        performed, and patient medications, allergies and                        sensitivities were reviewed. The patient's tolerance of                        previous anesthesia was reviewed.                       - The risks and benefits of the procedure and the                        sedation options and risks were discussed with the                        patient. All questions were answered and informed                        consent was obtained.                       - ASA Grade Assessment: II - A patient with mild                        systemic disease.                       After obtaining informed consent, the colonoscope was                        passed under direct vision. Throughout the procedure,                        the patient's blood pressure, pulse, and oxygen                        saturations were monitored continuously. The                        Colonoscope was introduced through the anus and                        advanced to  the the cecum, identified by the                        appendiceal orifice, IC valve and transillumination.                        The colonoscopy was somewhat difficult due to                        significant looping. Successful completion of the                        procedure  was aided by withdrawing the scope and                        replacing with the pediatric colonoscope. The patient                        tolerated the procedure well. The quality of the bowel                        preparation was good. Findings:      The perianal and digital rectal examinations were normal.      Multiple small-mouthed diverticula were found in the left colon.      Five sessile polyps were found in the ascending colon. The polyps were 5       to 7 mm in size. These polyps were removed with a cold snare. Resection       and retrieval were complete.      Normal mucosa was found in the entire colon. Biopsies for histology were       taken with a cold forceps from the entire colon for evaluation of       microscopic colitis.      The exam was otherwise without abnormality on direct and retroflexion       views. Impression:           - Diverticulosis in the left colon.                       - Five 5 to 7 mm polyps in the ascending colon, removed                        with a cold snare. Resected and retrieved.                       - Normal mucosa in the entire examined colon. Biopsied.                       - The examination was otherwise normal on direct and                        retroflexion views. Recommendation:       - Discharge patient to home (with escort).                       - Resume previous diet.                       - Continue present medications.                       -  Await pathology results.                       - Repeat colonoscopy in 3 years for surveillance.                       - Return to GI office as previously scheduled. Procedure Code(s):    --- Professional ---                       [phone], Colonoscopy, flexible; with removal of tumor(s),                        polyp(s), or other lesion(s) by snare technique                       [id number], 60, Colonoscopy, flexible; with biopsy, single                        or multiple Diagnosis Code(s):     --- Professional ---                       D12.2, Benign neoplasm of ascending colon                       R19.7, Diarrhea, unspecified                       K57.30, Diverticulosis of large intestine without                        perforation or abscess without bleeding CPT copyright 2017 American Medical Association. All rights reserved. The codes documented in this report are preliminary and upon coder review may  be revised to meet current compliance requirements. Jonathon Bellows, MD Jonathon Bellows MD, MD 11/07/2017 9:51:29 AM This report has been signed electronically. Number of Addenda: 0 Note Initiated On: 11/07/2017 9:08 AM Scope Withdrawal Time: 0 hours 14 minutes 45 seconds  Total Procedure Duration: 0 hours 26 minutes 6 seconds       [facility]

## 2017-11-07 NOTE — Anesthesia Preprocedure Evaluation (Addendum)
Anesthesia Evaluation  Patient identified by MRN, date of birth, ID band Patient awake    Reviewed: Allergy & Precautions, NPO status , Patient's Chart, lab work & pertinent test results, reviewed documented beta blocker date and time   Airway Mallampati: III  TM Distance: >3 FB     Dental  (+) Chipped   Pulmonary former smoker,           Cardiovascular hypertension, Pt. on medications      Neuro/Psych Anxiety    GI/Hepatic   Endo/Other  diabetes, Type 2  Renal/GU Renal disease     Musculoskeletal  (+) Arthritis ,   Abdominal   Peds  Hematology   Anesthesia Other Findings Stress test and cath ok last year.  Reproductive/Obstetrics                            Anesthesia Physical Anesthesia Plan  ASA: II  Anesthesia Plan: General   Post-op Pain Management:    Induction: Intravenous  PONV Risk Score and Plan:   Airway Management Planned:   Additional Equipment:   Intra-op Plan:   Post-operative Plan:   Informed Consent: I have reviewed the patients History and Physical, chart, labs and discussed the procedure including the risks, benefits and alternatives for the proposed anesthesia with the patient or authorized representative who has indicated his/her understanding and acceptance.     Plan Discussed with: CRNA  Anesthesia Plan Comments:         Anesthesia Quick Evaluation

## 2017-11-08 LAB — SURGICAL PATHOLOGY

## 2017-11-10 ENCOUNTER — Ambulatory Visit (INDEPENDENT_AMBULATORY_CARE_PROVIDER_SITE_OTHER): Payer: Medicare HMO | Admitting: Nurse Practitioner

## 2017-11-10 ENCOUNTER — Encounter: Payer: Self-pay | Admitting: Nurse Practitioner

## 2017-11-10 VITALS — BP 152/68 | HR 60 | Resp 16 | Ht 63.0 in | Wt 173.8 lb

## 2017-11-10 DIAGNOSIS — F5101 Primary insomnia: Secondary | ICD-10-CM

## 2017-11-10 DIAGNOSIS — R440 Auditory hallucinations: Secondary | ICD-10-CM

## 2017-11-10 DIAGNOSIS — G629 Polyneuropathy, unspecified: Secondary | ICD-10-CM

## 2017-11-10 DIAGNOSIS — R5383 Other fatigue: Secondary | ICD-10-CM | POA: Diagnosis not present

## 2017-11-10 MED ORDER — ZOLPIDEM TARTRATE 5 MG PO TABS: 5.0000 mg | ORAL_TABLET | Freq: Every evening | ORAL | 3 refills | Status: DC | PRN

## 2017-11-10 MED ORDER — HALOPERIDOL 5 MG PO TABS: 5.0000 mg | ORAL_TABLET | Freq: Every evening | ORAL | 3 refills | Status: DC | PRN

## 2017-11-10 NOTE — Progress Notes (Signed)
[facility] [place], [place] [address]  Internal MEDICINE  Office Visit Note  Patient Name: Stacey Boyd  [id number]  [id number]  Date of Service: 12/04/2017  Pt is here for recent hospital follow up.   Chief Complaint  Patient presents with  . Back Pain    6wk follow up     The patient was recently hospitalized or psychiatric issues. When first seen, the patient was concerned about difficulty sleeping and hearing voices and loud music. She was in the hospital for a few weeks in [place] and discharged on current medications. She states that she currently does not have a psychiatric provider or counselor to see    Current Medication: Outpatient Encounter Medications as of 11/10/2017  Medication Sig  . atorvastatin (LIPITOR) 10 MG tablet TAKE 1 TABLET (10 MG TOTAL) BY MOUTH DAILY.  Marland Kitchen azelastine (ASTELIN) 0.1 % nasal spray Place into the nose.  . Cholecalciferol (VITAMIN D3) 5000 units TABS Take 5,000 Units by mouth daily.   . diclofenac sodium (VOLTAREN) 1 % GEL Apply topically.  . haloperidol (HALDOL) 5 MG tablet Take 1 tablet (5 mg total) by mouth at bedtime as needed for agitation.  Marland Kitchen ibuprofen (ADVIL,MOTRIN) 200 MG tablet Take 400 mg by mouth every 6 (six) hours as needed for pain. Reported on 09/17/2015  . latanoprost (XALATAN) 0.005 % ophthalmic solution 1 drop nightly.  . loratadine (CLARITIN) 10 MG tablet Take 1 tablet (10 mg total) by mouth daily.  . Multiple Vitamins-Minerals (PRESERVISION AREDS 2) CAPS Take 1 capsule by mouth daily.  . propranolol (INDERAL) 40 MG tablet Take 20 mg by mouth 2 (two) times daily.  Marland Kitchen rOPINIRole (REQUIP) 2 MG tablet TAKE 1 TABLET (2 MG TOTAL) BY MOUTH NIGHTLY.  . timolol (TIMOPTIC) 0.5 % ophthalmic solution INSTILL ONE DROP INTO BOTH EYES DAILY  . valACYclovir (VALTREX) 500 MG tablet TAKE 1 TABLET DAILYFOR COLD SORES 1 TAB TWICE A DAY FOR 3 DAYS IF OUTBREAK THEN BACK TO 1 TAB DAILY  . [DISCONTINUED] haloperidol  (HALDOL) 5 MG tablet Take 1 tablet (5 mg total) by mouth at bedtime.  . [DISCONTINUED] lisinopril-hydrochlorothiazide (PRINZIDE,ZESTORETIC) 20-12.5 MG tablet Take 1 tablet by mouth daily. MUST SCHEDULE ANNUAL EXAM FOR FURTHER REFILLS  . [DISCONTINUED] zolpidem (AMBIEN) 5 MG tablet Take 1 tablet (5 mg total) by mouth at bedtime as needed for sleep.  . [DISCONTINUED] zolpidem (AMBIEN) 5 MG tablet Take 1 tablet (5 mg total) by mouth at bedtime as needed for sleep.  . fluticasone (FLONASE) 50 MCG/ACT nasal spray Place 2 sprays into both nostrils at bedtime. (Patient not taking: Reported on 10/27/2017)   No facility-administered encounter medications on file as of 11/10/2017.     Surgical History: Past Surgical History:  Procedure Laterality Date  . ABDOMINAL HYSTERECTOMY  2005  . APPENDECTOMY  1980  . CARDIAC CATHETERIZATION Left 09/17/2015   Procedure: Left Heart Cath and Coronary Angiography;  Surgeon: Teodoro Spray, MD;  Location: [place] [facility];  Service: Cardiovascular;  Laterality: Left;  . COLONOSCOPY WITH PROPOFOL N/A 11/07/2017   Procedure: COLONOSCOPY WITH PROPOFOL;  Surgeon: Jonathon Bellows, MD;  Location: [facility] ENDOSCOPY;  Service: Gastroenterology;  Laterality: N/A;  . FRACTURE SURGERY      Medical History: Past Medical History:  Diagnosis Date  . Allergy   . Arthritis   . Diabetes mellitus without complication ([place])    Pt states she is diet controlled.   . Glaucoma   . Hyperlipidemia   .  Hypertension   . Wrist fracture 2001   right    Family History: Family History  Problem Relation Age of Onset  . Heart disease Mother        h/o rheumatic fever  . Heart disease Father   . Stroke Father   . Diabetes Sister   . Diabetes Brother   . Diabetes Sister   . Colon cancer Neg Hx   . Breast cancer Neg Hx     Social History   Socioeconomic History  . Marital status: Divorced    Spouse name: Not on file  . Number of children: Not on file  . Years of education: Not on  file  . Highest education level: Not on file  Occupational History  . Not on file  Social Needs  . Financial resource strain: Not on file  . Food insecurity:    Worry: Not on file    Inability: Not on file  . Transportation needs:    Medical: Not on file    Non-medical: Not on file  Tobacco Use  . Smoking status: Former Smoker    Last attempt to quit: 09/17/1979    Years since quitting: 38.2  . Smokeless tobacco: Never Used  Substance and Sexual Activity  . Alcohol use: No  . Drug use: No  . Sexual activity: Yes  Lifestyle  . Physical activity:    Days per week: Not on file    Minutes per session: Not on file  . Stress: Not on file  Relationships  . Social connections:    Talks on phone: Not on file    Gets together: Not on file    Attends religious service: Not on file    Active member of club or organization: Not on file    Attends meetings of clubs or organizations: Not on file    Relationship status: Not on file  . Intimate partner violence:    Fear of current or ex partner: Not on file    Emotionally abused: Not on file    Physically abused: Not on file    Forced sexual activity: Not on file  Other Topics Concern  . Not on file  Social History Narrative   3 kids, local   Lives with daughter as of 2016   Divorced ~2002      Review of Systems  Constitutional: Positive for activity change and fatigue. Negative for chills and unexpected weight change.  HENT: Negative for congestion, postnasal drip, rhinorrhea, sneezing, sore throat and voice change.   Eyes: Negative.  Negative for redness.  Respiratory: Negative for cough, chest tightness, shortness of breath and wheezing.   Cardiovascular: Negative for chest pain and palpitations.  Gastrointestinal: Negative for abdominal pain, constipation, diarrhea, nausea and vomiting.  Endocrine: Negative for cold intolerance, heat intolerance, polydipsia, polyphagia and polyuria.  Genitourinary: Negative for dysuria and  frequency.  Musculoskeletal: Positive for back pain. Negative for arthralgias, joint swelling, myalgias and neck pain.  Skin: Negative for rash.  Neurological: Positive for weakness and headaches. Negative for tremors and numbness.       Believes she is having some issues with her memory.   Hematological: Negative for adenopathy. Does not bruise/bleed easily.  Psychiatric/Behavioral: Positive for confusion, hallucinations and sleep disturbance. Negative for behavioral problems (Depression) and suicidal ideas. The patient is not nervous/anxious.        Recent hospitalization for psychiatric issues.    Today's Vitals   11/10/17 1540  BP: (!) 152/68  Pulse: 60  Resp: 16  SpO2: 95%  Weight: 173 lb 12.8 oz (78.8 kg)  Height: 5\' 3"  (1.6 m)   Physical Exam  Constitutional: She is oriented to person, place, and time. She appears well-developed and well-nourished. No distress.  HENT:  Head: Normocephalic and atraumatic.  Nose: Nose normal.  Mouth/Throat: Oropharynx is clear and moist. No oropharyngeal exudate.  Eyes: Pupils are equal, round, and reactive to light. Conjunctivae and EOM are normal.  Neck: Normal range of motion. Neck supple. No JVD present. Carotid bruit is not present. No tracheal deviation present. No thyromegaly present.  Cardiovascular: Normal rate, regular rhythm and normal heart sounds. Exam reveals no gallop and no friction rub.  No murmur heard. Pulmonary/Chest: Effort normal and breath sounds normal. No respiratory distress. She has no wheezes. She has no rales. She exhibits no tenderness.  Abdominal: Soft. Bowel sounds are normal. There is no tenderness.  Musculoskeletal:  There is moderate to severe pain in the lower back and the tailbone. The patient has grimacing present with changes in position. Unable to find a comfortable seated position. No bruising, swelling, or palpable abnormalities are noted today.   Lymphadenopathy:    She has no cervical adenopathy.   Neurological: She is alert and oriented to person, place, and time. No cranial nerve deficit.  Skin: Skin is warm and dry. She is not diaphoretic.  Psychiatric: Her speech is normal and behavior is normal. Her mood appears anxious. Thought content is paranoid and delusional. Cognition and memory are impaired. She expresses impulsivity. She exhibits a depressed mood. She expresses no homicidal and no suicidal ideation.  Nursing note and vitals reviewed.  Assessment/Plan: 1. Peripheral polyneuropathy requip 2mg  at bedtime to help with restless legs and neuropathy in both feet and legs.   2. Other fatigue Reviewed all labs and progress notes from recent hospitalization. Check labs and treat as indicated. Will monitor closely.  3. Auditory hallucination Continue medications as prescribed from recent hospitalization. Refer to psychiatry for continued evaluation nd treatment.  - haloperidol (HALDOL) 5 MG tablet; Take 1 tablet (5 mg total) by mouth at bedtime as needed for agitation.  Dispense: 30 tablet; Refill: 3 - Ambulatory referral to Psychiatry  4. Primary insomnia May use zolpidem as needed and as prescribed. Refer to psychiatry for further evaluation and treatment.   General Counseling: meganne rita understanding of the findings of todays visit and agrees with plan of treatment. I have discussed any further diagnostic evaluation that may be needed or ordered today. We also reviewed her medications today. she has been encouraged to call the office with any questions or concerns that should arise related to todays visit.    Counseling:  This patient was seen by Leretha Pol FNP Collaboration with Dr Lavera Guise as a part of collaborative care agreement  Orders Placed This Encounter  Procedures  . Ambulatory referral to Psychiatry      I have reviewed all medical records from hospital follow up including radiology reports and consults from other physicians. Appropriate  follow up diagnostics will be scheduled as needed. Patient/ Family understands the plan of treatment. Time spent 25 minutes.   Dr Lavera Guise, MD Internal Medicine

## 2017-11-11 ENCOUNTER — Encounter: Payer: Self-pay | Admitting: Gastroenterology

## 2017-11-14 ENCOUNTER — Other Ambulatory Visit: Payer: Self-pay | Admitting: Nurse Practitioner

## 2017-11-14 ENCOUNTER — Telehealth: Payer: Self-pay | Admitting: Nurse Practitioner

## 2017-11-14 DIAGNOSIS — F5101 Primary insomnia: Secondary | ICD-10-CM

## 2017-11-14 MED ORDER — ZOLPIDEM TARTRATE 5 MG PO TABS: 5.0000 mg | ORAL_TABLET | Freq: Every evening | ORAL | 3 refills | Status: DC | PRN

## 2017-11-14 NOTE — Telephone Encounter (Signed)
I did send this at visit, but resent it today since it didn't go through. Have them check pharmacy in little while. thanks

## 2017-11-14 NOTE — Progress Notes (Signed)
Resent rx for zolpidem 5mg  QHS as needed. Went to [facility]

## 2017-11-14 NOTE — Telephone Encounter (Signed)
Friend of patient walked in clinic this morning that came with her to her appointment last week and said that medication refill was not filled at pharmacy. Medication is Zolpidem Tartrate 5 MG. Can you send this to her pharmacy? Thank you.

## 2017-11-15 ENCOUNTER — Telehealth: Payer: Self-pay | Admitting: Nurse Practitioner

## 2017-11-15 LAB — IRON AND TIBC
IRON: 95 ug/dL (ref 27–139)
Iron Saturation: 31 % (ref 15–55)
TIBC: 303 ug/dL (ref 250–450)
UIBC: 208 ug/dL (ref 118–369)

## 2017-11-15 LAB — BASIC METABOLIC PANEL
BUN/Creatinine Ratio: 21 (ref 12–28)
BUN: 22 mg/dL (ref 8–27)
CO2: 27 mmol/L (ref 20–29)
CREATININE: 1.03 mg/dL — AB (ref 0.57–1.00)
Calcium: 9.4 mg/dL (ref 8.7–10.3)
Chloride: 100 mmol/L (ref 96–106)
GFR calc Af Amer: 62 mL/min/{1.73_m2} (ref >59)
GFR, EST NON AFRICAN AMERICAN: 54 mL/min/{1.73_m2} — AB (ref >59)
GLUCOSE: 182 mg/dL — AB (ref 65–99)
Potassium: 4.5 mmol/L (ref 3.5–5.2)
Sodium: 141 mmol/L (ref 134–144)

## 2017-11-15 LAB — CBC
HEMATOCRIT: 39.6 % (ref 34.0–46.6)
HEMOGLOBIN: 13.3 g/dL (ref 11.1–15.9)
MCH: 30.6 pg (ref 26.6–33.0)
MCHC: 33.6 g/dL (ref 31.5–35.7)
MCV: 91 fL (ref 79–97)
Platelets: 292 10*3/uL (ref 150–450)
RBC: 4.34 x10E6/uL (ref 3.77–5.28)
RDW: 13.5 % (ref 12.3–15.4)
WBC: 6.6 10*3/uL (ref 3.4–10.8)

## 2017-11-15 LAB — T4, FREE: Free T4: 1.24 ng/dL (ref 0.82–1.77)

## 2017-11-15 LAB — B12 AND FOLATE PANEL: VITAMIN B 12: 618 pg/mL (ref 232–1245)

## 2017-11-15 LAB — FERRITIN: FERRITIN: 116 ng/mL (ref 15–150)

## 2017-11-15 LAB — TSH: TSH: 5.61 u[IU]/mL — ABNORMAL HIGH (ref 0.450–4.500)

## 2017-11-15 NOTE — Telephone Encounter (Signed)
Prior approval for zolpidem tartrate 5 mg approved .  Authorization is good until 11/12/2019

## 2017-11-16 ENCOUNTER — Encounter: Payer: Self-pay | Admitting: Gastroenterology

## 2017-11-21 ENCOUNTER — Other Ambulatory Visit: Payer: Self-pay | Admitting: Nurse Practitioner

## 2017-11-21 ENCOUNTER — Telehealth: Payer: Self-pay

## 2017-11-21 ENCOUNTER — Other Ambulatory Visit: Payer: Self-pay

## 2017-11-21 DIAGNOSIS — E039 Hypothyroidism, unspecified: Secondary | ICD-10-CM

## 2017-11-21 MED ORDER — LISINOPRIL-HYDROCHLOROTHIAZIDE 20-12.5 MG PO TABS: 1.0000 | ORAL_TABLET | Freq: Every day | ORAL | 0 refills | Status: DC

## 2017-11-21 MED ORDER — LEVOTHYROXINE SODIUM 25 MCG PO TABS: 25.0000 ug | ORAL_TABLET | Freq: Every day | ORAL | 3 refills | Status: DC

## 2017-11-21 NOTE — Telephone Encounter (Signed)
Pt advised for her labs see other reminder

## 2017-11-21 NOTE — Telephone Encounter (Signed)
-----   Message from Ronnell Freshwater, NP sent at 11/21/2017 11:47 AM EDT ----- Please let the patient know that she is Slightly hypothyroid on labs. Added levothyroxine 70mcg daily on empty stomach. Recheck thyroid panel I 2 months. Blood sugar elevated. Last HgbA1c 10/12/2017 was 6.3. Will continue to monitor closely. Check at next visit. Other labs appear to be improving. thanks

## 2017-11-21 NOTE — Progress Notes (Signed)
Slightly hypothyroid on labs. Added levothyroxine 70mcg daily on empty stomach. Recheck thyroid panel I 2 months. Blood sugar elevated. Last HgbA1c 10/12/2017 was 6.3. Will continue to monitor closely. Check at next visit. Other labs appear to be improving.

## 2017-12-04 DIAGNOSIS — R5383 Other fatigue: Secondary | ICD-10-CM | POA: Insufficient documentation

## 2017-12-04 DIAGNOSIS — R440 Auditory hallucinations: Secondary | ICD-10-CM | POA: Insufficient documentation

## 2017-12-04 DIAGNOSIS — F5101 Primary insomnia: Secondary | ICD-10-CM | POA: Insufficient documentation

## 2017-12-04 DIAGNOSIS — G629 Polyneuropathy, unspecified: Secondary | ICD-10-CM | POA: Insufficient documentation

## 2017-12-15 ENCOUNTER — Other Ambulatory Visit: Payer: Self-pay

## 2017-12-15 MED ORDER — PROPRANOLOL HCL 40 MG PO TABS
20.0000 mg | ORAL_TABLET | Freq: Two times a day (BID) | ORAL | 1 refills | Status: DC
Start: 2017-12-15 — End: 2018-12-22

## 2017-12-26 ENCOUNTER — Other Ambulatory Visit: Payer: Self-pay

## 2017-12-26 MED ORDER — ROPINIROLE HCL 2 MG PO TABS: ORAL_TABLET | ORAL | 1 refills | Status: DC

## 2017-12-29 ENCOUNTER — Encounter: Payer: Self-pay | Admitting: Psychiatry

## 2017-12-29 ENCOUNTER — Encounter (INDEPENDENT_AMBULATORY_CARE_PROVIDER_SITE_OTHER): Payer: Self-pay

## 2017-12-29 ENCOUNTER — Other Ambulatory Visit: Payer: Self-pay

## 2017-12-29 ENCOUNTER — Ambulatory Visit (INDEPENDENT_AMBULATORY_CARE_PROVIDER_SITE_OTHER): Payer: Medicare HMO | Admitting: Psychiatry

## 2017-12-29 VITALS — BP 124/78 | HR 72 | Temp 98.3°F | Wt 173.2 lb

## 2017-12-29 DIAGNOSIS — E039 Hypothyroidism, unspecified: Secondary | ICD-10-CM | POA: Insufficient documentation

## 2017-12-29 DIAGNOSIS — F22 Delusional disorders: Secondary | ICD-10-CM

## 2017-12-29 NOTE — Progress Notes (Signed)
Psychiatric Initial Adult Assessment   Patient Identification: Stacey Boyd MRN:  [id number] Date of Evaluation:  12/29/2017 Referral Source: Dr. Bary Leriche Chief Complaint:   ' I am here to establish care." Chief Complaint    Establish Care; Depression; Manic Behavior; Other     Visit Diagnosis:    ICD-10-CM   1. Delusional disorder ([place]) F22     History of Present Illness:  Stacey Boyd is a 72 year old Caucasian female, divorced, lives in [place] with a roommate, retired, presented to the clinic today to establish care.  Patient was recently discharged from [facility] inpatient behavioral health unit on 10/15/2017 with a diagnosis of delusional disorder.  Based on review of [place] R notes patient was admitted to the hospital 10/10/2017.  Patient at that time was hearing noises and screams from next door apartments.  They persisted in spite of changing her address 3 times in the past.  She was also having some visual hallucinations of seeing people coming into her house and she was afraid that they were trying to kill her.  She was having sleep problems due to her hallucinations and delusions.  Patient was admitted to the behavioral health unit from 5/28 to  10/15/2017.  She responded to Haldol and Ambien.  She was advised to follow-up in this clinic on discharge from the [facility].  Patient today reports that she has had a good response to the medications Haldol and Ambien.  She reports she sleeps well at night and hence does not hear any noises anymore.  She also denies any visual hallucinations or delusions at this time.  She was able to answer all questions appropriately.  She was here with her roommate Preston Fleeting who provided collateral information.  Patient denies any mood symptoms.  She denies being anxious or depressed.  She reports her appetite is fair.  She denies any suicidality.  Patient denies any homicidality.  Patient denies any history of trauma in the past.  Patient denies any manic or hypomanic  symptoms in the past.  Patient denies ever being hospitalized prior to this recent inpatient mental health admission for psychiatric reasons.  Patient denies any suicide attempts in the past.  Patient also denies using any illicit substances or alcohol .  Associated Signs/Symptoms: Depression Symptoms:  denies (Hypo) Manic Symptoms:  Delusions, Anxiety Symptoms:  denies Psychotic Symptoms:  Delusions, Hallucinations: Auditory PTSD Symptoms: Negative  Past Psychiatric History: Patient was admitted to the inpatient mental health unit at [facility] 10/10/2017 to 10/15/2017.  Patient was diagnosed with delusional disorder.  Denies any previous admission prior to this one.  Patient denies any suicide attempts in the past.  Previous Psychotropic Medications: Yes Haldol, Ambien.  Substance Abuse History in the last 12 months:  No.  Consequences of Substance Abuse: Negative  Past Medical History:  Past Medical History:  Diagnosis Date  . Allergy   . Arthritis   . Cancer ([place])    skin cancer on right shoulder.   . Diabetes mellitus without complication ([place])    Pt states she is diet controlled.   . Glaucoma   . Hyperlipidemia   . Hypertension   . Thyroid disease   . Wrist fracture 2001   right    Past Surgical History:  Procedure Laterality Date  . ABDOMINAL HYSTERECTOMY  2005  . APPENDECTOMY  1980  . CARDIAC CATHETERIZATION Left 09/17/2015   Procedure: Left Heart Cath and Coronary Angiography;  Surgeon: Teodoro Spray, MD;  Location: [place] [facility];  Service: Cardiovascular;  Laterality: Left;  . COLONOSCOPY WITH PROPOFOL N/A 11/07/2017   Procedure: COLONOSCOPY WITH PROPOFOL;  Surgeon: Jonathon Bellows, MD;  Location: [facility] ENDOSCOPY;  Service: Gastroenterology;  Laterality: N/A;  . FRACTURE SURGERY      Family Psychiatric History: Patient reports a niece with history of depression.  Family History:  Family History  Problem Relation Age of Onset  . Heart disease Mother         h/o rheumatic fever  . Heart disease Father   . Stroke Father   . Diabetes Sister   . Diabetes Brother   . Diabetes Sister   . Mental illness Other   . Colon cancer Neg Hx   . Breast cancer Neg Hx     Social History:   Social History   Socioeconomic History  . Marital status: Divorced    Spouse name: Not on file  . Number of children: 3  . Years of education: Not on file  . Highest education level: 10th grade  Occupational History  . Not on file  Social Needs  . Financial resource strain: Not hard at all  . Food insecurity:    Worry: Never true    Inability: Never true  . Transportation needs:    Medical: No    Non-medical: No  Tobacco Use  . Smoking status: Former Smoker    Last attempt to quit: 09/17/1979    Years since quitting: 38.3  . Smokeless tobacco: Never Used  Substance and Sexual Activity  . Alcohol use: No  . Drug use: No  . Sexual activity: Yes  Lifestyle  . Physical activity:    Days per week: 0 days    Minutes per session: 0 min  . Stress: Not at all  Relationships  . Social connections:    Talks on phone: Not on file    Gets together: Not on file    Attends religious service: 1 to 4 times per year    Active member of club or organization: No    Attends meetings of clubs or organizations: Never    Relationship status: Divorced  Other Topics Concern  . Not on file  Social History Narrative   3 kids, local   Lives with daughter as of 2016   Divorced ~2002    Additional Social History: Reports she had a very good childhood.  She has 2 siblings.  She reports she is close to them.  She was married x2.  She is divorced.  She has 3 children ,one son and 2 daughters-adults and she reports good support system from them.  She currently lives in [place] with her roommate Elberta Fortis who is here with her today for the appointment.  Pt is retired.  Allergies:   Allergies  Allergen Reactions  . Codeine Hives and Nausea And Vomiting    Metabolic  Disorder Labs: Lab Results  Component Value Date   HGBA1C 6.3 (H) 10/10/2017   MPG 134.11 10/10/2017   No results found for: PROLACTIN Lab Results  Component Value Date   CHOL 188 10/10/2017   TRIG 107 10/10/2017   HDL 43 10/10/2017   CHOLHDL 4.4 10/10/2017   VLDL 21 10/10/2017   LDLCALC 124 (H) 10/10/2017   LDLCALC 105 (H) 08/22/2017     Current Medications: Current Outpatient Medications  Medication Sig Dispense Refill  . atorvastatin (LIPITOR) 10 MG tablet TAKE 1 TABLET (10 MG TOTAL) BY MOUTH DAILY. 90 tablet 0  . azelastine (ASTELIN) 0.1 % nasal spray  Place into the nose.    . Cholecalciferol (VITAMIN D3) 5000 units TABS Take 5,000 Units by mouth daily.     . diclofenac sodium (VOLTAREN) 1 % GEL Apply topically.    . fluticasone (FLONASE) 50 MCG/ACT nasal spray Place 2 sprays into both nostrils at bedtime. 16 g 2  . haloperidol (HALDOL) 5 MG tablet Take 1 tablet (5 mg total) by mouth at bedtime as needed for agitation. 30 tablet 3  . ibuprofen (ADVIL,MOTRIN) 200 MG tablet Take 400 mg by mouth every 6 (six) hours as needed for pain. Reported on 09/17/2015    . latanoprost (XALATAN) 0.005 % ophthalmic solution 1 drop nightly.    . levothyroxine (SYNTHROID, LEVOTHROID) 25 MCG tablet Take 1 tablet (25 mcg total) by mouth daily before breakfast. 30 tablet 3  . lisinopril-hydrochlorothiazide (PRINZIDE,ZESTORETIC) 20-12.5 MG tablet Take 1 tablet by mouth daily. MUST SCHEDULE ANNUAL EXAM FOR FURTHER REFILLS 90 tablet 0  . loratadine (CLARITIN) 10 MG tablet Take 1 tablet (10 mg total) by mouth daily. 30 tablet 1  . Multiple Vitamins-Minerals (PRESERVISION AREDS 2) CAPS Take 1 capsule by mouth daily.    . propranolol (INDERAL) 40 MG tablet Take 0.5 tablets (20 mg total) by mouth 2 (two) times daily. 60 tablet 1  . rOPINIRole (REQUIP) 2 MG tablet TAKE 1 TABLET (2 MG TOTAL) BY MOUTH NIGHTLY. 90 tablet 1  . timolol (TIMOPTIC) 0.5 % ophthalmic solution INSTILL ONE DROP INTO BOTH EYES DAILY  3   . valACYclovir (VALTREX) 500 MG tablet TAKE 1 TABLET DAILYFOR COLD SORES 1 TAB TWICE A DAY FOR 3 DAYS IF OUTBREAK THEN BACK TO 1 TAB DAILY  11  . zolpidem (AMBIEN) 5 MG tablet Take 1 tablet (5 mg total) by mouth at bedtime as needed for sleep. 30 tablet 3   No current facility-administered medications for this visit.     Neurologic: Headache: No Seizure: No Paresthesias:No  Musculoskeletal: Strength & Muscle Tone: within normal limits Gait & Station: normal Patient leans: N/A  Psychiatric Specialty Exam: Review of Systems  Psychiatric/Behavioral: Positive for hallucinations (improved). The patient is nervous/anxious (improved).   All other systems reviewed and are negative.   Blood pressure 124/78, pulse 72, temperature 98.3 F (36.8 C), temperature source Oral, weight 173 lb 3.2 oz (78.6 kg).Body mass index is 30.68 kg/m.  General Appearance: Casual  Eye Contact:  Fair  Speech:  Clear and Coherent  Volume:  Normal  Mood:  Anxious situational  Affect:  Congruent  Thought Process:  Goal Directed and Descriptions of Associations: Intact  Orientation:  Full (Time, Place, and Person)  Thought Content:  Hx of Delusions and Hallucinations: Auditory- denies any now , responded to haldol  Suicidal Thoughts:  No  Homicidal Thoughts:  No  Memory:  Immediate;   Fair Recent;   Fair Remote;   Fair  Judgement:  Fair  Insight:  Fair  Psychomotor Activity:  Normal  Concentration:  Concentration: Fair and Attention Span: Fair  Recall:  [employer] of Knowledge:Fair  Language: Fair  Akathisia:  No  Handed:  Right  AIMS (if indicated): 0  Assets:  Communication Skills Desire for Improvement Social Support  ADL's:  Intact  Cognition: WNL  Sleep:  improved    Treatment Plan Summary:Luverne is a 72 year old Caucasian female who was recently diagnosed with delusional disorder and was admitted to [facility] behavioral health unit, presented to the clinic today to  establish care.  Patient denies any delusions or hallucinations  at this time.  She denies any mood symptoms.  She denies any psychosocial stressors.  She is biologically predisposed given her family history of mental health problems as well as her recent diagnosis of thyroid abnormalities.  Patient however will continue to manage her thyroid problems with her primary medical doctor.  She will continue to stay on her medications as noted below. Medication management and Plan as noted below Plan For delusional disorder Continue Haldol 5 mg p.o. nightly. [place]ims equals 0 We will continue Ambien 5 mg p.o. nightly as needed for sleep.  Provided medication education.  Discussed with her that if she continues to do well her Haldol can be gradually tapered down.  Also discussed that she can cut the Ambien into half and take half a dose some days if she wants to.  I have [facility] Per her recent inpatient admission on 10/11/2017 to 10/15/2017.  Patient will continue to follow-up with primary medical doctor for management of hypothyroidism.  Follow-up in clinic in 1 month.  More than 50 % of the time was spent for psychoeducation and supportive psychotherapy and care coordination.  This note was generated in part or whole with voice recognition software. Voice recognition is usually quite accurate but there are transcription errors that can and very often do occur. I apologize for any typographical errors that were not detected and corrected.      Ursula Alert, MD 8/15/20192:58 PM

## 2018-01-30 ENCOUNTER — Encounter: Payer: Self-pay | Admitting: Gastroenterology

## 2018-01-30 ENCOUNTER — Ambulatory Visit (INDEPENDENT_AMBULATORY_CARE_PROVIDER_SITE_OTHER): Payer: Medicare HMO | Admitting: Gastroenterology

## 2018-01-30 VITALS — BP 139/77 | HR 71 | Ht 63.0 in | Wt 174.4 lb

## 2018-01-30 DIAGNOSIS — R197 Diarrhea, unspecified: Secondary | ICD-10-CM | POA: Diagnosis not present

## 2018-01-30 NOTE — Progress Notes (Signed)
Jonathon Bellows MD, MRCP(U.K) [address]  [place]  [place],  [address]  Main: [phone]  Fax: [phone]   Primary Care Physician: Ronnell Freshwater, NP  Primary Gastroenterologist:  Dr. Jonathon Bellows   Chief Complaint  Patient presents with  . Follow-up    Diarrhea    HPI: Stacey Boyd is a 72 y.o. female    Summary of history :  He was really referred and seen in June 2019 for diverticulosis.  She had a CT scan of the abdomen in April 2019 which demonstrated diverticulosis of the terminal ileum and sigmoid colon.  A TSH level was elevated in May 2019 and she had a hemoglobin of 13.9 g.  That point of time she also mentioned she had been having diarrhea on and off for about a year.  It had restarted the day prior to her initial visit, up to 4 bowel movements per day, oatmeal consistency, foul-smelling with no blood seen.  He never had a colonoscopy.  No family history of colon cancer or polyps.  She was consuming sweet and low daily,artificial sugars.  She did consume a lot of salads with bottles of [place] salad dressing.  She had a lot of gas and bloating and diarrhea was more pronounced when she had more gas.  She has also had been taking ibuprofen 4 tablets a week but not on a daily basis for osteoporosis.  Interval history 11/03/2017 to 01/20/2018  6 08/27/2017 C. difficile testing negative, GI PCR negative. 11/07/2017: Colonoscopy:  sessile polyps x5 seen in the ascending colon excised: 1 of the polyps was an adenoma.  Random colon biopsies were negative for microscopic colitis dysplasia and malignancy.  No issues since last visit, 2 bowel movements twice a day , formed, cut down on [place] dressings, sweet n low.      Current Outpatient Medications  Medication Sig Dispense Refill  . atorvastatin (LIPITOR) 10 MG tablet TAKE 1 TABLET (10 MG TOTAL) BY MOUTH DAILY. 90 tablet 0  . azelastine (ASTELIN) 0.1 % nasal spray Place into the nose.    . Cholecalciferol  (VITAMIN D3) 5000 units TABS Take 5,000 Units by mouth daily.     . fluticasone (FLONASE) 50 MCG/ACT nasal spray Place 2 sprays into both nostrils at bedtime. 16 g 2  . haloperidol (HALDOL) 5 MG tablet Take 1 tablet (5 mg total) by mouth at bedtime as needed for agitation. 30 tablet 3  . ibuprofen (ADVIL,MOTRIN) 200 MG tablet Take 400 mg by mouth every 6 (six) hours as needed for pain. Reported on 09/17/2015    . latanoprost (XALATAN) 0.005 % ophthalmic solution 1 drop nightly.    . levothyroxine (SYNTHROID, LEVOTHROID) 25 MCG tablet Take 1 tablet (25 mcg total) by mouth daily before breakfast. 30 tablet 3  . lisinopril-hydrochlorothiazide (PRINZIDE,ZESTORETIC) 20-12.5 MG tablet Take 1 tablet by mouth daily. MUST SCHEDULE ANNUAL EXAM FOR FURTHER REFILLS 90 tablet 0  . loratadine (CLARITIN) 10 MG tablet Take 1 tablet (10 mg total) by mouth daily. 30 tablet 1  . Multiple Vitamins-Minerals (PRESERVISION AREDS 2) CAPS Take 1 capsule by mouth daily.    . propranolol (INDERAL) 40 MG tablet Take 0.5 tablets (20 mg total) by mouth 2 (two) times daily. 60 tablet 1  . rOPINIRole (REQUIP) 2 MG tablet TAKE 1 TABLET (2 MG TOTAL) BY MOUTH NIGHTLY. 90 tablet 1  . timolol (TIMOPTIC) 0.5 % ophthalmic solution INSTILL ONE DROP INTO BOTH EYES DAILY  3  . valACYclovir (  VALTREX) 500 MG tablet TAKE 1 TABLET DAILYFOR COLD SORES 1 TAB TWICE A DAY FOR 3 DAYS IF OUTBREAK THEN BACK TO 1 TAB DAILY  11  . diclofenac sodium (VOLTAREN) 1 % GEL Apply topically.    Marland Kitchen zolpidem (AMBIEN) 5 MG tablet Take 1 tablet (5 mg total) by mouth at bedtime as needed for sleep. (Patient not taking: Reported on 01/30/2018) 30 tablet 3   No current facility-administered medications for this visit.     Allergies as of 01/30/2018 - Review Complete 01/30/2018  Allergen Reaction Noted  . Codeine Hives and Nausea And Vomiting 03/06/2013    ROS:  General: Negative for anorexia, weight loss, fever, chills, fatigue, weakness. ENT: Negative for  hoarseness, difficulty swallowing , nasal congestion. CV: Negative for chest pain, angina, palpitations, dyspnea on exertion, peripheral edema.  Respiratory: Negative for dyspnea at rest, dyspnea on exertion, cough, sputum, wheezing.  GI: See history of present illness. GU:  Negative for dysuria, hematuria, urinary incontinence, urinary frequency, nocturnal urination.  Endo: Negative for unusual weight change.    Physical Examination:   BP 139/77   Pulse 71   Ht 5\' 3"  (1.6 m)   Wt 174 lb 6.4 oz (79.1 kg)   BMI 30.89 kg/m   General: Well-nourished, well-developed in no acute distress.  Eyes: No icterus. Conjunctivae pink. Mouth: Oropharyngeal mucosa moist and pink , no lesions erythema or exudate. Lungs: Clear to auscultation bilaterally. Non-labored. Heart: Regular rate and rhythm, no murmurs rubs or gallops.  Abdomen: Bowel sounds are normal, nontender, nondistended, no hepatosplenomegaly or masses, no abdominal bruits or hernia , no rebound or guarding.   Extremities: No lower extremity edema. No clubbing or deformities. Neuro: Alert and oriented x 3.  Grossly intact. Skin: Warm and dry, no jaundice.   Psych: Alert and cooperative, normal mood and affect.   Imaging Studies: No results found.  Assessment and Plan:   Stacey Boyd is a 72 y.o. y/o femalehistory of on and off diarrhea, possibly related to high consumption of salad and [place] dressing which contains a lot of high fructose corn syrup and can cause an osmotic diarrhea. All symptoms have resolved after she changed her diet and quit all high fructose corn syrup and artificial sugars.     Dr Jonathon Bellows  MD,MRCP [facility]) Follow up PRN

## 2018-02-01 ENCOUNTER — Encounter: Payer: Self-pay | Admitting: Psychiatry

## 2018-02-01 ENCOUNTER — Ambulatory Visit (INDEPENDENT_AMBULATORY_CARE_PROVIDER_SITE_OTHER): Payer: Medicare HMO | Admitting: Psychiatry

## 2018-02-01 ENCOUNTER — Other Ambulatory Visit: Payer: Self-pay

## 2018-02-01 VITALS — BP 139/78 | HR 86 | Temp 97.8°F | Wt 174.8 lb

## 2018-02-01 DIAGNOSIS — F5101 Primary insomnia: Secondary | ICD-10-CM | POA: Diagnosis not present

## 2018-02-01 DIAGNOSIS — F22 Delusional disorders: Secondary | ICD-10-CM | POA: Diagnosis not present

## 2018-02-01 MED ORDER — ZOLPIDEM TARTRATE 5 MG PO TABS: 5.0000 mg | ORAL_TABLET | Freq: Every evening | ORAL | 3 refills | Status: DC | PRN

## 2018-02-01 MED ORDER — HALOPERIDOL 5 MG PO TABS: 2.5000 mg | ORAL_TABLET | Freq: Every day | ORAL | 1 refills | Status: DC

## 2018-02-01 NOTE — Progress Notes (Signed)
[facility] MD OP Progress Note  02/01/2018 12:53 PM Stacey Boyd  MRN:  [id number]  Chief Complaint: ' I am here for follow up.' Chief Complaint    Follow-up; Medication Refill     HPI: Stacey Boyd is a 72 year old Caucasian female, divorced, lives in [place] with a roommate, retired, presented to the clinic today for a follow-up visit.  Patient has a history of delusional disorder, hypertension hypothyroidism.  Patient today reports she does not take the Haldol anymore since she thought she did not have to take it anymore and hence weaned herself off of it.  She reports that when she goes to bed each night she can hear the lady upstairs playing music.  She reports she changes her room sometimes and she is able to sleep through the night.  When she changes her room she does not hear it anymore.  She reports she takes the Ambien at bedtime which helps her to rest.  She reports the voices which she hears from upstairs only happens when she is about to go to bed.  She reports that there is an old lady who moved in to the upstairs apartment and she has an aide who comes in to stay with her at night.  Patient's roommate reports he does not hear anything since he uses CPAP machine at night.  He is also aware of the lady who moved into the upstairs apartment but is not aware about the music playing.  Unknown if these are true delusions or real.  Patient however reports it does not distress her.  Patient denies any suicidality.  Patient denies any homicidality.  Patient continues to have good support system from her roommate. Visit Diagnosis:    ICD-10-CM   1. Delusional disorder (HCC) F22 haloperidol (HALDOL) 5 MG tablet  2. Primary insomnia F51.01 zolpidem (AMBIEN) 5 MG tablet    Past Psychiatric History: I have reviewed past psychiatric history from my progress note on 12/29/2017  Past Medical History:  Past Medical History:  Diagnosis Date  . Allergy   . Arthritis   . Cancer ([place])    skin cancer  on right shoulder.   . Diabetes mellitus without complication ([place])    Pt states she is diet controlled.   . Glaucoma   . Hyperlipidemia   . Hypertension   . Thyroid disease   . Wrist fracture 2001   right    Past Surgical History:  Procedure Laterality Date  . ABDOMINAL HYSTERECTOMY  2005  . APPENDECTOMY  1980  . CARDIAC CATHETERIZATION Left 09/17/2015   Procedure: Left Heart Cath and Coronary Angiography;  Surgeon: Teodoro Spray, MD;  Location: [facility];  Service: Cardiovascular;  Laterality: Left;  . COLONOSCOPY WITH PROPOFOL N/A 11/07/2017   Procedure: COLONOSCOPY WITH PROPOFOL;  Surgeon: Jonathon Bellows, MD;  Location: [facility] ENDOSCOPY;  Service: Gastroenterology;  Laterality: N/A;  . FRACTURE SURGERY      Family Psychiatric History: Have reviewed family psychiatric history from my progress note on 12/29/2017  Family History:  Family History  Problem Relation Age of Onset  . Heart disease Mother        h/o rheumatic fever  . Heart disease Father   . Stroke Father   . Diabetes Sister   . Diabetes Brother   . Diabetes Sister   . Mental illness Other   . Colon cancer Neg Hx   . Breast cancer Neg Hx     Social History: Reviewed social history from my progress  note on 12/29/2017 Social History   Socioeconomic History  . Marital status: Divorced    Spouse name: Not on file  . Number of children: 3  . Years of education: Not on file  . Highest education level: 10th grade  Occupational History  . Not on file  Social Needs  . Financial resource strain: Not hard at all  . Food insecurity:    Worry: Never true    Inability: Never true  . Transportation needs:    Medical: No    Non-medical: No  Tobacco Use  . Smoking status: Former Smoker    Last attempt to quit: 09/17/1979    Years since quitting: 38.4  . Smokeless tobacco: Never Used  Substance and Sexual Activity  . Alcohol use: No  . Drug use: No  . Sexual activity: Yes  Lifestyle  . Physical activity:     Days per week: 0 days    Minutes per session: 0 min  . Stress: Not at all  Relationships  . Social connections:    Talks on phone: Not on file    Gets together: Not on file    Attends religious service: 1 to 4 times per year    Active member of club or organization: No    Attends meetings of clubs or organizations: Never    Relationship status: Divorced  Other Topics Concern  . Not on file  Social History Narrative   3 kids, local   Lives with daughter as of 2016   Divorced ~2002    Allergies:  Allergies  Allergen Reactions  . Codeine Hives and Nausea And Vomiting    Metabolic Disorder Labs: Lab Results  Component Value Date   HGBA1C 6.3 (H) 10/10/2017   MPG 134.11 10/10/2017   No results found for: PROLACTIN Lab Results  Component Value Date   CHOL 188 10/10/2017   TRIG 107 10/10/2017   HDL 43 10/10/2017   CHOLHDL 4.4 10/10/2017   VLDL 21 10/10/2017   LDLCALC 124 (H) 10/10/2017   LDLCALC 105 (H) 08/22/2017   Lab Results  Component Value Date   TSH 5.610 (H) 11/14/2017   TSH 6.283 (H) 10/10/2017   TSH 6.094 (H) 10/10/2017    Therapeutic Level Labs: No results found for: LITHIUM No results found for: VALPROATE No components found for:  CBMZ  Current Medications: Current Outpatient Medications  Medication Sig Dispense Refill  . atorvastatin (LIPITOR) 10 MG tablet TAKE 1 TABLET (10 MG TOTAL) BY MOUTH DAILY. 90 tablet 0  . azelastine (ASTELIN) 0.1 % nasal spray Place into the nose.    . Cholecalciferol (VITAMIN D3) 5000 units TABS Take 5,000 Units by mouth daily.     . diclofenac sodium (VOLTAREN) 1 % GEL Apply topically.    . fluticasone (FLONASE) 50 MCG/ACT nasal spray Place 2 sprays into both nostrils at bedtime. 16 g 2  . haloperidol (HALDOL) 5 MG tablet Take 0.5 tablets (2.5 mg total) by mouth at bedtime. 45 tablet 1  . ibuprofen (ADVIL,MOTRIN) 200 MG tablet Take 400 mg by mouth every 6 (six) hours as needed for pain. Reported on 09/17/2015    .  latanoprost (XALATAN) 0.005 % ophthalmic solution 1 drop nightly.    . levothyroxine (SYNTHROID, LEVOTHROID) 25 MCG tablet Take 1 tablet (25 mcg total) by mouth daily before breakfast. 30 tablet 3  . lisinopril-hydrochlorothiazide (PRINZIDE,ZESTORETIC) 20-12.5 MG tablet Take 1 tablet by mouth daily. MUST SCHEDULE ANNUAL EXAM FOR FURTHER REFILLS 90 tablet 0  . loratadine (  CLARITIN) 10 MG tablet Take 1 tablet (10 mg total) by mouth daily. 30 tablet 1  . Multiple Vitamins-Minerals (PRESERVISION AREDS 2) CAPS Take 1 capsule by mouth daily.    . propranolol (INDERAL) 40 MG tablet Take 0.5 tablets (20 mg total) by mouth 2 (two) times daily. 60 tablet 1  . rOPINIRole (REQUIP) 2 MG tablet TAKE 1 TABLET (2 MG TOTAL) BY MOUTH NIGHTLY. 90 tablet 1  . timolol (TIMOPTIC) 0.5 % ophthalmic solution INSTILL ONE DROP INTO BOTH EYES DAILY  3  . valACYclovir (VALTREX) 500 MG tablet TAKE 1 TABLET DAILYFOR COLD SORES 1 TAB TWICE A DAY FOR 3 DAYS IF OUTBREAK THEN BACK TO 1 TAB DAILY  11  . zolpidem (AMBIEN) 5 MG tablet Take 1 tablet (5 mg total) by mouth at bedtime as needed for sleep. 30 tablet 3   No current facility-administered medications for this visit.      Musculoskeletal: Strength & Muscle Tone: within normal limits Gait & Station: normal Patient leans: N/A  Psychiatric Specialty Exam: Review of Systems  Psychiatric/Behavioral: Negative for depression. The patient is not nervous/anxious.   All other systems reviewed and are negative.   Blood pressure 139/78, pulse 86, temperature 97.8 F (36.6 C), temperature source Oral, weight 174 lb 12.8 oz (79.3 kg).Body mass index is 30.96 kg/m.  General Appearance: Casual  Eye Contact:  Fair  Speech:  Clear and Coherent  Volume:  Normal  Mood:  Euthymic  Affect:  Appropriate  Thought Process:  Goal Directed and Descriptions of Associations: Intact  Orientation:  Full (Time, Place, and Person)  Thought Content: pt continues to talk about a lady playing  music in the apt upstairs , unknown if these are true delusions , she states it does not bother her, only happens when she is about to sleep    Suicidal Thoughts:  No  Homicidal Thoughts:  No  Memory:  Immediate;   Fair Recent;   Fair Remote;   Fair  Judgement:  Fair  Insight:  Fair  Psychomotor Activity:  Normal  Concentration:  Concentration: Fair and Attention Span: Fair  Recall:  [employer] of Knowledge: Fair  Language: Fair  Akathisia:  No  Handed:  Right  AIMS (if indicated): denies tremors,rigidity,stiffness  Assets:  Communication Skills Desire for Improvement Housing Social Support  ADL's:  Intact  Cognition: WNL  Sleep:  Fair   Screenings: AUDIT     Admission (Discharged) from 10/10/2017 in [place]  Alcohol Use Disorder Identification Test Final Score (AUDIT)  0    PHQ2-9     Office Visit from 11/10/2017 in [facility] Office Visit from 08/16/2017 in [facility], [place] Visit from 09/18/2013 in [facility] [place]  PHQ-2 Total Score  0  6  0  PHQ-9 Total Score  -  12  -       Assessment and Plan: Sherma is a 72 year old Caucasian female who has a history of delusional disorder, insomnia, hypertension, thyroid disease, presented to the clinic today for a follow-up visit.  Patient reports some possible delusions but reports that it does not distress her.  Patient continues to have stable mood as well as reports sleep is fair.  Patient will continue to follow-up with her primary medical doctor for management of her thyroid problems which she reports is better.  Will continue plan as noted below.  Plan For delusional disorder Change Haldol to 2.5 mg p.o. nightly.  Patient  reports she had stopped taking the Haldol altogether and decided she did not need it.  However it is likely that she may have possible delusions which she discussed as noted above.  Hence discussed with her to restart Haldol at a  lower dose of 2.5 and take it daily.   For insomnia Continue Ambien 5 mg p.o. nightly as needed  Patient to follow-up in clinic in 1 month or sooner if needed.  More than 50 % of the time was spent for psychoeducation and supportive psychotherapy and care coordination.  This note was generated in part or whole with voice recognition software. Voice recognition is usually quite accurate but there are transcription errors that can and very often do occur. I apologize for any typographical errors that were not detected and corrected.      Ursula Alert, MD 02/01/2018, 12:53 PM

## 2018-02-10 ENCOUNTER — Encounter: Payer: Self-pay | Admitting: Nurse Practitioner

## 2018-02-10 ENCOUNTER — Ambulatory Visit (INDEPENDENT_AMBULATORY_CARE_PROVIDER_SITE_OTHER): Payer: Medicare HMO | Admitting: Nurse Practitioner

## 2018-02-10 VITALS — BP 147/68 | HR 64 | Resp 16 | Ht 63.0 in | Wt 176.8 lb

## 2018-02-10 DIAGNOSIS — Z23 Encounter for immunization: Secondary | ICD-10-CM | POA: Insufficient documentation

## 2018-02-10 DIAGNOSIS — E039 Hypothyroidism, unspecified: Secondary | ICD-10-CM

## 2018-02-10 DIAGNOSIS — E1165 Type 2 diabetes mellitus with hyperglycemia: Secondary | ICD-10-CM | POA: Diagnosis not present

## 2018-02-10 DIAGNOSIS — E049 Nontoxic goiter, unspecified: Secondary | ICD-10-CM | POA: Insufficient documentation

## 2018-02-10 DIAGNOSIS — I10 Essential (primary) hypertension: Secondary | ICD-10-CM | POA: Diagnosis not present

## 2018-02-10 LAB — POCT GLYCOSYLATED HEMOGLOBIN (HGB A1C): HEMOGLOBIN A1C: 6.7 % — AB (ref 4.0–5.6)

## 2018-02-10 NOTE — Progress Notes (Signed)
[facility] [place], [place] [address]  Internal MEDICINE  Office Visit Note  Patient Name: Stacey Boyd  [id number]  [id number]  Date of Service: 02/10/2018  Chief Complaint  Patient presents with  . Peripheral Neuropathy    3 month follow up  . Medical Management of Chronic Issues    pt have some concerns about her thyroids    The patient states that she has been having a lot of problems with her thyroid. Eyes were getting very dry and the eye doctor feels like this is likely related to thyroid problems.she did have plugs placed in her tear ducts. Does have some blurry vision off and on. She will be seeing her eye doctor again in October, 2019. She is also having some trouble with her voice. Is hoarse and weak. Has interfered with her ability to talk. She was recently started on levothyroxine 1mcg daily. It is time to have this rechecked. She has not had thyroid ultrasound at this point. Blood sugars are doing well. She is exercising more frequently. States that she is out and walking about 15 minutes every day.       Current Medication: Outpatient Encounter Medications as of 02/10/2018  Medication Sig  . atorvastatin (LIPITOR) 10 MG tablet TAKE 1 TABLET (10 MG TOTAL) BY MOUTH DAILY.  Marland Kitchen azelastine (ASTELIN) 0.1 % nasal spray Place into the nose.  . Cholecalciferol (VITAMIN D3) 5000 units TABS Take 5,000 Units by mouth daily.   . diclofenac sodium (VOLTAREN) 1 % GEL Apply topically.  . fluticasone (FLONASE) 50 MCG/ACT nasal spray Place 2 sprays into both nostrils at bedtime.  . haloperidol (HALDOL) 5 MG tablet Take 0.5 tablets (2.5 mg total) by mouth at bedtime.  Marland Kitchen ibuprofen (ADVIL,MOTRIN) 200 MG tablet Take 400 mg by mouth every 6 (six) hours as needed for pain. Reported on 09/17/2015  . latanoprost (XALATAN) 0.005 % ophthalmic solution 1 drop nightly.  . levothyroxine (SYNTHROID, LEVOTHROID) 25 MCG tablet Take 1 tablet (25 mcg total) by mouth daily  before breakfast.  . lisinopril-hydrochlorothiazide (PRINZIDE,ZESTORETIC) 20-12.5 MG tablet Take 1 tablet by mouth daily. MUST SCHEDULE ANNUAL EXAM FOR FURTHER REFILLS  . loratadine (CLARITIN) 10 MG tablet Take 1 tablet (10 mg total) by mouth daily.  . Multiple Vitamins-Minerals (PRESERVISION AREDS 2) CAPS Take 1 capsule by mouth daily.  . propranolol (INDERAL) 40 MG tablet Take 0.5 tablets (20 mg total) by mouth 2 (two) times daily.  Marland Kitchen rOPINIRole (REQUIP) 2 MG tablet TAKE 1 TABLET (2 MG TOTAL) BY MOUTH NIGHTLY.  . timolol (TIMOPTIC) 0.5 % ophthalmic solution INSTILL ONE DROP INTO BOTH EYES DAILY  . valACYclovir (VALTREX) 500 MG tablet TAKE 1 TABLET DAILYFOR COLD SORES 1 TAB TWICE A DAY FOR 3 DAYS IF OUTBREAK THEN BACK TO 1 TAB DAILY  . zolpidem (AMBIEN) 5 MG tablet Take 1 tablet (5 mg total) by mouth at bedtime as needed for sleep.   No facility-administered encounter medications on file as of 02/10/2018.     Surgical History: Past Surgical History:  Procedure Laterality Date  . ABDOMINAL HYSTERECTOMY  2005  . APPENDECTOMY  1980  . CARDIAC CATHETERIZATION Left 09/17/2015   Procedure: Left Heart Cath and Coronary Angiography;  Surgeon: Teodoro Spray, MD;  Location: [facility];  Service: Cardiovascular;  Laterality: Left;  . COLONOSCOPY WITH PROPOFOL N/A 11/07/2017   Procedure: COLONOSCOPY WITH PROPOFOL;  Surgeon: Jonathon Bellows, MD;  Location: [facility];  Service: Gastroenterology;  Laterality: N/A;  .  FRACTURE SURGERY      Medical History: Past Medical History:  Diagnosis Date  . Allergy   . Arthritis   . Cancer ([place])    skin cancer on right shoulder.   . Diabetes mellitus without complication ([place])    Pt states she is diet controlled.   . Glaucoma   . Hyperlipidemia   . Hypertension   . Thyroid disease   . Wrist fracture 2001   right    Family History: Family History  Problem Relation Age of Onset  . Heart disease Mother        h/o rheumatic fever  . Heart  disease Father   . Stroke Father   . Diabetes Sister   . Diabetes Brother   . Diabetes Sister   . Mental illness Other   . Colon cancer Neg Hx   . Breast cancer Neg Hx     Social History   Socioeconomic History  . Marital status: Divorced    Spouse name: Not on file  . Number of children: 3  . Years of education: Not on file  . Highest education level: 10th grade  Occupational History  . Not on file  Social Needs  . Financial resource strain: Not hard at all  . Food insecurity:    Worry: Never true    Inability: Never true  . Transportation needs:    Medical: No    Non-medical: No  Tobacco Use  . Smoking status: Former Smoker    Last attempt to quit: 09/17/1979    Years since quitting: 38.4  . Smokeless tobacco: Never Used  Substance and Sexual Activity  . Alcohol use: No  . Drug use: No  . Sexual activity: Yes  Lifestyle  . Physical activity:    Days per week: 0 days    Minutes per session: 0 min  . Stress: Not at all  Relationships  . Social connections:    Talks on phone: Not on file    Gets together: Not on file    Attends religious service: 1 to 4 times per year    Active member of club or organization: No    Attends meetings of clubs or organizations: Never    Relationship status: Divorced  . Intimate partner violence:    Fear of current or ex partner: No    Emotionally abused: No    Physically abused: No    Forced sexual activity: No  Other Topics Concern  . Not on file  Social History Narrative   3 kids, local   Lives with daughter as of 2016   Divorced ~2002      Review of Systems  Constitutional: Negative for activity change, chills, fatigue and unexpected weight change.  HENT: Positive for voice change. Negative for congestion, postnasal drip, rhinorrhea, sneezing and sore throat.   Eyes: Positive for visual disturbance. Negative for redness.       Eye dryness and intermittent blurry vision.   Respiratory: Negative for cough, chest  tightness, shortness of breath and wheezing.   Cardiovascular: Negative for chest pain and palpitations.  Gastrointestinal: Negative for abdominal pain, constipation, diarrhea, nausea and vomiting.  Endocrine: Negative for cold intolerance, heat intolerance, polydipsia, polyphagia and polyuria.       Thyroid problems. Feels swelling in her throat.   Genitourinary: Negative for dysuria and frequency.  Musculoskeletal: Positive for back pain. Negative for arthralgias, joint swelling, myalgias and neck pain.  Skin: Negative for rash.  Allergic/Immunologic: Negative for environmental allergies.  Neurological: Positive for weakness and headaches. Negative for tremors and numbness.       Believes she is having some issues with her memory.   Hematological: Negative for adenopathy. Does not bruise/bleed easily.  Psychiatric/Behavioral: Positive for confusion, hallucinations and sleep disturbance. Negative for behavioral problems (Depression) and suicidal ideas. The patient is not nervous/anxious.        Recent hospitalization for psychiatric issues.     Today's Vitals   02/10/18 0917  BP: (!) 147/68  Pulse: 64  Resp: 16  SpO2: 96%  Weight: 176 lb 12.8 oz (80.2 kg)  Height: 5\' 3"  (1.6 m)   Physical Exam  Constitutional: She is oriented to person, place, and time. She appears well-developed and well-nourished. No distress.  HENT:  Head: Normocephalic and atraumatic.  Nose: Nose normal.  Mouth/Throat: Oropharynx is clear and moist. No oropharyngeal exudate.  Eyes: Pupils are equal, round, and reactive to light. Conjunctivae and EOM are normal.  Neck: Normal range of motion. Neck supple. No JVD present. Carotid bruit is not present. No tracheal deviation present. Thyromegaly present.  Mild thyromegaly  Cardiovascular: Normal rate, regular rhythm and normal heart sounds. Exam reveals no gallop and no friction rub.  No murmur heard. Pulmonary/Chest: Effort normal and breath sounds normal. No  respiratory distress. She has no wheezes. She has no rales. She exhibits no tenderness.  Abdominal: Soft. Bowel sounds are normal. There is no tenderness.  Lymphadenopathy:    She has no cervical adenopathy.  Neurological: She is alert and oriented to person, place, and time. No cranial nerve deficit.  Skin: Skin is warm and dry. She is not diaphoretic.  Psychiatric: Her speech is normal and behavior is normal. Her mood appears anxious. Thought content is paranoid and delusional. Cognition and memory are impaired. She expresses impulsivity. She exhibits a depressed mood. She expresses no homicidal and no suicidal ideation.  She is seeing psychiatric provider every month. Symptoms are well managed overall.   Nursing note and vitals reviewed.   Assessment/Plan: 1. Uncontrolled type 2 diabetes mellitus with hyperglycemia (HCC) - POCT HgB A1C 6.7 today. Continue to control with diet and exercise. Monitor blood sugars closely  2. Acquired hypothyroidism Check thyroid panel and adjust levothyroxine dose as indicated.  - TSH + free T4  3. Enlarged thyroid Will ultrasound the thyroid for further evaluatioin. - US Soft Tissue Head/Neck; Future  4. Essential hypertension, benign Generally stable. Continue bp medication as prescribed.   5. Needs flu shot - Flu Vaccine MDCK QUAD PF  General Counseling: Tulani verbalizes understanding of the findings of todays visit and agrees with plan of treatment. I have discussed any further diagnostic evaluation that may be needed or ordered today. We also reviewed her medications today. she has been encouraged to call the office with any questions or concerns that should arise related to todays visit.   Hypertension Counseling:   The following hypertensive lifestyle modification were recommended and discussed:  1. Limiting alcohol intake to less than 1 oz/day of ethanol:(24 oz of beer or 8 oz of wine or 2 oz of 100-proof whiskey). 2. Take baby ASA 81 mg  daily. 3. Importance of regular aerobic exercise and losing weight. 4. Reduce dietary saturated fat and cholesterol intake for overall cardiovascular health. 5. Maintaining adequate dietary potassium, calcium, and magnesium intake. 6. Regular monitoring of the blood pressure. 7. Reduce sodium intake to less than 100 mmol/day (less than 2.3 gm of sodium or less than 6 gm of sodium choride)  This patient was seen by Leretha Pol FNP Collaboration with Dr Lavera Guise as a part of collaborative care agreement  Orders Placed This Encounter  Procedures  . US Soft Tissue Head/Neck  . Flu Vaccine MDCK QUAD PF  . TSH + free T4  . POCT HgB A1C      Time spent: 25 Minutes      Dr Lavera Guise Internal medicine

## 2018-02-13 ENCOUNTER — Other Ambulatory Visit: Payer: Self-pay | Admitting: Nurse Practitioner

## 2018-02-14 LAB — T3: T3 TOTAL: 82 ng/dL (ref 71–180)

## 2018-02-14 LAB — TSH: TSH: 2.9 u[IU]/mL (ref 0.450–4.500)

## 2018-02-14 LAB — T4, FREE: Free T4: 1.37 ng/dL (ref 0.82–1.77)

## 2018-02-16 ENCOUNTER — Other Ambulatory Visit: Payer: Self-pay | Admitting: Nurse Practitioner

## 2018-02-16 DIAGNOSIS — E039 Hypothyroidism, unspecified: Secondary | ICD-10-CM

## 2018-02-16 MED ORDER — LEVOTHYROXINE SODIUM 25 MCG PO TABS: 25.0000 ug | ORAL_TABLET | Freq: Every day | ORAL | 3 refills | Status: DC

## 2018-02-17 ENCOUNTER — Ambulatory Visit (INDEPENDENT_AMBULATORY_CARE_PROVIDER_SITE_OTHER): Payer: Medicare HMO

## 2018-02-17 DIAGNOSIS — E049 Nontoxic goiter, unspecified: Secondary | ICD-10-CM

## 2018-03-03 ENCOUNTER — Ambulatory Visit (INDEPENDENT_AMBULATORY_CARE_PROVIDER_SITE_OTHER): Payer: Medicare HMO | Admitting: Psychiatry

## 2018-03-03 ENCOUNTER — Encounter: Payer: Self-pay | Admitting: Psychiatry

## 2018-03-03 VITALS — BP 118/62 | HR 53 | Ht 62.75 in | Wt 177.0 lb

## 2018-03-03 DIAGNOSIS — F5101 Primary insomnia: Secondary | ICD-10-CM | POA: Diagnosis not present

## 2018-03-03 DIAGNOSIS — F22 Delusional disorders: Secondary | ICD-10-CM

## 2018-03-03 NOTE — Progress Notes (Signed)
[facility] OP Progress Note  03/03/2018 10:22 AM Stacey Boyd  MRN:  [id number]  Chief Complaint: ' I am here for follow up.' Chief Complaint    Follow-up     HPI: Stacey Boyd is a 72 yr old Caucasian female, divorced, lives in [place] with a roommate, retired, presented to the clinic today for a follow-up visit.  Patient has a history of delusional disorder, hypertension, hypothyroidism.  Patient today presented along with her roommate.  Patient's roommate provided collateral information.  Patient reports she is currently doing well with regards to her mood symptoms.  She denies any significant sadness or anxiety symptoms at this time.  She reports she continues to hear some voices from the upstairs apartment especially at night.  She reports she sometimes wake up hearing it but it does not bother her too much.  She reports she is able to go back to sleep again without any problem.  She reports she does not ruminate about the voices.  Her roommate reports hearing some noises from the upstairs apartment where an elderly couple stays.  Unknown if the patient is hearing the same voices or not.  Patient however does not appear to be distressed and denies any mood lability this time.  Patient denies any suicidality or homicidality.  Patient reports sleep is improved on the Ambien.  Patient is alert, oriented to person place situation and was able to answer questions appropriately. Visit Diagnosis:    ICD-10-CM   1. Delusional disorder ([place]) F22    improving  2. Primary insomnia F51.01     Past Psychiatric History: I have reviewed past psychiatric history from my progress note on 12/29/2017  Past Medical History:  Past Medical History:  Diagnosis Date  . Allergy   . Arthritis   . Cancer ([place])    skin cancer on right shoulder.   . Diabetes mellitus without complication ([place])    Pt states she is diet controlled.   . Glaucoma   . Hyperlipidemia   . Hypertension   . Thyroid disease   .  Wrist fracture 2001   right    Past Surgical History:  Procedure Laterality Date  . ABDOMINAL HYSTERECTOMY  2005  . APPENDECTOMY  1980  . CARDIAC CATHETERIZATION Left 09/17/2015   Procedure: Left Heart Cath and Coronary Angiography;  Surgeon: Teodoro Spray, MD;  Location: [facility];  Service: Cardiovascular;  Laterality: Left;  . COLONOSCOPY WITH PROPOFOL N/A 11/07/2017   Procedure: COLONOSCOPY WITH PROPOFOL;  Surgeon: Jonathon Bellows, MD;  Location: [facility];  Service: Gastroenterology;  Laterality: N/A;  . FRACTURE SURGERY      Family Psychiatric History: I have reviewed family psychiatric history from my progress note on 12/29/2017  Family History:  Family History  Problem Relation Age of Onset  . Heart disease Mother        h/o rheumatic fever  . Heart disease Father   . Stroke Father   . Diabetes Sister   . Diabetes Brother   . Diabetes Sister   . Mental illness Other   . Colon cancer Neg Hx   . Breast cancer Neg Hx     Social History: I have reviewed social history from my progress note on 12/29/2017 Social History   Socioeconomic History  . Marital status: Divorced    Spouse name: Not on file  . Number of children: 3  . Years of education: Not on file  . Highest education level: 10th grade  Occupational History  .  Not on file  Social Needs  . Financial resource strain: Not hard at all  . Food insecurity:    Worry: Never true    Inability: Never true  . Transportation needs:    Medical: No    Non-medical: No  Tobacco Use  . Smoking status: Former Smoker    Last attempt to quit: 09/17/1979    Years since quitting: 38.4  . Smokeless tobacco: Never Used  Substance and Sexual Activity  . Alcohol use: No  . Drug use: No  . Sexual activity: Yes    Partners: Male    Birth control/protection: None  Lifestyle  . Physical activity:    Days per week: 0 days    Minutes per session: 0 min  . Stress: Not at all  Relationships  . Social connections:     Talks on phone: Not on file    Gets together: Not on file    Attends religious service: 1 to 4 times per year    Active member of club or organization: No    Attends meetings of clubs or organizations: Never    Relationship status: Divorced  Other Topics Concern  . Not on file  Social History Narrative   3 kids, local   Lives with daughter as of 2016   Divorced ~2002    Allergies:  Allergies  Allergen Reactions  . Codeine Hives and Nausea And Vomiting    Metabolic Disorder Labs: Lab Results  Component Value Date   HGBA1C 6.7 (A) 02/10/2018   MPG 134.11 10/10/2017   No results found for: PROLACTIN Lab Results  Component Value Date   CHOL 188 10/10/2017   TRIG 107 10/10/2017   HDL 43 10/10/2017   CHOLHDL 4.4 10/10/2017   VLDL 21 10/10/2017   LDLCALC 124 (H) 10/10/2017   LDLCALC 105 (H) 08/22/2017   Lab Results  Component Value Date   TSH 2.900 02/13/2018   TSH 5.610 (H) 11/14/2017    Therapeutic Level Labs: No results found for: LITHIUM No results found for: VALPROATE No components found for:  CBMZ  Current Medications: Current Outpatient Medications  Medication Sig Dispense Refill  . atorvastatin (LIPITOR) 10 MG tablet TAKE 1 TABLET (10 MG TOTAL) BY MOUTH DAILY. 90 tablet 0  . azelastine (ASTELIN) 0.1 % nasal spray Place into the nose.    . brinzolamide (AZOPT) 1 % ophthalmic suspension Place 1 drop into both eyes 2 (two) times daily.    . Cholecalciferol (VITAMIN D3) 5000 units TABS Take 5,000 Units by mouth daily.     . diclofenac sodium (VOLTAREN) 1 % GEL Apply topically.    . fluticasone (FLONASE) 50 MCG/ACT nasal spray Place 2 sprays into both nostrils at bedtime. 16 g 2  . haloperidol (HALDOL) 5 MG tablet Take 0.5 tablets (2.5 mg total) by mouth at bedtime. 45 tablet 1  . ibuprofen (ADVIL,MOTRIN) 200 MG tablet Take 400 mg by mouth every 6 (six) hours as needed for pain. Reported on 09/17/2015    . latanoprost (XALATAN) 0.005 % ophthalmic solution 1 drop  nightly.    . levothyroxine (SYNTHROID, LEVOTHROID) 25 MCG tablet Take 1 tablet (25 mcg total) by mouth daily before breakfast. 30 tablet 3  . lisinopril-hydrochlorothiazide (PRINZIDE,ZESTORETIC) 20-12.5 MG tablet Take 1 tablet by mouth daily. MUST SCHEDULE ANNUAL EXAM FOR FURTHER REFILLS 90 tablet 0  . loratadine (CLARITIN) 10 MG tablet Take 1 tablet (10 mg total) by mouth daily. 30 tablet 1  . Multiple Vitamins-Minerals (PRESERVISION AREDS 2) CAPS  Take 1 capsule by mouth daily.    . propranolol (INDERAL) 40 MG tablet Take 0.5 tablets (20 mg total) by mouth 2 (two) times daily. 60 tablet 1  . rOPINIRole (REQUIP) 2 MG tablet TAKE 1 TABLET (2 MG TOTAL) BY MOUTH NIGHTLY. 90 tablet 1  . timolol (TIMOPTIC) 0.5 % ophthalmic solution INSTILL ONE DROP INTO BOTH EYES DAILY  3  . valACYclovir (VALTREX) 500 MG tablet TAKE 1 TABLET DAILYFOR COLD SORES 1 TAB TWICE A DAY FOR 3 DAYS IF OUTBREAK THEN BACK TO 1 TAB DAILY  11  . zolpidem (AMBIEN) 5 MG tablet Take 1 tablet (5 mg total) by mouth at bedtime as needed for sleep. 30 tablet 3   No current facility-administered medications for this visit.      Musculoskeletal: Strength & Muscle Tone: within normal limits Gait & Station: normal Patient leans: N/A  Psychiatric Specialty Exam: Review of Systems  Psychiatric/Behavioral: The patient has insomnia (improved).   All other systems reviewed and are negative.   Blood pressure 118/62, pulse (!) 53, height 5' 2.75" (1.594 m), weight 177 lb (80.3 kg).Body mass index is 31.6 kg/m.  General Appearance: Casual  Eye Contact:  Fair  Speech:  Clear and Coherent  Volume:  Normal  Mood:  Euthymic  Affect:  Congruent  Thought Process:  Goal Directed and Descriptions of Associations: Intact  Orientation:  Full (Time, Place, and Person)  Thought Content: Logical hx of delusions as well as reports some possible AH on and off - does not distress her  Suicidal Thoughts:  No  Homicidal Thoughts:  No  Memory:   Immediate;   Fair Recent;   Fair Remote;   Fair  Judgement:  Fair  Insight:  Fair  Psychomotor Activity:  Normal  Concentration:  Concentration: Fair and Attention Span: Fair  Recall:  [employer] of Knowledge: Fair  Language: Fair  Akathisia:  No  Handed:  Right  AIMS (if indicated):0  Assets:  Communication Skills Desire for Improvement Social Support  ADL's:  Intact  Cognition: WNL  Sleep:  improved   Screenings: AIMS     Office Visit from 03/03/2018 in [facility] Total Score  0    AUDIT     Admission (Discharged) from 10/10/2017 in [facility]  Alcohol Use Disorder Identification Test Final Score (AUDIT)  0    PHQ2-9     Office Visit from 11/10/2017 in [facility] Office Visit from 08/16/2017 in [facility] [place] Visit from 09/18/2013 in [facility]  PHQ-2 Total Score  0  6  0  PHQ-9 Total Score  -  12  -       Assessment and Plan: Prentiss is a 72 year old Caucasian female who has a history of delusional disorder, insomnia, hypertension, thyroid disease, presented to the clinic today for a follow-up visit.  Patient today reports she is currently doing well on the current medication regimen.  Denies any overt psychosis or sleep problems.  Patient continues to have good social support from her roommate.  Plan as noted below.  Plan For delusional disorder-improving Haldol 2.5 mg p.o. nightly.  For insomnia Ambien 5 mg p.o. nightly as needed  Follow-up in clinic in 2-3 months or sooner if needed.  More than 50 % of the time was spent for psychoeducation and supportive psychotherapy and care coordination.  This note was generated in part or whole with voice recognition software. Voice  recognition is usually quite accurate but there are transcription errors that can and very often do occur. I apologize for any typographical errors that were not detected and  corrected.       Ursula Alert, MD 03/03/2018, 10:22 AM

## 2018-03-13 ENCOUNTER — Other Ambulatory Visit: Payer: Self-pay

## 2018-03-13 MED ORDER — ATORVASTATIN CALCIUM 10 MG PO TABS: ORAL_TABLET | ORAL | 0 refills | Status: DC

## 2018-03-27 ENCOUNTER — Ambulatory Visit (INDEPENDENT_AMBULATORY_CARE_PROVIDER_SITE_OTHER): Payer: Medicare HMO | Admitting: Adult Health

## 2018-03-27 ENCOUNTER — Encounter: Payer: Self-pay | Admitting: Nurse Practitioner

## 2018-03-27 VITALS — BP 134/75 | HR 56 | Resp 16 | Ht 63.0 in | Wt 176.4 lb

## 2018-03-27 DIAGNOSIS — E049 Nontoxic goiter, unspecified: Secondary | ICD-10-CM

## 2018-03-27 DIAGNOSIS — M533 Sacrococcygeal disorders, not elsewhere classified: Secondary | ICD-10-CM

## 2018-03-27 DIAGNOSIS — E7800 Pure hypercholesterolemia, unspecified: Secondary | ICD-10-CM | POA: Diagnosis not present

## 2018-03-27 MED ORDER — ATORVASTATIN CALCIUM 10 MG PO TABS: ORAL_TABLET | ORAL | 1 refills | Status: DC

## 2018-03-27 NOTE — Patient Instructions (Signed)
Tailbone Injury The tailbone is the small bone at the lower end of the backbone (spine). You may have stretched tissues, bruises, or a broken bone (fracture). These injuries can be painful. Most tailbone injuries get better on their own in 4-6 weeks. Follow these instructions at home:  Take medicines only as told by your doctor.  If told, apply ice to the injured area. ? Put ice in a plastic bag. ? Place a towel between your skin and the bag. ? Leave the ice on for 20 minutes, 2-3 times per day. Do this for the first 1-2 days.  Sit on a large, rubber or inflated ring or cushion to lessen pain. Lean forward when you sit to help lessen pain.  Avoid sitting in one place for a long time.  Increase your activity as the pain allows.  Do exercises as told by your doctor or physical therapist.  If it is painful to poop, take medicine to help you poop (stool softeners) as told by your doctor.  Eat foods that have plenty of fiber.  Keep all follow-up visits as told by your doctor. This is important. Contact a doctor if:  Your pain gets worse.  Pooping causes you pain.  You cannot poop (constipation).  You are leaking pee (urinary incontinence).  You have a fever. This information is not intended to replace advice given to you by your health care provider. Make sure you discuss any questions you have with your health care provider. Document Released: 06/05/2010 Document Revised: 01/01/2016 Document Reviewed: 04/29/2014 Elsevier Interactive Patient Education  [employer].

## 2018-03-27 NOTE — Progress Notes (Signed)
[facility] [place], [place] [address]  Internal MEDICINE  Office Visit Note  Patient Name: Stacey Boyd  [id number]  [id number]  Date of Service: 03/27/2018  Chief Complaint  Patient presents with  . Medical Management of Chronic Issues    6wk follow up. medication refill. pt is also having issues with her tailbone. stinging and burning sensation and cant sit down properly.  . Diabetes  . Hypothyroidism    HPI  Patient is here for follow-up on diabetes, hypothyroidism.  She is also complaining of pain near her coccyx.  She reports multiple years of this pain however recently she has sat down hard and feels like she has may broken it again.  She says it is uncomfortable to stand or sit at this time.  She denies any further issues or complaint.  Her most recent A1c was 6.7 and her diabetes appears well controlled.  Her thyroid is equally as controlled based on her labs done 2 months ago.  Continue for the future.   Current Medication: Outpatient Encounter Medications as of 03/27/2018  Medication Sig  . atorvastatin (LIPITOR) 10 MG tablet TAKE 1 TABLET (10 MG TOTAL) BY MOUTH DAILY.  . brinzolamide (AZOPT) 1 % ophthalmic suspension Place 1 drop into both eyes 2 (two) times daily.  . Cholecalciferol (VITAMIN D3) 5000 units TABS Take 5,000 Units by mouth daily.   . fluticasone (FLONASE) 50 MCG/ACT nasal spray Place 2 sprays into both nostrils at bedtime.  . haloperidol (HALDOL) 5 MG tablet Take 0.5 tablets (2.5 mg total) by mouth at bedtime.  Marland Kitchen ibuprofen (ADVIL,MOTRIN) 200 MG tablet Take 400 mg by mouth every 6 (six) hours as needed for pain. Reported on 09/17/2015  . latanoprost (XALATAN) 0.005 % ophthalmic solution 1 drop nightly.  . levothyroxine (SYNTHROID, LEVOTHROID) 25 MCG tablet Take 1 tablet (25 mcg total) by mouth daily before breakfast.  . lisinopril-hydrochlorothiazide (PRINZIDE,ZESTORETIC) 20-12.5 MG tablet Take 1 tablet by mouth daily. MUST  SCHEDULE ANNUAL EXAM FOR FURTHER REFILLS  . loratadine (CLARITIN) 10 MG tablet Take 1 tablet (10 mg total) by mouth daily.  . Multiple Vitamins-Minerals (PRESERVISION AREDS 2) CAPS Take 1 capsule by mouth daily.  . propranolol (INDERAL) 40 MG tablet Take 0.5 tablets (20 mg total) by mouth 2 (two) times daily.  Marland Kitchen rOPINIRole (REQUIP) 2 MG tablet TAKE 1 TABLET (2 MG TOTAL) BY MOUTH NIGHTLY.  . timolol (TIMOPTIC) 0.5 % ophthalmic solution INSTILL ONE DROP INTO BOTH EYES DAILY  . valACYclovir (VALTREX) 500 MG tablet TAKE 1 TABLET DAILYFOR COLD SORES 1 TAB TWICE A DAY FOR 3 DAYS IF OUTBREAK THEN BACK TO 1 TAB DAILY  . zolpidem (AMBIEN) 5 MG tablet Take 1 tablet (5 mg total) by mouth at bedtime as needed for sleep.  . [DISCONTINUED] atorvastatin (LIPITOR) 10 MG tablet TAKE 1 TABLET (10 MG TOTAL) BY MOUTH DAILY.   No facility-administered encounter medications on file as of 03/27/2018.     Surgical History: Past Surgical History:  Procedure Laterality Date  . ABDOMINAL HYSTERECTOMY  2005  . APPENDECTOMY  1980  . CARDIAC CATHETERIZATION Left 09/17/2015   Procedure: Left Heart Cath and Coronary Angiography;  Surgeon: Teodoro Spray, MD;  Location: [facility] [place];  Service: Cardiovascular;  Laterality: Left;  . COLONOSCOPY WITH PROPOFOL N/A 11/07/2017   Procedure: COLONOSCOPY WITH PROPOFOL;  Surgeon: Jonathon Bellows, MD;  Location: [facility] ENDOSCOPY;  Service: Gastroenterology;  Laterality: N/A;  . [place]  History: Past Medical History:  Diagnosis Date  . Allergy   . Arthritis   . Cancer ([address])    skin cancer on right shoulder.   . Diabetes mellitus without complication ([place])    Pt states she is diet controlled.   . Glaucoma   . Hyperlipidemia   . Hypertension   . Thyroid disease   . Wrist fracture 2001   right    Family History: Family History  Problem Relation Age of Onset  . Heart disease Mother        h/o rheumatic fever  . Heart disease Father   . Stroke  Father   . Diabetes Sister   . Diabetes Brother   . Diabetes Sister   . Mental illness Other   . Colon cancer Neg Hx   . Breast cancer Neg Hx     Social History   Socioeconomic History  . Marital status: Divorced    Spouse name: Not on file  . Number of children: 3  . Years of education: Not on file  . Highest education level: 10th grade  Occupational History  . Not on file  Social Needs  . Financial resource strain: Not hard at all  . Food insecurity:    Worry: Never true    Inability: Never true  . Transportation needs:    Medical: No    Non-medical: No  Tobacco Use  . Smoking status: Former Smoker    Last attempt to quit: 09/17/1979    Years since quitting: 38.5  . Smokeless tobacco: Never Used  Substance and Sexual Activity  . Alcohol use: No  . Drug use: No  . Sexual activity: Yes    Partners: Male    Birth control/protection: None  Lifestyle  . Physical activity:    Days per week: 0 days    Minutes per session: 0 min  . Stress: Not at all  Relationships  . Social connections:    Talks on phone: Not on file    Gets together: Not on file    Attends religious service: 1 to 4 times per year    Active member of club or organization: No    Attends meetings of clubs or organizations: Never    Relationship status: Divorced  . Intimate partner violence:    Fear of current or ex partner: No    Emotionally abused: No    Physically abused: No    Forced sexual activity: No  Other Topics Concern  . Not on file  Social History Narrative   3 kids, local   Lives with daughter as of 2016   Divorced ~2002      Review of Systems  Constitutional: Negative for chills, fatigue and unexpected weight change.  HENT: Negative for congestion, rhinorrhea, sneezing and sore throat.   Eyes: Negative for photophobia, pain and redness.  Respiratory: Negative for cough, chest tightness and shortness of breath.   Cardiovascular: Negative for chest pain and palpitations.   Gastrointestinal: Negative for abdominal pain, constipation, diarrhea, nausea and vomiting.  Endocrine: Negative.   Genitourinary: Negative for dysuria and frequency.  Musculoskeletal: Negative for arthralgias, back pain, joint swelling and neck pain.  Skin: Negative for rash.  Allergic/Immunologic: Negative.   Neurological: Negative for tremors and numbness.  Hematological: Negative for adenopathy. Does not bruise/bleed easily.  Psychiatric/Behavioral: Negative for behavioral problems and sleep disturbance. The patient is not nervous/anxious.     Vital Signs: BP 134/75 (BP Location: Right Arm, Patient Position: Sitting, Cuff Size:  Large)   Pulse (!) 56   Resp 16   Ht 5\' 3"  (1.6 m)   Wt 176 lb 6.4 oz (80 kg)   SpO2 97%   BMI 31.25 kg/m    Physical Exam  Constitutional: She is oriented to person, place, and time. She appears well-developed and well-nourished. No distress.  HENT:  Head: Normocephalic and atraumatic.  Mouth/Throat: Oropharynx is clear and moist. No oropharyngeal exudate.  Eyes: Pupils are equal, round, and reactive to light. EOM are normal.  Neck: Normal range of motion. Neck supple. No JVD present. No tracheal deviation present. No thyromegaly present.  Cardiovascular: Normal rate, regular rhythm and normal heart sounds. Exam reveals no gallop and no friction rub.  No murmur heard. Pulmonary/Chest: Effort normal and breath sounds normal. No respiratory distress. She has no wheezes. She has no rales. She exhibits no tenderness.  Abdominal: Soft. There is no tenderness. There is no guarding.  Musculoskeletal: Normal range of motion.  Lymphadenopathy:    She has no cervical adenopathy.  Neurological: She is alert and oriented to person, place, and time. No cranial nerve deficit.  Skin: Skin is warm and dry. She is not diaphoretic.  Psychiatric: She has a normal mood and affect. Her behavior is normal. Judgment and thought content normal.  Nursing note and vitals  reviewed.   Assessment/Plan: 1. Pain in the coccyx Patient requesting x-ray to see if she has refractured her coccyx.  Evaluate x-ray as available. - DG Sacrum/Coccyx; Future  2. Pure hypercholesterolemia Cholesterol appears stable.  Continue atorvastatin as prescribed. - atorvastatin (LIPITOR) 10 MG tablet; TAKE 1 TABLET (10 MG TOTAL) BY MOUTH DAILY.  Dispense: 90 tablet; Refill: 1  3. Enlarged thyroid Discussed patient's thyroid ultrasound results with her.  She still feels like her thyroid is enlarged however ultrasound reveals a thyroid is within normal limits.  We will continue to follow in the future.  General Counseling: fate galanti understanding of the findings of todays visit and agrees with plan of treatment. I have discussed any further diagnostic evaluation that may be needed or ordered today. We also reviewed her medications today. she has been encouraged to call the office with any questions or concerns that should arise related to todays visit.    Orders Placed This Encounter  Procedures  . DG Sacrum/Coccyx    Meds ordered this encounter  Medications  . atorvastatin (LIPITOR) 10 MG tablet    Sig: TAKE 1 TABLET (10 MG TOTAL) BY MOUTH DAILY.    Dispense:  90 tablet    Refill:  1    2nd NOTIFICATION:  PATIENT MUST MAKE APPT FOR PHYSICAL EXAM PRIOR TO FURTHER REFILLS.    Time spent:25 Minutes   This patient was seen by Orson Gear AGNP-C in Collaboration with Dr Lavera Guise as a part of collaborative care agreement     Kendell Bane AGNP-C Internal medicine

## 2018-04-04 ENCOUNTER — Ambulatory Visit
Admission: RE | Admit: 2018-04-04 | Discharge: 2018-04-04 | Disposition: A | Discharge status: Home / Self Care | Payer: Medicare HMO | Source: Ambulatory Visit | Attending: Adult Health | Admitting: Adult Health

## 2018-04-04 DIAGNOSIS — M533 Sacrococcygeal disorders, not elsewhere classified: Secondary | ICD-10-CM

## 2018-04-05 ENCOUNTER — Telehealth: Payer: Self-pay | Admitting: Adult Health

## 2018-04-05 NOTE — Telephone Encounter (Signed)
Left message for patient regarding xray,

## 2018-04-06 ENCOUNTER — Ambulatory Visit (INDEPENDENT_AMBULATORY_CARE_PROVIDER_SITE_OTHER): Payer: Medicare HMO | Admitting: Nurse Practitioner

## 2018-04-06 ENCOUNTER — Encounter: Payer: Self-pay | Admitting: Nurse Practitioner

## 2018-04-06 VITALS — BP 141/66 | HR 54 | Temp 96.7°F | Resp 16 | Ht 63.0 in | Wt 177.0 lb

## 2018-04-06 DIAGNOSIS — M545 Low back pain, unspecified: Secondary | ICD-10-CM

## 2018-04-06 DIAGNOSIS — M5418 Radiculopathy, sacral and sacrococcygeal region: Secondary | ICD-10-CM | POA: Insufficient documentation

## 2018-04-06 DIAGNOSIS — M533 Sacrococcygeal disorders, not elsewhere classified: Secondary | ICD-10-CM

## 2018-04-06 MED ORDER — MELOXICAM 7.5 MG PO TABS: 7.5000 mg | ORAL_TABLET | Freq: Two times a day (BID) | ORAL | 1 refills | Status: DC | PRN

## 2018-04-06 NOTE — Progress Notes (Signed)
Stacey Boyd, Stacey Boyd [id number]  Internal MEDICINE  Office Visit Note  Patient Name: Stacey Boyd  [id number]  [id number]  Date of Service: 04/06/2018  Chief Complaint  Patient presents with  . Hypertension  . Hyperlipidemia  . Back Pain    pain in tailbone     The patient is c/o pain in tailbone. She fell about 6 months ago. Did have x-ray which was negative. She feel again about 4 months ago, re injuring the same area. Has not really gotten better since then. Really hard for her to sit for longer than 5 minutes because pain is so severe. Standing up from a seated position also increases pain. When the patn is present, the pain is rated as a 9/10. When she is standing up from a seated position, the pain gets worse, and she rates that as 10/10 in severity. She is not taking anything for pain. She does have a donut she sits on, but this still causes increased pressure in tailbone area. Pain radiates into her right hip as well. The patient had x-ray of the sacrum on 04/04/2018 and this was negative for any acute abnormalities.       Current Medication: Outpatient Encounter Medications as of 04/06/2018  Medication Sig  . atorvastatin (LIPITOR) 10 MG tablet TAKE 1 TABLET (10 MG TOTAL) BY MOUTH DAILY.  . brinzolamide (AZOPT) 1 % ophthalmic suspension Place 1 drop into both eyes 2 (two) times daily.  . Cholecalciferol (VITAMIN D3) 5000 units TABS Take 5,000 Units by mouth daily.   . fluticasone (FLONASE) 50 MCG/ACT nasal spray Place 2 sprays into both nostrils at bedtime.  . haloperidol (HALDOL) 5 MG tablet Take 0.5 tablets (2.5 mg total) by mouth at bedtime.  Marland Kitchen ibuprofen (ADVIL,MOTRIN) 200 MG tablet Take 400 mg by mouth every 6 (six) hours as needed for pain. Reported on 09/17/2015  . latanoprost (XALATAN) 0.005 % ophthalmic solution 1 drop nightly.  . levothyroxine (SYNTHROID, LEVOTHROID) 25 MCG tablet Take 1 tablet (25 mcg total) by mouth daily before  breakfast.  . lisinopril-hydrochlorothiazide (PRINZIDE,ZESTORETIC) 20-12.5 MG tablet Take 1 tablet by mouth daily. MUST SCHEDULE ANNUAL EXAM FOR FURTHER REFILLS  . loratadine (CLARITIN) 10 MG tablet Take 1 tablet (10 mg total) by mouth daily.  . Multiple Vitamins-Minerals (PRESERVISION AREDS 2) CAPS Take 1 capsule by mouth daily.  . propranolol (INDERAL) 40 MG tablet Take 0.5 tablets (20 mg total) by mouth 2 (two) times daily.  Marland Kitchen rOPINIRole (REQUIP) 2 MG tablet TAKE 1 TABLET (2 MG TOTAL) BY MOUTH NIGHTLY.  . timolol (TIMOPTIC) 0.5 % ophthalmic solution INSTILL ONE DROP INTO BOTH EYES DAILY  . valACYclovir (VALTREX) 500 MG tablet TAKE 1 TABLET DAILYFOR COLD SORES 1 TAB TWICE A DAY FOR 3 DAYS IF OUTBREAK THEN BACK TO 1 TAB DAILY  . zolpidem (AMBIEN) 5 MG tablet Take 1 tablet (5 mg total) by mouth at bedtime as needed for sleep.  . meloxicam (MOBIC) 7.5 MG tablet Take 1 tablet (7.5 mg total) by mouth 2 (two) times daily as needed for pain.   No facility-administered encounter medications on file as of 04/06/2018.     Surgical History: Past Surgical History:  Procedure Laterality Date  . ABDOMINAL HYSTERECTOMY  2005  . APPENDECTOMY  1980  . CARDIAC CATHETERIZATION Left 09/17/2015   Procedure: Left Heart Cath and Coronary Angiography;  Surgeon: Teodoro Spray, MD;  Location: [place];  Service: Cardiovascular;  Laterality: Left;  .  COLONOSCOPY WITH PROPOFOL N/A 11/07/2017   Procedure: COLONOSCOPY WITH PROPOFOL;  Surgeon: Jonathon Bellows, MD;  Location: [facility] ENDOSCOPY;  Service: Gastroenterology;  Laterality: N/A;  . FRACTURE SURGERY      Medical History: Past Medical History:  Diagnosis Date  . Allergy   . Arthritis   . Cancer ([place])    skin cancer on right shoulder.   . Diabetes mellitus without complication ([place])    Pt states she is diet controlled.   . Glaucoma   . Hyperlipidemia   . Hypertension   . Thyroid disease   . Wrist fracture 2001   right    Family  History: Family History  Problem Relation Age of Onset  . Heart disease Mother        h/o rheumatic fever  . Heart disease Father   . Stroke Father   . Diabetes Sister   . Diabetes Brother   . Diabetes Sister   . Mental illness Other   . Colon cancer Neg Hx   . Breast cancer Neg Hx     Social History   Socioeconomic History  . Marital status: Divorced    Spouse name: Not on file  . Number of children: 3  . Years of education: Not on file  . Highest education level: 10th grade  Occupational History  . Not on file  Social Needs  . Financial resource strain: Not hard at all  . Food insecurity:    Worry: Never true    Inability: Never true  . Transportation needs:    Medical: No    Non-medical: No  Tobacco Use  . Smoking status: Former Smoker    Last attempt to quit: 09/17/1979    Years since quitting: 38.5  . Smokeless tobacco: Never Used  Substance and Sexual Activity  . Alcohol use: No  . Drug use: No  . Sexual activity: Yes    Partners: Male    Birth control/protection: None  Lifestyle  . Physical activity:    Days per week: 0 days    Minutes per session: 0 min  . Stress: Not at all  Relationships  . Social connections:    Talks on phone: Not on file    Gets together: Not on file    Attends religious service: 1 to 4 times per year    Active member of club or organization: No    Attends meetings of clubs or organizations: Never    Relationship status: Divorced  . Intimate partner violence:    Fear of current or ex partner: No    Emotionally abused: No    Physically abused: No    Forced sexual activity: No  Other Topics Concern  . Not on file  Social History Narrative   3 kids, local   Lives with daughter as of 2016   Divorced ~2002      Review of Systems  Constitutional: Positive for activity change. Negative for chills, fatigue and unexpected weight change.  HENT: Negative for congestion, postnasal drip, rhinorrhea, sneezing and sore throat.    Respiratory: Negative for cough, chest tightness and shortness of breath.   Cardiovascular: Negative for chest pain and palpitations.  Gastrointestinal: Negative for abdominal pain, constipation, diarrhea, nausea and vomiting.  Genitourinary: Negative for dysuria and frequency.  Musculoskeletal: Positive for arthralgias and back pain. Negative for joint swelling and neck pain.       The patient has significant pain in tailbone, rates 9/10 in severity. Negative x-ra done 04/04/2018  Skin:  Negative for rash.  Neurological: Negative.  Negative for tremors and numbness.  Hematological: Negative for adenopathy. Does not bruise/bleed easily.  Psychiatric/Behavioral: Negative for behavioral problems (Depression), sleep disturbance and suicidal ideas. The patient is nervous/anxious.     Vital Signs: BP (!) 141/66   Pulse (!) 54   Temp (!) 96.7 F (35.9 C)   Resp 16   Ht 5\' 3"  (1.6 m)   Wt 177 lb (80.3 kg)   SpO2 94%   BMI 31.35 kg/m    Physical Exam  Constitutional: She is oriented to person, place, and time. She appears well-developed and well-nourished. No distress.  HENT:  Head: Normocephalic and atraumatic.  Mouth/Throat: No oropharyngeal exudate.  Eyes: Pupils are equal, round, and reactive to light. EOM are normal.  Neck: Normal range of motion. Neck supple. No JVD present. No tracheal deviation present. No thyromegaly present.  Cardiovascular: Normal rate, regular rhythm and normal heart sounds. Exam reveals no gallop and no friction rub.  No murmur heard. Pulmonary/Chest: Effort normal and breath sounds normal. No respiratory distress. She has no wheezes. She has no rales. She exhibits no tenderness.  Abdominal: There is no guarding.  Musculoskeletal: Normal range of motion.       Back:  Lymphadenopathy:    She has no cervical adenopathy.  Neurological: She is alert and oriented to person, place, and time. No cranial nerve deficit.  Skin: Skin is warm and dry. She is not  diaphoretic.  Psychiatric: She has a normal mood and affect. Her behavior is normal. Judgment and thought content normal.  Nursing note and vitals reviewed.  Assessment/Plan: 1. Pain in the coccyx Add meloxicam 7.5mg  tablets. May take twice daily to help reduce pain and inflammation. Wean down to once daily as tolerated.  - meloxicam (MOBIC) 7.5 MG tablet; Take 1 tablet (7.5 mg total) by mouth 2 (two) times daily as needed for pain.  Dispense: 45 tablet; Refill: 1  2. Radiculopathy, sacral and sacrococcygeal region Negative x-ray of saccrum and coccyx. Will get MRI for further evaluation. Refer out as indicated.  - MR SACRUM SI JOINTS W WO CONTRAST; Future  3. Acute midline low back pain without sciatica Add meloxicam 7.5mg  tablets. May take twice daily to help reduce pain and inflammation. Wean down to once daily as tolerated.  - meloxicam (MOBIC) 7.5 MG tablet; Take 1 tablet (7.5 mg total) by mouth 2 (two) times daily as needed for pain.  Dispense: 45 tablet; Refill: 1  General Counseling: Chayce verbalizes understanding of the findings of todays visit and agrees with plan of treatment. I have discussed any further diagnostic evaluation that may be needed or ordered today. We also reviewed her medications today. she has been encouraged to call the office with any questions or concerns that should arise related to todays visit.  Apply a compressive ACE bandage. Rest and elevate the affected painful area.  Apply cold compresses intermittently as needed.  As pain recedes, begin normal activities slowly as tolerated.  Call if symptoms persist.  .This patient was seen by Leretha Pol FNP Collaboration with Dr Lavera Guise as a part of collaborative care agreement  Orders Placed This Encounter  Procedures  . MR SACRUM SI JOINTS W WO CONTRAST    Meds ordered this encounter  Medications  . meloxicam (MOBIC) 7.5 MG tablet    Sig: Take 1 tablet (7.5 mg total) by mouth 2 (two) times daily as  needed for pain.    Dispense:  45 tablet  Refill:  1    Order Specific Question:   Supervising Provider    Answer:   Lavera Guise [[id number]]    Time spent: 36 Minutes      Dr Lavera Guise Internal medicine

## 2018-04-07 ENCOUNTER — Emergency Department: Payer: Medicare HMO

## 2018-04-07 ENCOUNTER — Other Ambulatory Visit: Payer: Self-pay

## 2018-04-07 ENCOUNTER — Emergency Department
Admission: EM | Admit: 2018-04-07 | Discharge: 2018-04-07 | Disposition: A | Discharge status: Home / Self Care | Payer: Medicare HMO | Attending: Student in an Organized Health Care Education/Training Program | Admitting: Student in an Organized Health Care Education/Training Program

## 2018-04-07 ENCOUNTER — Encounter: Payer: Self-pay | Admitting: Emergency Medicine

## 2018-04-07 DIAGNOSIS — Z87891 Personal history of nicotine dependence: Secondary | ICD-10-CM | POA: Diagnosis not present

## 2018-04-07 DIAGNOSIS — E119 Type 2 diabetes mellitus without complications: Secondary | ICD-10-CM | POA: Insufficient documentation

## 2018-04-07 DIAGNOSIS — Z79899 Other long term (current) drug therapy: Secondary | ICD-10-CM | POA: Diagnosis not present

## 2018-04-07 DIAGNOSIS — E039 Hypothyroidism, unspecified: Secondary | ICD-10-CM | POA: Insufficient documentation

## 2018-04-07 DIAGNOSIS — I10 Essential (primary) hypertension: Secondary | ICD-10-CM | POA: Insufficient documentation

## 2018-04-07 DIAGNOSIS — G2401 Drug induced subacute dyskinesia: Secondary | ICD-10-CM

## 2018-04-07 DIAGNOSIS — R251 Tremor, unspecified: Secondary | ICD-10-CM | POA: Insufficient documentation

## 2018-04-07 DIAGNOSIS — F22 Delusional disorders: Secondary | ICD-10-CM | POA: Diagnosis present

## 2018-04-07 LAB — COMPREHENSIVE METABOLIC PANEL
ALT: 21 U/L (ref 0–44)
AST: 19 U/L (ref 15–41)
Albumin: 4 g/dL (ref 3.5–5.0)
Alkaline Phosphatase: 69 U/L (ref 38–126)
Anion gap: 8 (ref 5–15)
BILIRUBIN TOTAL: 0.7 mg/dL (ref 0.3–1.2)
BUN: 29 mg/dL — AB (ref 8–23)
CHLORIDE: 105 mmol/L (ref 98–111)
CO2: 27 mmol/L (ref 22–32)
Calcium: 9.2 mg/dL (ref 8.9–10.3)
Creatinine, Ser: 0.96 mg/dL (ref 0.44–1.00)
GFR calc non Af Amer: 57 mL/min — ABNORMAL LOW (ref >60)
Glucose, Bld: 178 mg/dL — ABNORMAL HIGH (ref 70–99)
POTASSIUM: 4.2 mmol/L (ref 3.5–5.1)
Sodium: 140 mmol/L (ref 135–145)
TOTAL PROTEIN: 7 g/dL (ref 6.5–8.1)

## 2018-04-07 LAB — CBC WITH DIFFERENTIAL/PLATELET
Abs Immature Granulocytes: 0.02 10*3/uL (ref 0.00–0.07)
BASOS PCT: 1 %
Basophils Absolute: 0.1 10*3/uL (ref 0.0–0.1)
EOS ABS: 0.1 10*3/uL (ref 0.0–0.5)
EOS PCT: 2 %
HEMATOCRIT: 39.4 % (ref 36.0–46.0)
Hemoglobin: 13.2 g/dL (ref 12.0–15.0)
Immature Granulocytes: 0 %
LYMPHS ABS: 2 10*3/uL (ref 0.7–4.0)
Lymphocytes Relative: 35 %
MCH: 30.9 pg (ref 26.0–34.0)
MCHC: 33.5 g/dL (ref 30.0–36.0)
MCV: 92.3 fL (ref 80.0–100.0)
MONO ABS: 0.5 10*3/uL (ref 0.1–1.0)
MONOS PCT: 8 %
NEUTROS PCT: 54 %
Neutro Abs: 3.1 10*3/uL (ref 1.7–7.7)
PLATELETS: 270 10*3/uL (ref 150–400)
RBC: 4.27 MIL/uL (ref 3.87–5.11)
RDW: 12.1 % (ref 11.5–15.5)
WBC: 5.8 10*3/uL (ref 4.0–10.5)
nRBC: 0 % (ref 0.0–0.2)

## 2018-04-07 LAB — TROPONIN I

## 2018-04-07 MED ORDER — ACETAMINOPHEN 500 MG PO TABS
1000.0000 mg | ORAL_TABLET | Freq: Once | ORAL | Status: AC
  Administered 2018-04-07: 1000 mg via ORAL
  Filled 2018-04-07: qty 2

## 2018-04-07 MED ORDER — QUETIAPINE FUMARATE 100 MG PO TABS: 100.0000 mg | ORAL_TABLET | Freq: Every day | ORAL | 0 refills | Status: DC

## 2018-04-07 NOTE — Consult Note (Signed)
[facility] Face-to-Face Psychiatry Consult   Reason for Consult: Consult for this 72 year old woman who came to the emergency room because of concern about abnormal movements in her face Referring Physician: Quentin Cornwall Patient Identification: Stacey Boyd MRN:  [id number] Principal Diagnosis: Tardive dyskinesia Diagnosis:  Principal Problem:   Tardive dyskinesia Active Problems:   Delusional disorder ([place])   Total Time spent with patient: 1 hour  Subjective:   Stacey Boyd is a 72 y.o. female patient admitted with "I have a tremor in my tongue".  HPI: Patient seen chart reviewed.  Case reviewed with emergency room doctor.  This 72 year old woman came to the emergency room complaining of a tremor in her mouth.  She tells me that she first noticed it a little over a week ago.  Subsequently her boyfriend has noticed it as well.  He can feel her tongue moving abnormally when she is not controlling it.  Also has started to notice abnormal twitching in her lips.  She denies being aware of any abnormal movements anywhere else in her body.  Patient states her mood has been fine.  Denies depression.  Denies any sense of hallucinations.  Not having any auditory hallucinations or any beliefs that people next door are doing anything to harass her.  Sleeping adequately.  Not drinking or using any drugs.  Patient has continued to be compliant with Haldol 2.5 mg at night as prescribed from last time she was in the hospital and has continued to see an outpatient psychiatrist.  Social history: Patient lives with her female companion.  They seem to have a very good relationship.  Medical history: History of skin cancer diabetes arthritis and now it looks like probably TD.  Substance abuse history: No alcohol or drug abuse  Past Psychiatric History: Patient has had a previous hospitalization and some treatment for psychotic symptoms that consisted of auditory hallucinations of music and paranoid beliefs that people  were harassing her through the walls of her apartment.  Patient does not have a history of suicide attempts or violence.  Showed good response with modest doses of antipsychotics.  Risk to Self:   Risk to Others:   Prior Inpatient Therapy:   Prior Outpatient Therapy:    Past Medical History:  Past Medical History:  Diagnosis Date  . Allergy   . Arthritis   . Cancer ([place])    skin cancer on right shoulder.   . Diabetes mellitus without complication ([place])    Pt states she is diet controlled.   . Glaucoma   . Hyperlipidemia   . Hypertension   . Thyroid disease   . Wrist fracture 2001   right    Past Surgical History:  Procedure Laterality Date  . ABDOMINAL HYSTERECTOMY  2005  . APPENDECTOMY  1980  . CARDIAC CATHETERIZATION Left 09/17/2015   Procedure: Left Heart Cath and Coronary Angiography;  Surgeon: Teodoro Spray, MD;  Location: [facility];  Service: Cardiovascular;  Laterality: Left;  . COLONOSCOPY WITH PROPOFOL N/A 11/07/2017   Procedure: COLONOSCOPY WITH PROPOFOL;  Surgeon: Jonathon Bellows, MD;  Location: [facility];  Service: Gastroenterology;  Laterality: N/A;  . FRACTURE SURGERY     Family History:  Family History  Problem Relation Age of Onset  . Heart disease Mother        h/o rheumatic fever  . Heart disease Father   . Stroke Father   . Diabetes Sister   . Diabetes Brother   . Diabetes Sister   .  Mental illness Other   . Colon cancer Neg Hx   . Breast cancer Neg Hx    Family Psychiatric  History: None known Social History:  Social History   Substance and Sexual Activity  Alcohol Use No     Social History   Substance and Sexual Activity  Drug Use No    Social History   Socioeconomic History  . Marital status: Divorced    Spouse name: Not on file  . Number of children: 3  . Years of education: Not on file  . Highest education level: 10th grade  Occupational History  . Not on file  Social Needs  . Financial resource strain: Not hard  at all  . Food insecurity:    Worry: Never true    Inability: Never true  . Transportation needs:    Medical: No    Non-medical: No  Tobacco Use  . Smoking status: Former Smoker    Last attempt to quit: 09/17/1979    Years since quitting: 38.5  . Smokeless tobacco: Never Used  Substance and Sexual Activity  . Alcohol use: No  . Drug use: No  . Sexual activity: Yes    Partners: Male    Birth control/protection: None  Lifestyle  . Physical activity:    Days per week: 0 days    Minutes per session: 0 min  . Stress: Not at all  Relationships  . Social connections:    Talks on phone: Not on file    Gets together: Not on file    Attends religious service: 1 to 4 times per year    Active member of club or organization: No    Attends meetings of clubs or organizations: Never    Relationship status: Divorced  Other Topics Concern  . Not on file  Social History Narrative   3 kids, local   Lives with daughter as of 2016   Divorced ~2002   Additional Social History:    Allergies:   Allergies  Allergen Reactions  . Codeine Hives and Nausea And Vomiting    Labs:  Results for orders placed or performed during the hospital encounter of 04/07/18 (from the past 48 hour(s))  CBC with Differential/Platelet     Status: None   Collection Time: 04/07/18 11:18 AM  Result Value Ref Range   WBC 5.8 4.0 - 10.5 K/uL   RBC 4.27 3.87 - 5.11 MIL/uL   Hemoglobin 13.2 12.0 - 15.0 g/dL   HCT 39.4 36.0 - 46.0 %   MCV 92.3 80.0 - 100.0 fL   MCH 30.9 26.0 - 34.0 pg   MCHC 33.5 30.0 - 36.0 g/dL   RDW 12.1 11.5 - 15.5 %   Platelets 270 150 - 400 K/uL   nRBC 0.0 0.0 - 0.2 %   Neutrophils Relative % 54 %   Neutro Abs 3.1 1.7 - 7.7 K/uL   Lymphocytes Relative 35 %   Lymphs Abs 2.0 0.7 - 4.0 K/uL   Monocytes Relative 8 %   Monocytes Absolute 0.5 0.1 - 1.0 K/uL   Eosinophils Relative 2 %   Eosinophils Absolute 0.1 0.0 - 0.5 K/uL   Basophils Relative 1 %   Basophils Absolute 0.1 0.0 - 0.1  K/uL   Immature Granulocytes 0 %   Abs Immature Granulocytes 0.02 0.00 - 0.07 K/uL    Comment: Performed at [facility], [address] [place], [place] [address]  Comprehensive metabolic panel     Status: Abnormal   Collection  Time: 04/07/18 11:18 AM  Result Value Ref Range   Sodium 140 135 - 145 mmol/L   Potassium 4.2 3.5 - 5.1 mmol/L   Chloride 105 98 - 111 mmol/L   CO2 27 22 - 32 mmol/L   Glucose, Bld 178 (H) 70 - 99 mg/dL   BUN 29 (H) 8 - 23 mg/dL   Creatinine, Ser 0.96 0.44 - 1.00 mg/dL   Calcium 9.2 8.9 - 10.3 mg/dL   Total Protein 7.0 6.5 - 8.1 g/dL   Albumin 4.0 3.5 - 5.0 g/dL   AST 19 15 - 41 U/L   ALT 21 0 - 44 U/L   Alkaline Phosphatase 69 38 - 126 U/L   Total Bilirubin 0.7 0.3 - 1.2 mg/dL   GFR calc non Af Amer 57 (L) >60 mL/min   GFR calc Af Amer >60 >60 mL/min    Comment: (NOTE) The eGFR has been calculated using the CKD EPI equation. This calculation has not been validated in all clinical situations. eGFR's persistently <60 mL/min signify possible Chronic Kidney Disease.    Anion gap 8 5 - 15    Comment: Performed at [facility], [address] [place], [place] [address]  Troponin I - ONCE - STAT     Status: None   Collection Time: 04/07/18 11:18 AM  Result Value Ref Range   Troponin I <0.03 <0.03 ng/mL    Comment: Performed at [facility], [address] [place], [place] [address]    No current facility-administered medications for this encounter.    Current Outpatient Medications  Medication Sig Dispense Refill  . atorvastatin (LIPITOR) 10 MG tablet TAKE 1 TABLET (10 MG TOTAL) BY MOUTH DAILY. 90 tablet 1  . brinzolamide (AZOPT) 1 % ophthalmic suspension Place 1 drop into both eyes 2 (two) times daily.    . Cholecalciferol (VITAMIN D3) 5000 units TABS Take 5,000 Units by mouth daily.     . fluticasone (FLONASE) 50 MCG/ACT nasal spray Place 2 sprays into both nostrils at bedtime. 16 g 2  . ibuprofen (ADVIL,MOTRIN) 200  MG tablet Take 400 mg by mouth every 6 (six) hours as needed for pain. Reported on 09/17/2015    . latanoprost (XALATAN) 0.005 % ophthalmic solution 1 drop nightly.    . levothyroxine (SYNTHROID, LEVOTHROID) 25 MCG tablet Take 1 tablet (25 mcg total) by mouth daily before breakfast. 30 tablet 3  . lisinopril-hydrochlorothiazide (PRINZIDE,ZESTORETIC) 20-12.5 MG tablet Take 1 tablet by mouth daily. MUST SCHEDULE ANNUAL EXAM FOR FURTHER REFILLS 90 tablet 0  . loratadine (CLARITIN) 10 MG tablet Take 1 tablet (10 mg total) by mouth daily. 30 tablet 1  . meloxicam (MOBIC) 7.5 MG tablet Take 1 tablet (7.5 mg total) by mouth 2 (two) times daily as needed for pain. 45 tablet 1  . Multiple Vitamins-Minerals (PRESERVISION AREDS 2) CAPS Take 1 capsule by mouth daily.    . propranolol (INDERAL) 40 MG tablet Take 0.5 tablets (20 mg total) by mouth 2 (two) times daily. 60 tablet 1  . QUEtiapine (SEROQUEL) 100 MG tablet Take 1 tablet (100 mg total) by mouth at bedtime. 30 tablet 0  . rOPINIRole (REQUIP) 2 MG tablet TAKE 1 TABLET (2 MG TOTAL) BY MOUTH NIGHTLY. 90 tablet 1  . timolol (TIMOPTIC) 0.5 % ophthalmic solution INSTILL ONE DROP INTO BOTH EYES DAILY  3  . valACYclovir (VALTREX) 500 MG tablet TAKE 1 TABLET DAILYFOR COLD SORES 1 TAB TWICE A DAY FOR 3 DAYS IF OUTBREAK THEN BACK TO  1 TAB DAILY  11  . zolpidem (AMBIEN) 5 MG tablet Take 1 tablet (5 mg total) by mouth at bedtime as needed for sleep. 30 tablet 3    Musculoskeletal: Strength & Muscle Tone: within normal limits Gait & Station: normal Patient leans: N/A  Psychiatric Specialty Exam: Physical Exam  Nursing note and vitals reviewed. Constitutional: She appears well-developed and well-nourished.  HENT:  Head: Normocephalic and atraumatic.  Eyes: Pupils are equal, round, and reactive to light. Conjunctivae are normal.  Neck: Normal range of motion.  Cardiovascular: Regular rhythm and normal heart sounds.  Respiratory: Effort normal. No respiratory  distress.  GI: Soft.  Musculoskeletal: Normal range of motion.  Neurological: She is alert. She displays tremor.  Patient has a tremor in her orofacial area quite typical of tardive dyskinesia  Skin: Skin is warm and dry.  Psychiatric: She has a normal mood and affect. Her speech is normal and behavior is normal. Judgment and thought content normal. Cognition and memory are normal.    Review of Systems  Constitutional: Negative.   HENT: Negative.   Eyes: Negative.   Respiratory: Negative.   Cardiovascular: Negative.   Gastrointestinal: Negative.   Musculoskeletal: Negative.   Skin: Negative.   Neurological: Positive for tremors.  Psychiatric/Behavioral: Negative.     Blood pressure (!) 146/63, pulse (!) 52, temperature 98.1 F (36.7 C), temperature source Oral, resp. rate 10, height '5\' 3"'  (1.6 m), weight 80.3 kg, SpO2 95 %.Body mass index is 31.35 kg/m.  General Appearance: Fairly Groomed  Eye Contact:  Fair  Speech:  Clear and Coherent  Volume:  Normal  Mood:  Euthymic  Affect:  Congruent  Thought Process:  Goal Directed  Orientation:  Full (Time, Place, and Person)  Thought Content:  Logical  Suicidal Thoughts:  No  Homicidal Thoughts:  No  Memory:  Immediate;   Good Recent;   Fair Remote;   Fair  Judgement:  Fair  Insight:  Fair  Psychomotor Activity:  TD  Concentration:  Concentration: Fair  Recall:  [employer] of Knowledge:  Fair  Language:  Fair  Akathisia:  No  Handed:  Right  AIMS (if indicated):     Assets:  Desire for Improvement Housing Physical Health Resilience Social Support  ADL's:  Intact  Cognition:  WNL  Sleep:        Treatment Plan Summary: Medication management and Plan 72 year old woman presents to the hospital with complaints of abnormal movements in her mouth.  During the interview I watched her during speech and when she was relaxed.  Patient did not appear to be making any attempts to move her mouth abnormally.  She had clear  twitching and vibrating in her lips and some choreatic movements in her tongue.  Tongue did not protrude out of the mouth and the appearance was not dramatic but it seemed pretty clearly to be typical of tardive dyskinesia.  She did not appear to be having abnormal movements in her hands or anywhere else.  Patient psychiatric symptoms appear to be very well controlled.  I did psychoeducation with the patient and her friend about tardive dyskinesia.  Explained that this was likely related to the haloperidol and that the best way to prevent it getting worse is to stop the Haldol as soon as possible.  Discussed with them also however the risk of a return of psychotic symptoms.  I suggested that we try replacing the Haldol with 100 mg of Seroquel at night as this drug has  a lesser risk of tardive dyskinesia.  Meanwhile I am going to send a message to the patient's outpatient psychiatrist and instruct the patient to call her psychiatrist on Monday for follow-up instructions and management.  Patient agreeable to plan.  Case reviewed with ER doctor.  Prescription written.  Disposition: No evidence of imminent risk to self or others at present.   Patient does not meet criteria for psychiatric inpatient admission. Supportive therapy provided about ongoing stressors.  Alethia Berthold, MD 04/07/2018 7:39 PM

## 2018-04-07 NOTE — ED Notes (Signed)
FIRST NURSE NOTE:  Pt here with family member, reports noticing pt shaking in chin this morning, pt denies any pain at this time, ambulatory in lobby.

## 2018-04-07 NOTE — ED Provider Notes (Signed)
[facility] Emergency Department Provider Note    First MD Initiated Contact with Patient 04/07/18 1106     (approximate)  I have reviewed the triage vital signs and the nursing notes.   HISTORY  Chief Complaint Tremors    HPI Stacey Boyd is a 72 y.o. female below listed pmh p/w chief complaint of tremor to chin and tongue that started several days ago.  Is never had issues like this before.  Does have family history of tremor.  No recent medication changes.  Not previously on any benzos or sudden cessation of medication.  Denies any headache.  No nausea or vomiting.  Does have some left arm achiness but denies any chest pain or shortness of breath.    Past Medical History:  Diagnosis Date  . Allergy   . Arthritis   . Cancer ([place])    skin cancer on right shoulder.   . Diabetes mellitus without complication ([place])    Pt states she is diet controlled.   . Glaucoma   . Hyperlipidemia   . Hypertension   . Thyroid disease   . Wrist fracture 2001   right   Family History  Problem Relation Age of Onset  . Heart disease Mother        h/o rheumatic fever  . Heart disease Father   . Stroke Father   . Diabetes Sister   . Diabetes Brother   . Diabetes Sister   . Mental illness Other   . Colon cancer Neg Hx   . Breast cancer Neg Hx    Past Surgical History:  Procedure Laterality Date  . ABDOMINAL HYSTERECTOMY  2005  . APPENDECTOMY  1980  . CARDIAC CATHETERIZATION Left 09/17/2015   Procedure: Left Heart Cath and Coronary Angiography;  Surgeon: Teodoro Spray, MD;  Location: [place] CV LAB;  Service: Cardiovascular;  Laterality: Left;  . COLONOSCOPY WITH PROPOFOL N/A 11/07/2017   Procedure: COLONOSCOPY WITH PROPOFOL;  Surgeon: Jonathon Bellows, MD;  Location: [facility] ENDOSCOPY;  Service: Gastroenterology;  Laterality: N/A;  . FRACTURE SURGERY     Patient Active Problem List   Diagnosis Date Noted  . Radiculopathy, sacral and sacrococcygeal region  04/06/2018  . Uncontrolled type 2 diabetes mellitus with hyperglycemia ([place]) 02/10/2018  . Enlarged thyroid 02/10/2018  . Needs flu shot 02/10/2018  . Acquired hypothyroidism 12/29/2017  . Peripheral polyneuropathy 12/04/2017  . Other fatigue 12/04/2017  . Auditory hallucination 12/04/2017  . Primary insomnia 12/04/2017  . Delusional disorder ([place]) 10/10/2017  . Acute midline low back pain without sciatica 09/09/2017  . Sacral bruising, initial encounter 09/09/2017  . Neoplasm of uncertain behavior of skin 09/09/2017  . Impaired fasting glucose 09/09/2017  . Vitamin D deficiency 09/09/2017  . Diverticulitis of large intestine without bleeding 01/07/2016  . Type 2 diabetes mellitus with stage 3 chronic kidney disease ([place]) 02/18/2015  . HTN, goal below 140/90 02/18/2015  . Osteopenia 11/15/2014  . Screening for breast cancer 09/24/2014  . Elevated serum creatinine 09/24/2014  . Pain in the coccyx 09/20/2013  . Routine general medical examination at a health care facility 09/20/2013  . Hyperglycemia 03/15/2013  . Essential hypertension, benign 03/15/2013  . Pure hypercholesterolemia 03/15/2013  . OA (osteoarthritis) 03/15/2013  . Glaucoma 03/15/2013      Prior to Admission medications   Medication Sig Start Date End Date Taking? Authorizing Provider  atorvastatin (LIPITOR) 10 MG tablet TAKE 1 TABLET (10 MG TOTAL) BY MOUTH DAILY. 03/27/18   Orson Gear  J, NP  brinzolamide (AZOPT) 1 % ophthalmic suspension Place 1 drop into both eyes 2 (two) times daily.    [provider]  Cholecalciferol (VITAMIN D3) 5000 units TABS Take 5,000 Units by mouth daily.     [provider]  fluticasone (FLONASE) 50 MCG/ACT nasal spray Place 2 sprays into both nostrils at bedtime. 10/15/17   Pucilowska, Jolanta B, MD  ibuprofen (ADVIL,MOTRIN) 200 MG tablet Take 400 mg by mouth every 6 (six) hours as needed for pain. Reported on 09/17/2015    [provider]  latanoprost  (XALATAN) 0.005 % ophthalmic solution 1 drop nightly.    [provider]  levothyroxine (SYNTHROID, LEVOTHROID) 25 MCG tablet Take 1 tablet (25 mcg total) by mouth daily before breakfast. 02/16/18   Ronnell Freshwater, NP  lisinopril-hydrochlorothiazide (PRINZIDE,ZESTORETIC) 20-12.5 MG tablet Take 1 tablet by mouth daily. MUST SCHEDULE ANNUAL EXAM FOR FURTHER REFILLS 11/21/17   Ronnell Freshwater, NP  loratadine (CLARITIN) 10 MG tablet Take 1 tablet (10 mg total) by mouth daily. 10/16/17   Pucilowska, Herma Ard B, MD  meloxicam (MOBIC) 7.5 MG tablet Take 1 tablet (7.5 mg total) by mouth 2 (two) times daily as needed for pain. 04/06/18   Ronnell Freshwater, NP  Multiple Vitamins-Minerals (PRESERVISION AREDS 2) CAPS Take 1 capsule by mouth daily.    [provider]  propranolol (INDERAL) 40 MG tablet Take 0.5 tablets (20 mg total) by mouth 2 (two) times daily. 12/15/17   Ronnell Freshwater, NP  QUEtiapine (SEROQUEL) 100 MG tablet Take 1 tablet (100 mg total) by mouth at bedtime. 04/07/18   Clapacs, Madie Reno, MD  rOPINIRole (REQUIP) 2 MG tablet TAKE 1 TABLET (2 MG TOTAL) BY MOUTH NIGHTLY. 12/26/17   Ronnell Freshwater, NP  timolol (TIMOPTIC) 0.5 % ophthalmic solution INSTILL ONE DROP INTO BOTH EYES DAILY 05/21/17   [provider]  valACYclovir (VALTREX) 500 MG tablet TAKE 1 TABLET DAILYFOR COLD SORES 1 TAB TWICE A DAY FOR 3 DAYS IF OUTBREAK THEN BACK TO 1 TAB DAILY 10/13/17   [provider]  zolpidem (AMBIEN) 5 MG tablet Take 1 tablet (5 mg total) by mouth at bedtime as needed for sleep. 02/01/18   Ursula Alert, MD    Allergies Codeine    Social History Social History   Tobacco Use  . Smoking status: Former Smoker    Last attempt to quit: 09/17/1979    Years since quitting: 38.5  . Smokeless tobacco: Never Used  Substance Use Topics  . Alcohol use: No  . Drug use: No    Review of Systems Patient denies headaches, rhinorrhea, blurry vision, numbness, shortness of  breath, chest pain, edema, cough, abdominal pain, nausea, vomiting, diarrhea, dysuria, fevers, rashes or hallucinations unless otherwise stated above in HPI. ____________________________________________   PHYSICAL EXAM:  VITAL SIGNS: Vitals:   04/07/18 1400 04/07/18 1430  BP: (!) 145/60 (!) 146/63  Pulse: (!) 50 (!) 52  Resp: 12 10  Temp:    SpO2: 97% 95%    Constitutional: Alert and oriented.  Eyes: Conjunctivae are normal.  Head: Atraumatic. Nose: No congestion/rhinnorhea. Mouth/Throat: Mucous membranes are moist.   Neck: No stridor. Painless ROM.  Cardiovascular: Normal rate, regular rhythm. Grossly normal heart sounds.  Good peripheral circulation. Respiratory: Normal respiratory effort.  No retractions. Lungs CTAB. Gastrointestinal: Soft and nontender. No distention. No abdominal bruits. No CVA tenderness. Genitourinary:  Musculoskeletal: No lower extremity tenderness nor edema.  No joint effusions. Neurologic:  Normal speech and language.  +  tongue fasciculations and resting chin tremor. No gross focal neurologic deficits are appreciated. No facial droop Skin:  Skin is warm, dry and intact. No rash noted. Psychiatric: Mood and affect are normal. Speech and behavior are normal.  ____________________________________________   LABS (all labs ordered are listed, but only abnormal results are displayed)  Results for orders placed or performed during the hospital encounter of 04/07/18 (from the past 24 hour(s))  CBC with Differential/Platelet     Status: None   Collection Time: 04/07/18 11:18 AM  Result Value Ref Range   WBC 5.8 4.0 - 10.5 K/uL   RBC 4.27 3.87 - 5.11 MIL/uL   Hemoglobin 13.2 12.0 - 15.0 g/dL   HCT 39.4 36.0 - 46.0 %   MCV 92.3 80.0 - 100.0 fL   MCH 30.9 26.0 - 34.0 pg   MCHC 33.5 30.0 - 36.0 g/dL   RDW 12.1 11.5 - 15.5 %   Platelets 270 150 - 400 K/uL   nRBC 0.0 0.0 - 0.2 %   Neutrophils Relative % 54 %   Neutro Abs 3.1 1.7 - 7.7 K/uL   Lymphocytes  Relative 35 %   Lymphs Abs 2.0 0.7 - 4.0 K/uL   Monocytes Relative 8 %   Monocytes Absolute 0.5 0.1 - 1.0 K/uL   Eosinophils Relative 2 %   Eosinophils Absolute 0.1 0.0 - 0.5 K/uL   Basophils Relative 1 %   Basophils Absolute 0.1 0.0 - 0.1 K/uL   Immature Granulocytes 0 %   Abs Immature Granulocytes 0.02 0.00 - 0.07 K/uL  Comprehensive metabolic panel     Status: Abnormal   Collection Time: 04/07/18 11:18 AM  Result Value Ref Range   Sodium 140 135 - 145 mmol/L   Potassium 4.2 3.5 - 5.1 mmol/L   Chloride 105 98 - 111 mmol/L   CO2 27 22 - 32 mmol/L   Glucose, Bld 178 (H) 70 - 99 mg/dL   BUN 29 (H) 8 - 23 mg/dL   Creatinine, Ser 0.96 0.44 - 1.00 mg/dL   Calcium 9.2 8.9 - 10.3 mg/dL   Total Protein 7.0 6.5 - 8.1 g/dL   Albumin 4.0 3.5 - 5.0 g/dL   AST 19 15 - 41 U/L   ALT 21 0 - 44 U/L   Alkaline Phosphatase 69 38 - 126 U/L   Total Bilirubin 0.7 0.3 - 1.2 mg/dL   GFR calc non Af Amer 57 (L) >60 mL/min   GFR calc Af Amer >60 >60 mL/min   Anion gap 8 5 - 15  Troponin I - ONCE - STAT     Status: None   Collection Time: 04/07/18 11:18 AM  Result Value Ref Range   Troponin I <0.03 <0.03 ng/mL   ____________________________________________  EKG My review and personal interpretation at Time: 11:16   Indication: tremor  Rate: 55  Rhythm: sinus Axis: normal Other: normal intervals, no stemi ____________________________________________  RADIOLOGY  I personally reviewed all radiographic images ordered to evaluate for the above acute complaints and reviewed radiology reports and findings.  These findings were personally discussed with the patient.  Please see medical record for radiology report.  ____________________________________________   PROCEDURES  Procedure(s) performed:  Procedures    Critical Care performed: no ____________________________________________   INITIAL IMPRESSION / ASSESSMENT AND PLAN / ED COURSE  Pertinent labs & imaging results that were  available during my care of the patient were reviewed by me and considered in my medical decision making (see chart for details).  DDX: electrolyte abn, mass, medication effect, essential tremor  Kip Kautzman is a 72 y.o. who presents to the ED with tremor and symptoms as described above.  Patient nontoxic-appearing.  No focal deficits.  Possible mild dystonic reaction but fairly minimal symptoms.    Clinical Course as of Apr 07 1524  Fri Apr 07, 2018  1250 Patient reassessed.  Blood work is reassuring.  CT head is reassuring.  At this point believe she stable for further work-up as an outpatient.  However may be early TD related to her haldol.  Will consult psychiatry for further recommendations.   [PR]    Clinical Course User Index [PR] Merlyn Lot, MD    Patient evaluated by Dr. Weber Cooks of psychiatry.  He has recommended discontinuing Haldol and will provide prescription for Seroquel.  Patient has close outpatient follow-up.  Stable and appropriate for outpatient follow-up.   As part of my medical decision making, I reviewed the following data within the [facility] notes reviewed and incorporated, Labs reviewed, notes from prior ED visits.  ____________________________________________   FINAL CLINICAL IMPRESSION(S) / ED DIAGNOSES  Final diagnoses:  Tremor      NEW MEDICATIONS STARTED DURING THIS VISIT:  Discharge Medication List as of 04/07/2018  2:52 PM    START taking these medications   Details  QUEtiapine (SEROQUEL) 100 MG tablet Take 1 tablet (100 mg total) by mouth at bedtime., Starting Fri 04/07/2018, Print         Note:  This document was prepared using Dragon voice recognition software and may include unintentional dictation errors.    Merlyn Lot, MD 04/07/18 1525

## 2018-04-07 NOTE — ED Triage Notes (Signed)
Patient reports worsening tremor in chin for the last two days. Patient denies history of any other tremors. Also complaining of left arm being sore. Denies any injury.

## 2018-04-10 ENCOUNTER — Telehealth: Payer: Self-pay

## 2018-04-10 NOTE — Telephone Encounter (Signed)
pt called stated that she was in the er on friday and that dr. Weber Cooks changed her medications she needed to let you know.   Attached er plan notes (see copy below)  Patient evaluated by Dr. Weber Cooks of psychiatry.  He has recommended discontinuing Haldol and will provide prescription for Seroquel.  Patient has close outpatient follow-up.  Stable and appropriate for outpatient follow-up.

## 2018-04-30 ENCOUNTER — Ambulatory Visit
Admission: RE | Admit: 2018-04-30 | Discharge: 2018-04-30 | Disposition: A | Discharge status: Home / Self Care | Payer: Medicare HMO | Source: Ambulatory Visit | Attending: Nurse Practitioner | Admitting: Nurse Practitioner

## 2018-04-30 DIAGNOSIS — M5418 Radiculopathy, sacral and sacrococcygeal region: Secondary | ICD-10-CM

## 2018-04-30 DIAGNOSIS — K5730 Diverticulosis of large intestine without perforation or abscess without bleeding: Secondary | ICD-10-CM | POA: Diagnosis not present

## 2018-04-30 MED ORDER — GADOBUTROL 1 MMOL/ML IV SOLN
7.5000 mL | Freq: Once | INTRAVENOUS | Status: AC | PRN
  Administered 2018-04-30: 7.5 mL via INTRAVENOUS

## 2018-05-04 NOTE — Telephone Encounter (Signed)
She had CT  of her head 04/07/2018 and if someone can print it we can mail it to her

## 2018-05-04 NOTE — Progress Notes (Signed)
[place] MD OP Progress Note  05/05/2018 10:49 AM Stacey Boyd  MRN:  [id number]  Chief Complaint: ' I am here for follow up." Chief Complaint    Follow-up; Medication Refill     HPI: Stacey Boyd is a 72 year old Caucasian female, divorced, lives in [place] with a roommate, retired, presented to the clinic today for a follow-up visit.  She has a history of delusional disorder, hypertension, hypothyroidism.  Patient today presented along with her roommate who provided collateral information.  Patient reports she was recently seen in the emergency department for EPS from Haldol.  Psychiatric consult team evaluated her and her Haldol was changed to Seroquel 100 mg.  Patient reports the Seroquel 100 mg makes her extremely drowsy.  She hence has been taking only 50 mg.  Patient today reports she does not have any paranoia, delusions or auditory hallucinations.  She reports there are times when she hear music coming from the upstairs apartment or from outside.  Her roommate however reports he does not think these are hallucinations since there are times when it truly happens.  She denies any significant mood symptoms.  She reports appetite is fair.  She denies any suicidality or homicidality.  She looks forward to Christmas holidays and plans to spend it with her family.    Visit Diagnosis:    ICD-10-CM   1. Delusional disorder (HCC) F22 QUEtiapine (SEROQUEL) 25 MG tablet  2. Primary insomnia F51.01 QUEtiapine (SEROQUEL) 25 MG tablet    Past Psychiatric History: Reviewed past psychiatric history from my progress note on 12/29/2017.  Past Medical History:  Past Medical History:  Diagnosis Date  . Allergy   . Arthritis   . Cancer ([place])    skin cancer on right shoulder.   . Diabetes mellitus without complication ([place])    Pt states she is diet controlled.   . Glaucoma   . Hyperlipidemia   . Hypertension   . Thyroid disease   . Wrist fracture 2001   right    Past Surgical History:   Procedure Laterality Date  . ABDOMINAL HYSTERECTOMY  2005  . APPENDECTOMY  1980  . CARDIAC CATHETERIZATION Left 09/17/2015   Procedure: Left Heart Cath and Coronary Angiography;  Surgeon: Teodoro Spray, MD;  Location: [facility] CV LAB;  Service: Cardiovascular;  Laterality: Left;  . COLONOSCOPY WITH PROPOFOL N/A 11/07/2017   Procedure: COLONOSCOPY WITH PROPOFOL;  Surgeon: Jonathon Bellows, MD;  Location: [facility] ENDOSCOPY;  Service: Gastroenterology;  Laterality: N/A;  . FRACTURE SURGERY      Family Psychiatric History: Reviewed family psychiatric history from my progress note on 12/29/2017.  Family History:  Family History  Problem Relation Age of Onset  . Heart disease Mother        h/o rheumatic fever  . Heart disease Father   . Stroke Father   . Diabetes Sister   . Diabetes Brother   . Diabetes Sister   . Mental illness Other   . Colon cancer Neg Hx   . Breast cancer Neg Hx     Social History: Reviewed social history from my progress note on 12/29/2017. Social History   Socioeconomic History  . Marital status: Divorced    Spouse name: Not on file  . Number of children: 3  . Years of education: Not on file  . Highest education level: 10th grade  Occupational History  . Not on file  Social Needs  . Financial resource strain: Not hard at all  . Food insecurity:  Worry: Never true    Inability: Never true  . Transportation needs:    Medical: No    Non-medical: No  Tobacco Use  . Smoking status: Former Smoker    Last attempt to quit: 09/17/1979    Years since quitting: 38.6  . Smokeless tobacco: Never Used  Substance and Sexual Activity  . Alcohol use: No  . Drug use: No  . Sexual activity: Yes    Partners: Male    Birth control/protection: None  Lifestyle  . Physical activity:    Days per week: 0 days    Minutes per session: 0 min  . Stress: Not at all  Relationships  . Social connections:    Talks on phone: Not on file    Gets together: Not on file     Attends religious service: 1 to 4 times per year    Active member of club or organization: No    Attends meetings of clubs or organizations: Never    Relationship status: Divorced  Other Topics Concern  . Not on file  Social History Narrative   3 kids, local   Lives with daughter as of 2016   Divorced ~2002    Allergies:  Allergies  Allergen Reactions  . Codeine Hives and Nausea And Vomiting    Metabolic Disorder Labs: Lab Results  Component Value Date   HGBA1C 6.7 (A) 02/10/2018   MPG 134.11 10/10/2017   No results found for: PROLACTIN Lab Results  Component Value Date   CHOL 188 10/10/2017   TRIG 107 10/10/2017   HDL 43 10/10/2017   CHOLHDL 4.4 10/10/2017   VLDL 21 10/10/2017   LDLCALC 124 (H) 10/10/2017   LDLCALC 105 (H) 08/22/2017   Lab Results  Component Value Date   TSH 2.900 02/13/2018   TSH 5.610 (H) 11/14/2017    Therapeutic Level Labs: No results found for: LITHIUM No results found for: VALPROATE No components found for:  CBMZ  Current Medications: Current Outpatient Medications  Medication Sig Dispense Refill  . atorvastatin (LIPITOR) 10 MG tablet TAKE 1 TABLET (10 MG TOTAL) BY MOUTH DAILY. 90 tablet 1  . brinzolamide (AZOPT) 1 % ophthalmic suspension Place 1 drop into both eyes 2 (two) times daily.    . Cholecalciferol (VITAMIN D3) 5000 units TABS Take 5,000 Units by mouth daily.     . fluticasone (FLONASE) 50 MCG/ACT nasal spray Place 2 sprays into both nostrils at bedtime. 16 g 2  . ibuprofen (ADVIL,MOTRIN) 200 MG tablet Take 400 mg by mouth every 6 (six) hours as needed for pain. Reported on 09/17/2015    . latanoprost (XALATAN) 0.005 % ophthalmic solution 1 drop nightly.    . levothyroxine (SYNTHROID, LEVOTHROID) 25 MCG tablet Take 1 tablet (25 mcg total) by mouth daily before breakfast. 30 tablet 3  . lisinopril-hydrochlorothiazide (PRINZIDE,ZESTORETIC) 20-12.5 MG tablet Take 1 tablet by mouth daily. MUST SCHEDULE ANNUAL EXAM FOR FURTHER REFILLS  90 tablet 0  . loratadine (CLARITIN) 10 MG tablet Take 1 tablet (10 mg total) by mouth daily. 30 tablet 1  . meloxicam (MOBIC) 7.5 MG tablet Take 1 tablet (7.5 mg total) by mouth 2 (two) times daily as needed for pain. 45 tablet 1  . Multiple Vitamins-Minerals (PRESERVISION AREDS 2) CAPS Take 1 capsule by mouth daily.    . propranolol (INDERAL) 40 MG tablet Take 0.5 tablets (20 mg total) by mouth 2 (two) times daily. 60 tablet 1  . rOPINIRole (REQUIP) 2 MG tablet TAKE 1 TABLET (2 MG  TOTAL) BY MOUTH NIGHTLY. 90 tablet 1  . timolol (TIMOPTIC) 0.5 % ophthalmic solution INSTILL ONE DROP INTO BOTH EYES DAILY  3  . valACYclovir (VALTREX) 500 MG tablet TAKE 1 TABLET DAILYFOR COLD SORES 1 TAB TWICE A DAY FOR 3 DAYS IF OUTBREAK THEN BACK TO 1 TAB DAILY  11  . benztropine (COGENTIN) 0.5 MG tablet Take 1 tablet (0.5 mg total) by mouth daily as needed for tremors. For tremors 10 tablet 1  . QUEtiapine (SEROQUEL) 25 MG tablet Take 1-2 tablets (25-50 mg total) by mouth at bedtime. Mood and sleep 180 tablet 0   No current facility-administered medications for this visit.      Musculoskeletal: Strength & Muscle Tone: within normal limits Gait & Station: normal Patient leans: N/A  Psychiatric Specialty Exam: Review of Systems  Psychiatric/Behavioral: Negative for depression. The patient is not nervous/anxious.   All other systems reviewed and are negative.   Blood pressure 121/79, pulse 83, temperature 97.8 F (36.6 C), temperature source Oral, weight 181 lb 3.2 oz (82.2 kg).Body mass index is 32.1 kg/m.  General Appearance: Casual  Eye Contact:  Fair  Speech:  Normal Rate  Volume:  Normal  Mood:  Euthymic  Affect:  Appropriate  Thought Process:  Goal Directed and Descriptions of Associations: Intact  Orientation:  Full (Time, Place, and Person)  Thought Content: Logical   Suicidal Thoughts:  No  Homicidal Thoughts:  No  Memory:  Immediate;   Fair Recent;   Fair Remote;   Fair  Judgement:   Fair  Insight:  Fair  Psychomotor Activity:  Normal  Concentration:  Concentration: Fair and Attention Span: Fair  Recall:  [employer] of Knowledge: Fair  Language: Fair  Akathisia:  No  Handed:  Right  AIMS (if indicated): 0  Assets:  Communication Skills Desire for Improvement Social Support  ADL's:  Intact  Cognition: WNL  Sleep:  Fair   Screenings: AIMS     Office Visit from 03/03/2018 in [facility] Total Score  0    AUDIT     Admission (Discharged) from 10/10/2017 in [facility]  Alcohol Use Disorder Identification Test Final Score (AUDIT)  0    PHQ2-9     Office Visit from 04/06/2018 in [facility] Office Visit from 03/27/2018 in [facility] Office Visit from 11/10/2017 in [facility] Office Visit from 08/16/2017 in [facility] from 09/18/2013 in [facility] at [facility]  PHQ-2 Total Score  0  0  0  6  0  PHQ-9 Total Score  -  -  -  12  -       Assessment and Plan: Letesha is a 72 year old Caucasian female who has a history of delusional disorder, insomnia, hypertension, thyroid disease, presented to the clinic today for a follow-up visit.  Patient is currently on Seroquel due to side effects to Haldol.  She would like her Seroquel dosage to be reduced since she is having drowsiness on the higher dosage.  Denies any significant mood symptoms or psychosis.  Will continue plan as noted below.  Plan For delusional disorder-improving Reduce Seroquel to 25 to 50 mg p.o. nightly. [place] equals 0  For insomnia Discontinue Ambien.  Discussed with patient that Seroquel also helps with sleep.  Follow-up in clinic in 2 to 3 months or sooner if needed.  More than 50 % of the time was spent for psychoeducation  and supportive psychotherapy and care coordination.  This note was generated in part or whole with voice recognition software.  Voice recognition is usually quite accurate but there are transcription errors that can and very often do occur. I apologize for any typographical errors that were not detected and corrected.       Ursula Alert, MD 05/05/2018, 10:49 AM

## 2018-05-05 ENCOUNTER — Ambulatory Visit (INDEPENDENT_AMBULATORY_CARE_PROVIDER_SITE_OTHER): Payer: Medicare HMO | Admitting: Psychiatry

## 2018-05-05 ENCOUNTER — Other Ambulatory Visit: Payer: Self-pay

## 2018-05-05 ENCOUNTER — Encounter: Payer: Self-pay | Admitting: Psychiatry

## 2018-05-05 VITALS — BP 121/79 | HR 83 | Temp 97.8°F | Wt 181.2 lb

## 2018-05-05 DIAGNOSIS — F22 Delusional disorders: Secondary | ICD-10-CM

## 2018-05-05 DIAGNOSIS — F5101 Primary insomnia: Secondary | ICD-10-CM

## 2018-05-05 MED ORDER — QUETIAPINE FUMARATE 25 MG PO TABS: 25.0000 mg | ORAL_TABLET | Freq: Every day | ORAL | 0 refills | Status: DC

## 2018-05-05 MED ORDER — BENZTROPINE MESYLATE 0.5 MG PO TABS: 0.5000 mg | ORAL_TABLET | Freq: Every day | ORAL | 1 refills | Status: DC | PRN

## 2018-05-05 NOTE — Patient Instructions (Signed)
Benztropine tablets What is this medicine? BENZTROPINE (BENZ troe peen) is for certain movement problems due to Parkinson's disease, certain medicines, or other causes. This medicine may be used for other purposes; ask your health care provider or pharmacist if you have questions. COMMON BRAND NAME(S): Cogentin What should I tell my health care provider before I take this medicine? They need to know if you have any of these conditions: -glaucoma -heart disease or a rapid heartbeat -mental problems -prostate trouble -tardive dyskinesia -an unusual or allergic reaction to benztropine, other medicines, lactose, foods, dyes, or preservatives -pregnant or trying to get pregnant -breast-feeding How should I use this medicine? Take this medicine by mouth with a full glass of water. Follow the directions on the prescription label. Take your medicine at regular intervals. Do not take your medicine more often than directed. Talk to your pediatrician regarding the use of this medicine in children. While this drug may be prescribed for children as young as 3 years for selected conditions, precautions do apply. Overdosage: If you think you have taken too much of this medicine contact a poison control center or emergency room at once. NOTE: This medicine is only for you. Do not share this medicine with others. What if I miss a dose? If you miss a dose, take it as soon as you can. If it is almost time for your next dose, take only that dose. Do not take double or extra doses. What may interact with this medicine? -haloperidol -medicines for movement abnormalities like Parkinson's disease -phenothiazines like chlorpromazine, mesoridazine, prochlorperazine, thioridazine -some antidepressants like amitriptyline, desipramine, doxepin, nortriptyline -stimulant medicines for attention, weight loss, and to stay awake -tegaserod This list may not describe all possible interactions. Give your health care  provider a list of all the medicines, herbs, non-prescription drugs, or dietary supplements you use. Also tell them if you smoke, drink alcohol, or use illegal drugs. Some items may interact with your medicine. What should I watch for while using this medicine? Visit your doctor or health care professional for regular checks on your progress. You may get drowsy or dizzy. Do not drive, use machinery, or do anything that needs mental alertness until you know how this medicine affects you. Do not stand or sit up quickly, especially if you are an older patient. This reduces the risk of dizzy or fainting spells. Alcohol may interfere with the effect of this medicine. Avoid alcoholic drinks. Your mouth may get dry. Chewing sugarless gum or sucking hard candy, and drinking plenty of water may help. Contact your doctor if the problem does not go away or is severe. This medicine may cause dry eyes and blurred vision. If you wear contact lenses you may feel some discomfort. Lubricating drops may help. See your eye doctor if the problem does not go away or is severe. You may sweat less than usual while you are taking this medicine. As a result your body temperature could rise to a dangerous level. Be careful not to get overheated during exercise or in hot weather. You could get heat stroke. Avoid taking hot baths and using hot tubs and saunas. What side effects may I notice from receiving this medicine? Side effects that you should report to your doctor or health care professional as soon as possible: -allergic reactions like skin rash, itching or hives, swelling of the face, lips, or tongue -changes in vision -confusion -decreased sweating or heat intolerance -depression -fast, irregular heartbeat -hallucinations -memory loss -muscle weakness -pain or difficulty  passing urine -vomiting Side effects that usually do not require medical attention (report to your doctor or health care professional if they  continue or are bothersome): -constipation -dry mouth -nausea This list may not describe all possible side effects. Call your doctor for medical advice about side effects. You may report side effects to FDA at 1-800-FDA-1088. Where should I keep my medicine? Keep out of the reach of children. Store below 30 degrees C (86 degrees F). Keep container tightly closed. Throw away any unused medicine after the expiration date. NOTE: This sheet is a summary. It may not cover all possible information. If you have questions about this medicine, talk to your doctor, pharmacist, or health care provider.  2019 [employer] (2007-08-02 15:38:20) Quetiapine tablets What is this medicine? QUETIAPINE (kwe TYE a peen) is an antipsychotic. It is used to treat schizophrenia and bipolar disorder, also known as manic-depression. This medicine may be used for other purposes; ask your health care provider or pharmacist if you have questions. COMMON BRAND NAME(S): Seroquel What should I tell my health care provider before I take this medicine? They need to know if you have any of these conditions: -blockage in your bowel -cataracts -constipation -dehydration -diabetes -difficulty swallowing -glaucoma -heart disease -history of breast cancer -kidney disease -liver disease -low blood counts, like low white cell, platelet, or red cell counts -low blood pressure or dizziness when standing up -Parkinson's disease -previous heart attack -prostate disease -seizures -stomach or intestine problems -suicidal thoughts, plans or attempt; a previous suicide attempt by you or a family member -thyroid disease -trouble passing urine -an unusual or allergic reaction to quetiapine, other medicines, foods, dyes, or preservatives -pregnant or trying to get pregnant -breast-feeding How should I use this medicine? Take this medicine by mouth. Swallow it with a drink of water. Follow the directions on the  prescription label. If it upsets your stomach you can take it with food. Take your medicine at regular intervals. Do not take it more often than directed. Do not stop taking except on the advice of your doctor or health care professional. A special MedGuide will be given to you by the pharmacist with each prescription and refill. Be sure to read this information carefully each time. Talk to your pediatrician regarding the use of this medicine in children. While this drug may be prescribed for children as young as 10 years for selected conditions, precautions do apply. Patients over age 17 years may have a stronger reaction to this medicine and need smaller doses. Overdosage: If you think you have taken too much of this medicine contact a poison control center or emergency room at once. NOTE: This medicine is only for you. Do not share this medicine with others. What if I miss a dose? If you miss a dose, take it as soon as you can. If it is almost time for your next dose, take only that dose. Do not take double or extra doses. What may interact with this medicine? Do not take this medicine with any of the following medications: -cisapride -dofetilide -dronedarone -fluconazole -metoclopramide -pimozide -posaconazole -thioridazine This medicine may also interact with the following medications: -alcohol -antihistamines for allergy cough and cold -antiviral medicines for HIV or AIDS -atropine -certain medicines for bladder problems like oxybutynin, tolterodine -certain medicines for blood pressure -certain medicines for depression, anxiety, or psychotic disturbances -certain medicines for diabetes -certain medicines for stomach problems like dicyclomine, hyoscyamine -certain medicines for travel sickness like scopolamine -certain medicines for Parkinson's disease -  certain medicines for seizures like carbamazepine, phenobarbital, phenytoin -cimetidine -erythromycin -ipratropium -other  medicines that prolong the QT interval (cause an abnormal heart rhythm) -rifampin -steroid medicines like prednisone or cortisone This list may not describe all possible interactions. Give your health care provider a list of all the medicines, herbs, non-prescription drugs, or dietary supplements you use. Also tell them if you smoke, drink alcohol, or use illegal drugs. Some items may interact with your medicine. What should I watch for while using this medicine? Visit your doctor or health care professional for regular checks on your progress. It may be several weeks before you see the full effects of this medicine. Your health care provider may suggest that you have your eyes examined prior to starting this medicine, and every 6 months thereafter. If you have been taking this medicine regularly for some time, do not suddenly stop taking it. You must gradually reduce the dose or your symptoms may get worse. Ask your doctor or health care professional for advice. Patients and their families should watch out for worsening depression or thoughts of suicide. Also watch out for sudden or severe changes in feelings such as feeling anxious, agitated, panicky, irritable, hostile, aggressive, impulsive, severely restless, overly excited and hyperactive, or not being able to sleep. If this happens, especially at the beginning of antidepressant treatment or after a change in dose, call your health care professional. Dennis Bast may get dizzy or drowsy. Do not drive, use machinery, or do anything that needs mental alertness until you know how this medicine affects you. Do not stand or sit up quickly, especially if you are an older patient. This reduces the risk of dizzy or fainting spells. Alcohol can increase dizziness and drowsiness. Avoid alcoholic drinks. Do not treat yourself for colds, diarrhea or allergies. Ask your doctor or health care professional for advice, some ingredients may increase possible side  effects. This medicine can reduce the response of your body to heat or cold. Dress warm in cold weather and stay hydrated in hot weather. If possible, avoid extreme temperatures like saunas, hot tubs, very hot or cold showers, or activities that can cause dehydration such as vigorous exercise. What side effects may I notice from receiving this medicine? Side effects that you should report to your doctor or health care professional as soon as possible: -allergic reactions like skin rash, itching or hives, swelling of the face, lips, or tongue -changes in vision -difficulty swallowing -elevated mood, decreased need for sleep, racing thoughts, impulsive behavior -eye pain -redness, blistering, peeling, or loosening of the skin, including inside the mouth -restlessness, pacing, inability to keep still -seizures -signs and symptoms of a dangerous change in heartbeat or heart rhythm like chest pain; dizziness; fast, irregular heartbeat; palpitations; feeling faint or lightheaded; falls; breathing problems -signs and symptoms of high blood sugar such as dizziness; dry mouth; dry skin; fruity breath; nausea; stomach pain; increased hunger; increased thirst; increased urination -signs and symptoms of hypothyroidism like fatigue; increased sensitivity to cold; weight gain; hoarseness; thinning hair -signs and symptoms of infection like fever; chills; cough; sore throat; pain or trouble passing urine -signs and symptoms of low blood pressure like dizziness; feeling faint or lightheaded; falls; unusually weak or tired -signs and symptoms of neuroleptic malignant syndrome (NMS) like confusion; fast, irregular heartbeat; high fever; increased sweating; stiff muscles -signs and symptoms of a stroke like changes in vision; confusion; trouble speaking or understanding; severe headaches; sudden numbness or weakness of the face, arm or leg; trouble walking;  dizziness; loss of balance or coordination -signs and  symptoms of tardive dyskinesia, like uncontrollable head, mouth, neck, arm, or leg movements -suicidal thoughts, mood changes Side effects that usually do not require medical attention (report to your doctor or health care professional if they continue or are bothersome): -change in sex drive or performance -constipation -drowsiness -dry mouth -upset stomach -weight gain This list may not describe all possible side effects. Call your doctor for medical advice about side effects. You may report side effects to FDA at 1-800-FDA-1088. Where should I keep my medicine? Keep out of the reach of children. Store at room temperature between 15 and 30 degrees C (59 and 86 degrees F). Throw away any unused medicine after the expiration date. NOTE: This sheet is a summary. It may not cover all possible information. If you have questions about this medicine, talk to your doctor, pharmacist, or health care provider.  2019 [employer] Standard (2017-04-22 14:16:00)

## 2018-05-29 ENCOUNTER — Encounter: Payer: Self-pay | Admitting: Nurse Practitioner

## 2018-05-29 ENCOUNTER — Ambulatory Visit (INDEPENDENT_AMBULATORY_CARE_PROVIDER_SITE_OTHER): Payer: Medicare HMO | Admitting: Nurse Practitioner

## 2018-05-29 VITALS — BP 139/83 | HR 78 | Resp 16 | Ht 63.0 in | Wt 178.2 lb

## 2018-05-29 DIAGNOSIS — M545 Low back pain, unspecified: Secondary | ICD-10-CM

## 2018-05-29 DIAGNOSIS — M533 Sacrococcygeal disorders, not elsewhere classified: Secondary | ICD-10-CM | POA: Diagnosis not present

## 2018-05-29 DIAGNOSIS — E1165 Type 2 diabetes mellitus with hyperglycemia: Secondary | ICD-10-CM

## 2018-05-29 DIAGNOSIS — G2581 Restless legs syndrome: Secondary | ICD-10-CM | POA: Diagnosis not present

## 2018-05-29 DIAGNOSIS — Z0001 Encounter for general adult medical examination with abnormal findings: Secondary | ICD-10-CM

## 2018-05-29 DIAGNOSIS — R300 Dysuria: Secondary | ICD-10-CM

## 2018-05-29 LAB — POCT GLYCOSYLATED HEMOGLOBIN (HGB A1C): HEMOGLOBIN A1C: 7.8 % — AB (ref 4.0–5.6)

## 2018-05-29 MED ORDER — MELOXICAM 7.5 MG PO TABS: 7.5000 mg | ORAL_TABLET | Freq: Two times a day (BID) | ORAL | 3 refills | Status: DC | PRN

## 2018-05-29 MED ORDER — ROPINIROLE HCL 2 MG PO TABS: ORAL_TABLET | ORAL | 1 refills | Status: DC

## 2018-05-29 MED ORDER — METFORMIN HCL 500 MG PO TABS: 250.0000 mg | ORAL_TABLET | Freq: Two times a day (BID) | ORAL | 3 refills | Status: DC

## 2018-05-29 NOTE — Progress Notes (Signed)
[facility] [place], [place] [address]  Internal MEDICINE  Office Visit Note  Patient Name: Stacey Boyd  [id number]  [id number]  Date of Service: 05/31/2018   Pt is here for routine health maintenance examination  Chief Complaint  Patient presents with  . Medicare Wellness    well visit  . Pain    tailbone and hip, pt states she has an appointment with orthopedic  . Sinusitis    pt having sinus drainage at night time that makes her sick to her stomach  . Quality Metric Gaps    foot exam     The patient is here for routine health maintenance exam. Her sugars have been elevated of late. Exercise ability is limited due to pain in tailbone and hip. She also has significant sinus pressure and drainage which is making her sick to her stomach every night. She currently does not take anything to control her blood sugars. HgbA1c is 7.8 today which is very elevated compared to prior checks.     Current Medication: Outpatient Encounter Medications as of 05/29/2018  Medication Sig  . atorvastatin (LIPITOR) 10 MG tablet TAKE 1 TABLET (10 MG TOTAL) BY MOUTH DAILY.  . benztropine (COGENTIN) 0.5 MG tablet Take 1 tablet (0.5 mg total) by mouth daily as needed for tremors. For tremors  . brinzolamide (AZOPT) 1 % ophthalmic suspension Place 1 drop into both eyes 2 (two) times daily.  . Cholecalciferol (VITAMIN D3) 5000 units TABS Take 5,000 Units by mouth daily.   . fluticasone (FLONASE) 50 MCG/ACT nasal spray Place 2 sprays into both nostrils at bedtime.  Marland Kitchen ibuprofen (ADVIL,MOTRIN) 200 MG tablet Take 400 mg by mouth every 6 (six) hours as needed for pain. Reported on 09/17/2015  . latanoprost (XALATAN) 0.005 % ophthalmic solution 1 drop nightly.  . levothyroxine (SYNTHROID, LEVOTHROID) 25 MCG tablet Take 1 tablet (25 mcg total) by mouth daily before breakfast.  . lisinopril-hydrochlorothiazide (PRINZIDE,ZESTORETIC) 20-12.5 MG tablet Take 1 tablet by mouth daily. MUST  SCHEDULE ANNUAL EXAM FOR FURTHER REFILLS  . loratadine (CLARITIN) 10 MG tablet Take 1 tablet (10 mg total) by mouth daily.  . meloxicam (MOBIC) 7.5 MG tablet Take 1 tablet (7.5 mg total) by mouth 2 (two) times daily as needed for pain.  . Multiple Vitamins-Minerals (PRESERVISION AREDS 2) CAPS Take 1 capsule by mouth daily.  . propranolol (INDERAL) 40 MG tablet Take 0.5 tablets (20 mg total) by mouth 2 (two) times daily.  . QUEtiapine (SEROQUEL) 25 MG tablet Take 1-2 tablets (25-50 mg total) by mouth at bedtime. Mood and sleep  . rOPINIRole (REQUIP) 2 MG tablet TAKE 1 TABLET (2 MG TOTAL) BY MOUTH NIGHTLY.  . timolol (TIMOPTIC) 0.5 % ophthalmic solution INSTILL ONE DROP INTO BOTH EYES DAILY  . valACYclovir (VALTREX) 500 MG tablet TAKE 1 TABLET DAILYFOR COLD SORES 1 TAB TWICE A DAY FOR 3 DAYS IF OUTBREAK THEN BACK TO 1 TAB DAILY  . [DISCONTINUED] meloxicam (MOBIC) 7.5 MG tablet Take 1 tablet (7.5 mg total) by mouth 2 (two) times daily as needed for pain.  . [DISCONTINUED] rOPINIRole (REQUIP) 2 MG tablet TAKE 1 TABLET (2 MG TOTAL) BY MOUTH NIGHTLY.  . metFORMIN (GLUCOPHAGE) 500 MG tablet Take 0.5 tablets (250 mg total) by mouth 2 (two) times daily with a meal.  . [DISCONTINUED] haloperidol (HALDOL) 5 MG tablet Take 0.5 tablets (2.5 mg total) by mouth at bedtime.  . [DISCONTINUED] zolpidem (AMBIEN) 5 MG tablet Take 1 tablet (5 mg total)  by mouth at bedtime as needed for sleep.   No facility-administered encounter medications on file as of 05/29/2018.     Surgical History: Past Surgical History:  Procedure Laterality Date  . ABDOMINAL HYSTERECTOMY  2005  . APPENDECTOMY  1980  . CARDIAC CATHETERIZATION Left 09/17/2015   Procedure: Left Heart Cath and Coronary Angiography;  Surgeon: Teodoro Spray, MD;  Location: [facility];  Service: Cardiovascular;  Laterality: Left;  . COLONOSCOPY WITH PROPOFOL N/A 11/07/2017   Procedure: COLONOSCOPY WITH PROPOFOL;  Surgeon: Jonathon Bellows, MD;  Location:  [facility] ENDOSCOPY;  Service: Gastroenterology;  Laterality: N/A;  . FRACTURE SURGERY      Medical History: Past Medical History:  Diagnosis Date  . Allergy   . Arthritis   . Cancer ([place])    skin cancer on right shoulder.   . Diabetes mellitus without complication ([place])    Pt states she is diet controlled.   . Glaucoma   . Hyperlipidemia   . Hypertension   . Thyroid disease   . Wrist fracture 2001   right    Family History: Family History  Problem Relation Age of Onset  . Heart disease Mother        h/o rheumatic fever  . Heart disease Father   . Stroke Father   . Diabetes Sister   . Diabetes Brother   . Diabetes Sister   . Mental illness Other   . Colon cancer Neg Hx   . Breast cancer Neg Hx       Review of Systems  Constitutional: Positive for activity change and fatigue. Negative for chills and unexpected weight change.  HENT: Negative for congestion, postnasal drip, rhinorrhea, sneezing and sore throat.   Respiratory: Negative for cough, chest tightness, shortness of breath and wheezing.   Cardiovascular: Negative for chest pain and palpitations.  Gastrointestinal: Negative for abdominal pain, constipation, diarrhea, nausea and vomiting.  Endocrine: Negative for cold intolerance, heat intolerance, polydipsia and polyuria.       Blood sugars have been elevated recently.   Genitourinary: Negative for dysuria and frequency.  Musculoskeletal: Positive for arthralgias and back pain. Negative for joint swelling and neck pain.       The patient has significant pain in tailbone, rates 9/10 in severity. Negative x-ra done 04/04/2018  Skin: Negative for rash.  Allergic/Immunologic: Negative for environmental allergies.  Neurological: Positive for weakness. Negative for dizziness, tremors, light-headedness, numbness and headaches.  Hematological: Negative for adenopathy. Does not bruise/bleed easily.  Psychiatric/Behavioral: Positive for dysphoric mood. Negative for  behavioral problems (Depression), sleep disturbance and suicidal ideas. The patient is nervous/anxious.      Today's Vitals   05/29/18 1129  BP: 139/83  Pulse: 78  Resp: 16  SpO2: 97%  Weight: 178 lb 3.2 oz (80.8 kg)  Height: '5\' 3"'  (1.6 m)     Physical Exam Vitals signs and nursing note reviewed.  Constitutional:      General: She is not in acute distress.    Appearance: Normal appearance. She is well-developed. She is not diaphoretic.  HENT:     Head: Normocephalic and atraumatic.     Mouth/Throat:     Pharynx: No oropharyngeal exudate.  Eyes:     Conjunctiva/sclera: Conjunctivae normal.     Pupils: Pupils are equal, round, and reactive to light.  Neck:     Musculoskeletal: Normal range of motion and neck supple.     Thyroid: No thyromegaly.     Vascular: No carotid bruit or JVD.  Trachea: No tracheal deviation.  Cardiovascular:     Rate and Rhythm: Normal rate and regular rhythm.     Pulses: Normal pulses.          Dorsalis pedis pulses are 2+ on the right side and 2+ on the left side.       Posterior tibial pulses are 2+ on the left side.     Heart sounds: Normal heart sounds. No murmur. No friction rub. No gallop.   Pulmonary:     Effort: Pulmonary effort is normal. No respiratory distress.     Breath sounds: Normal breath sounds. No wheezing or rales.  Chest:     Chest wall: No tenderness.     Breasts:        Right: Normal. No swelling, bleeding, mass, nipple discharge or skin change.        Left: Normal. No swelling, bleeding, inverted nipple, mass, nipple discharge, skin change or tenderness.  Abdominal:     General: Abdomen is flat. Bowel sounds are normal.     Tenderness: There is no abdominal tenderness. There is no guarding.  Musculoskeletal: Normal range of motion.       Back:  Feet:     Right foot:     Protective Sensation: 10 sites tested. 10 sites sensed.     Skin integrity: Skin integrity normal.     Left foot:     Protective Sensation: 10  sites tested. 10 sites sensed.     Skin integrity: Skin integrity normal.  Lymphadenopathy:     Cervical: No cervical adenopathy.  Skin:    General: Skin is warm and dry.     Capillary Refill: Capillary refill takes less than 2 seconds.  Neurological:     Mental Status: She is alert and oriented to person, place, and time. Mental status is at baseline.     Cranial Nerves: No cranial nerve deficit.  Psychiatric:        Behavior: Behavior normal.        Thought Content: Thought content normal.        Judgment: Judgment normal.    Depression screen [facility] 2/9 05/29/2018 04/06/2018 03/27/2018 11/10/2017 08/16/2017  Decreased Interest 0 0 0 0 3  Down, Depressed, Hopeless 0 0 0 0 3  PHQ - 2 Score 0 0 0 0 6  Altered sleeping - - - - 3  Tired, decreased energy - - - - 3  Change in appetite - - - - 0  Feeling bad or failure about yourself  - - - - 0  Trouble concentrating - - - - 0  Moving slowly or fidgety/restless - - - - 0  Suicidal thoughts - - - - 0  PHQ-9 Score - - - - 12    Functional Status Survey: Is the patient deaf or have difficulty hearing?: No Does the patient have difficulty seeing, even when wearing glasses/contacts?: Yes Does the patient have difficulty concentrating, remembering, or making decisions?: No Does the patient have difficulty walking or climbing stairs?: Yes(due to tailbone) Does the patient have difficulty dressing or bathing?: No Does the patient have difficulty doing errands alone such as visiting a doctor's office or shopping?: No  MMSE - Mini Mental State Exam 05/29/2018  Orientation to time 5  Orientation to Place 5  [place] 3  Attention/ Calculation 5  Recall 3  Language- name 2 objects 2  Language- repeat 1  Language- follow 3 step command 3  Language- read & follow direction 1  Write a sentence 1  Copy design 1  Total score 30    Fall Risk  05/29/2018 04/06/2018 03/27/2018 11/10/2017 08/16/2017  Falls in the past year? 0 1 1 Yes Yes  Number  falls in past yr: - '1 1 1 1  ' Injury with Fall? - 1 1 No -  Comment - - tailbone injury - -  Risk for fall due to : - Other (Comment) - - -      LABS: Recent Results (from the past 2160 hour(s))  CBC with Differential/Platelet     Status: None   Collection Time: 04/07/18 11:18 AM  Result Value Ref Range   WBC 5.8 4.0 - 10.5 K/uL   RBC 4.27 3.87 - 5.11 MIL/uL   Hemoglobin 13.2 12.0 - 15.0 g/dL   HCT 39.4 36.0 - 46.0 %   MCV 92.3 80.0 - 100.0 fL   MCH 30.9 26.0 - 34.0 pg   MCHC 33.5 30.0 - 36.0 g/dL   RDW 12.1 11.5 - 15.5 %   Platelets 270 150 - 400 K/uL   nRBC 0.0 0.0 - 0.2 %   Neutrophils Relative % 54 %   Neutro Abs 3.1 1.7 - 7.7 K/uL   Lymphocytes Relative 35 %   Lymphs Abs 2.0 0.7 - 4.0 K/uL   Monocytes Relative 8 %   Monocytes Absolute 0.5 0.1 - 1.0 K/uL   Eosinophils Relative 2 %   Eosinophils Absolute 0.1 0.0 - 0.5 K/uL   Basophils Relative 1 %   Basophils Absolute 0.1 0.0 - 0.1 K/uL   Immature Granulocytes 0 %   Abs Immature Granulocytes 0.02 0.00 - 0.07 K/uL    Comment: Performed at [facility], [address] [place], [place] [address]  Comprehensive metabolic panel     Status: Abnormal   Collection Time: 04/07/18 11:18 AM  Result Value Ref Range   Sodium 140 135 - 145 mmol/L   Potassium 4.2 3.5 - 5.1 mmol/L   Chloride 105 98 - 111 mmol/L   CO2 27 22 - 32 mmol/L   Glucose, Bld 178 (H) 70 - 99 mg/dL   BUN 29 (H) 8 - 23 mg/dL   Creatinine, Ser 0.96 0.44 - 1.00 mg/dL   Calcium 9.2 8.9 - 10.3 mg/dL   Total Protein 7.0 6.5 - 8.1 g/dL   Albumin 4.0 3.5 - 5.0 g/dL   AST 19 15 - 41 U/L   ALT 21 0 - 44 U/L   Alkaline Phosphatase 69 38 - 126 U/L   Total Bilirubin 0.7 0.3 - 1.2 mg/dL   GFR calc non Af Amer 57 (L) >60 mL/min   GFR calc Af Amer >60 >60 mL/min    Comment: (NOTE) The eGFR has been calculated using the CKD EPI equation. This calculation has not been validated in all clinical situations. eGFR's persistently <60 mL/min signify possible  Chronic Kidney Disease.    Anion gap 8 5 - 15    Comment: Performed at [facility], [address] [place], [place] [address]  Troponin I - ONCE - STAT     Status: None   Collection Time: 04/07/18 11:18 AM  Result Value Ref Range   Troponin I <0.03 <0.03 ng/mL    Comment: Performed at [facility], [address] [place], [place] [address]  UA/M w/rflx Culture, Routine     Status: Abnormal   Collection Time: 05/29/18 12:00 AM  Result Value Ref Range   Specific Gravity, UA 1.020 1.005 - 1.030  pH, UA 5.0 5.0 - 7.5   Color, UA Yellow Yellow   Appearance Ur Clear Clear   Leukocytes, UA Negative Negative   Protein, UA Negative Negative/Trace   Glucose, UA 2+ (A) Negative   Ketones, UA Negative Negative   RBC, UA Negative Negative   Bilirubin, UA Negative Negative   Urobilinogen, Ur 0.2 0.2 - 1.0 mg/dL   Nitrite, UA Negative Negative   Microscopic Examination Comment     Comment: Microscopic follows if indicated.   Microscopic Examination See below:     Comment: Microscopic was indicated and was performed.   Urinalysis Reflex Comment     Comment: This specimen will not reflex to a Urine Culture.  Microscopic Examination     Status: None   Collection Time: 05/29/18 12:00 AM  Result Value Ref Range   WBC, UA 0-5 0 - 5 /hpf   RBC, UA 0-2 0 - 2 /hpf   Epithelial Cells (non renal) None seen 0 - 10 /hpf   Casts None seen None seen /lpf   Mucus, UA Present Not Estab.   Bacteria, UA None seen None seen/Few  POCT HgB A1C     Status: Abnormal   Collection Time: 05/29/18 11:42 AM  Result Value Ref Range   Hemoglobin A1C 7.8 (A) 4.0 - 5.6 %   HbA1c POC (<> result, manual entry)     HbA1c, POC (prediabetic range)     HbA1c, POC (controlled diabetic range)     Assessment/Plan: 1. Encounter for general adult medical examination with abnormal finding Annual health maintenance exam today  2. Uncontrolled type 2 diabetes mellitus with hyperglycemia (HCC) - POCT  HgB A1C 7.8 today. Start metformin 210m twice daily. Recommend limiting intake of sugar and carbohydrates. Increase regular exercise as tolerated.  - metFORMIN (GLUCOPHAGE) 500 MG tablet; Take 0.5 tablets (250 mg total) by mouth 2 (two) times daily with a meal.  Dispense: 30 tablet; Refill: 3  3. Pain in the coccyx Refill meloxicam 7.573mtwice daily to reduce pain and inflammation. Follow up with orthopedics as scheduled.  - meloxicam (MOBIC) 7.5 MG tablet; Take 1 tablet (7.5 mg total) by mouth 2 (two) times daily as needed for pain.  Dispense: 45 tablet; Refill: 3  4. Acute midline low back pain without sciatica Refill meloxicam [id number] daily to reduce pain and inflammation. Follow up with orthopedics as scheduled.  - meloxicam (MOBIC) 7.5 MG tablet; Take 1 tablet (7.5 mg total) by mouth 2 (two) times daily as needed for pain.  Dispense: 45 tablet; Refill: 3  5. Restless leg syndrome Continue requip 2mg36m bedtime  - rOPINIRole (REQUIP) 2 MG tablet; TAKE 1 TABLET (2 MG TOTAL) BY MOUTH NIGHTLY.  Dispense: 90 tablet; Refill: 1  6. Dysuria - UA/M w/rflx Culture, Routine  General Counseling: Joyclonetta blassingameerstanding of the findings of todays visit and agrees with plan of treatment. I have discussed any further diagnostic evaluation that may be needed or ordered today. We also reviewed her medications today. she has been encouraged to call the office with any questions or concerns that should arise related to todays visit.    Counseling:  Diabetes Counseling:  1. Addition of ACE inh/ ARB'S for nephroprotection. Microalbumin is updated  2. Diabetic foot care, prevention of complications. Podiatry consult 3. Exercise and lose weight.  4. Diabetic eye examination, Diabetic eye exam is updated  5. Monitor blood sugar closlely. nutrition counseling.  6. Sign and symptoms of hypoglycemia including shaking sweating,confusion and headaches.  This patient was seen by Leretha Pol FNP  Collaboration with Dr Lavera Guise as a part of collaborative care agreement   Orders Placed This Encounter  Procedures  . Microscopic Examination  . UA/M w/rflx Culture, Routine  . POCT HgB A1C    Meds ordered this encounter  Medications  . metFORMIN (GLUCOPHAGE) 500 MG tablet    Sig: Take 0.5 tablets (250 mg total) by mouth 2 (two) times daily with a meal.    Dispense:  30 tablet    Refill:  3    Order Specific Question:   Supervising Provider    Answer:   Lavera Guise [place]  . meloxicam (MOBIC) 7.5 MG tablet    Sig: Take 1 tablet (7.5 mg total) by mouth 2 (two) times daily as needed for pain.    Dispense:  45 tablet    Refill:  3    Order Specific Question:   Supervising Provider    Answer:   Lavera Guise [[id number]]  . rOPINIRole (REQUIP) 2 MG tablet    Sig: TAKE 1 TABLET (2 MG TOTAL) BY MOUTH NIGHTLY.    Dispense:  90 tablet    Refill:  1    Order Specific Question:   Supervising Provider    Answer:   Lavera Guise [[id number]]    Time spent: [place], MD  Internal Medicine

## 2018-05-30 LAB — MICROSCOPIC EXAMINATION
Bacteria, UA: NONE SEEN
CASTS: NONE SEEN /LPF
Epithelial Cells (non renal): NONE SEEN /hpf (ref 0–10)

## 2018-05-30 LAB — UA/M W/RFLX CULTURE, ROUTINE
BILIRUBIN UA: NEGATIVE
Ketones, UA: NEGATIVE
LEUKOCYTES UA: NEGATIVE
Nitrite, UA: NEGATIVE
PH UA: 5 (ref 5.0–7.5)
PROTEIN UA: NEGATIVE
RBC UA: NEGATIVE
SPEC GRAV UA: 1.02 (ref 1.005–1.030)
Urobilinogen, Ur: 0.2 mg/dL (ref 0.2–1.0)

## 2018-05-31 DIAGNOSIS — R300 Dysuria: Secondary | ICD-10-CM | POA: Insufficient documentation

## 2018-05-31 DIAGNOSIS — G2581 Restless legs syndrome: Secondary | ICD-10-CM | POA: Insufficient documentation

## 2018-06-14 ENCOUNTER — Other Ambulatory Visit: Payer: Self-pay

## 2018-06-14 DIAGNOSIS — E039 Hypothyroidism, unspecified: Secondary | ICD-10-CM

## 2018-06-14 MED ORDER — LEVOTHYROXINE SODIUM 25 MCG PO TABS: 25.0000 ug | ORAL_TABLET | Freq: Every day | ORAL | 3 refills | Status: DC

## 2018-07-05 ENCOUNTER — Encounter: Payer: Self-pay | Admitting: Psychiatry

## 2018-07-05 ENCOUNTER — Ambulatory Visit (INDEPENDENT_AMBULATORY_CARE_PROVIDER_SITE_OTHER): Payer: Medicare HMO | Admitting: Psychiatry

## 2018-07-05 ENCOUNTER — Other Ambulatory Visit: Payer: Self-pay

## 2018-07-05 VITALS — BP 115/71 | HR 81 | Temp 98.0°F | Wt 176.4 lb

## 2018-07-05 DIAGNOSIS — F22 Delusional disorders: Secondary | ICD-10-CM | POA: Diagnosis not present

## 2018-07-05 DIAGNOSIS — F5101 Primary insomnia: Secondary | ICD-10-CM

## 2018-07-05 MED ORDER — QUETIAPINE FUMARATE 100 MG PO TABS: 100.0000 mg | ORAL_TABLET | Freq: Every day | ORAL | 0 refills | Status: DC

## 2018-07-05 NOTE — Progress Notes (Signed)
[facility] OP Progress Note  07/05/2018 1:02 PM Stacey Boyd  MRN:  [id number]  Chief Complaint: ' I am here for follow up." Chief Complaint    Follow-up     HPI: Stacey Boyd is a 73 yr old female, divorced, lives in [place] , retired, presented to clinic today for a follow-up visit.  Patient has a history of delusional disorder, hypertension, hypothyroidism.  Patient today presented along with her roommate Mr. Elberta Fortis Lucido at [phone], who provided collateral information.  Patient today reports she started hearing voices or noises coming from upstairs again and it usually happens at night.  She reports she has been hearing 3 different voices of two female voices and a female voice.  She reports they play music and chat all night long.  She reports she also heard someone report they want to get rid of her.  Patient reports this started affecting her sleep.  This has been going on since the past 1 month or more.  Patient reports this started after her Haldol was changed to Seroquel and the dosage was reduced.  Her Seroquel dosage was reduced since she was feeling extremely drowsy and tired during the day.  Patient reports because of the voices and the sleep problems she currently stays with her granddaughter.  She reports at her granddaughter's place she is able to sleep and does not have any problems.  She reports she would like to continue to stay with her granddaughter if possible.  Based on collateral information from roommate patient started having these auditory hallucinations since the past few weeks and it is worsening.  Per him he does not believe there is anyone upstairs who is making those voices at all.  Based on collateral information patient is paranoid.  Patient however is better since she is at her granddaughter's place now where she is able to sleep.  Discussed readjusting her dosage to Seroquel 100 mg at bedtime.  Also discussed calling 911 or going to the nearest emergency  department if her voices worsen.   Visit Diagnosis:    ICD-10-CM   1. Delusional disorder (HCC) F22 QUEtiapine (SEROQUEL) 100 MG tablet  2. Primary insomnia F51.01     Past Psychiatric History: I have reviewed past psychiatric history from my progress note on 12/29/2017.  Past Medical History:  Past Medical History:  Diagnosis Date  . Allergy   . Arthritis   . Cancer ([place])    skin cancer on right shoulder.   . Diabetes mellitus without complication ([place])    Pt states she is diet controlled.   . Glaucoma   . Hyperlipidemia   . Hypertension   . Thyroid disease   . Wrist fracture 2001   right    Past Surgical History:  Procedure Laterality Date  . ABDOMINAL HYSTERECTOMY  2005  . APPENDECTOMY  1980  . CARDIAC CATHETERIZATION Left 09/17/2015   Procedure: Left Heart Cath and Coronary Angiography;  Surgeon: Teodoro Spray, MD;  Location: [facility];  Service: Cardiovascular;  Laterality: Left;  . COLONOSCOPY WITH PROPOFOL N/A 11/07/2017   Procedure: COLONOSCOPY WITH PROPOFOL;  Surgeon: Jonathon Bellows, MD;  Location: [facility];  Service: Gastroenterology;  Laterality: N/A;  . FRACTURE SURGERY      Family Psychiatric History: Reviewed family psychiatric history from my progress note on 12/29/2017  Family History:  Family History  Problem Relation Age of Onset  . Heart disease Mother        h/o rheumatic fever  .  Heart disease Father   . Stroke Father   . Diabetes Sister   . Diabetes Brother   . Diabetes Sister   . Mental illness Other   . Colon cancer Neg Hx   . Breast cancer Neg Hx     Social History: Reviewed social history from my progress note on 12/29/2017 Social History   Socioeconomic History  . Marital status: Divorced    Spouse name: Not on file  . Number of children: 3  . Years of education: Not on file  . Highest education level: 10th grade  Occupational History  . Not on file  Social Needs  . Financial resource strain: Not hard at all  .  Food insecurity:    Worry: Never true    Inability: Never true  . Transportation needs:    Medical: No    Non-medical: No  Tobacco Use  . Smoking status: Former Smoker    Last attempt to quit: 09/17/1979    Years since quitting: 38.8  . Smokeless tobacco: Never Used  Substance and Sexual Activity  . Alcohol use: No  . Drug use: No  . Sexual activity: Yes    Partners: Male    Birth control/protection: None  Lifestyle  . Physical activity:    Days per week: 0 days    Minutes per session: 0 min  . Stress: Not at all  Relationships  . Social connections:    Talks on phone: Not on file    Gets together: Not on file    Attends religious service: 1 to 4 times per year    Active member of club or organization: No    Attends meetings of clubs or organizations: Never    Relationship status: Divorced  Other Topics Concern  . Not on file  Social History Narrative   3 kids, local   Lives with daughter as of 2016   Divorced ~2002    Allergies:  Allergies  Allergen Reactions  . Codeine Hives and Nausea And Vomiting    Metabolic Disorder Labs: Lab Results  Component Value Date   HGBA1C 7.8 (A) 05/29/2018   MPG 134.11 10/10/2017   No results found for: PROLACTIN Lab Results  Component Value Date   CHOL 188 10/10/2017   TRIG 107 10/10/2017   HDL 43 10/10/2017   CHOLHDL 4.4 10/10/2017   VLDL 21 10/10/2017   LDLCALC 124 (H) 10/10/2017   LDLCALC 105 (H) 08/22/2017   Lab Results  Component Value Date   TSH 2.900 02/13/2018   TSH 5.610 (H) 11/14/2017    Therapeutic Level Labs: No results found for: LITHIUM No results found for: VALPROATE No components found for:  CBMZ  Current Medications: Current Outpatient Medications  Medication Sig Dispense Refill  . atorvastatin (LIPITOR) 10 MG tablet TAKE 1 TABLET (10 MG TOTAL) BY MOUTH DAILY. 90 tablet 1  . benztropine (COGENTIN) 0.5 MG tablet Take 1 tablet (0.5 mg total) by mouth daily as needed for tremors. For tremors 10  tablet 1  . brimonidine (ALPHAGAN) 0.2 % ophthalmic solution     . brinzolamide (AZOPT) 1 % ophthalmic suspension Place 1 drop into both eyes 2 (two) times daily.    . Cholecalciferol (VITAMIN D3) 5000 units TABS Take 5,000 Units by mouth daily.     . dorzolamide-timolol (COSOPT) 22.3-6.8 MG/ML ophthalmic solution     . fluticasone (FLONASE) 50 MCG/ACT nasal spray Place 2 sprays into both nostrils at bedtime. 16 g 2  . ibuprofen (ADVIL,MOTRIN) 200 MG  tablet Take 400 mg by mouth every 6 (six) hours as needed for pain. Reported on 09/17/2015    . latanoprost (XALATAN) 0.005 % ophthalmic solution 1 drop nightly.    . levothyroxine (SYNTHROID, LEVOTHROID) 25 MCG tablet Take 1 tablet (25 mcg total) by mouth daily before breakfast. 30 tablet 3  . lisinopril-hydrochlorothiazide (PRINZIDE,ZESTORETIC) 20-12.5 MG tablet Take 1 tablet by mouth daily. MUST SCHEDULE ANNUAL EXAM FOR FURTHER REFILLS 90 tablet 0  . loratadine (CLARITIN) 10 MG tablet Take 1 tablet (10 mg total) by mouth daily. 30 tablet 1  . meloxicam (MOBIC) 7.5 MG tablet Take 1 tablet (7.5 mg total) by mouth 2 (two) times daily as needed for pain. 45 tablet 3  . metFORMIN (GLUCOPHAGE) 500 MG tablet Take 0.5 tablets (250 mg total) by mouth 2 (two) times daily with a meal. 30 tablet 3  . Multiple Vitamins-Minerals (PRESERVISION AREDS 2) CAPS Take 1 capsule by mouth daily.    . propranolol (INDERAL) 40 MG tablet Take 0.5 tablets (20 mg total) by mouth 2 (two) times daily. 60 tablet 1  . rOPINIRole (REQUIP) 2 MG tablet TAKE 1 TABLET (2 MG TOTAL) BY MOUTH NIGHTLY. 90 tablet 1  . timolol (TIMOPTIC) 0.5 % ophthalmic solution INSTILL ONE DROP INTO BOTH EYES DAILY  3  . valACYclovir (VALTREX) 500 MG tablet TAKE 1 TABLET DAILYFOR COLD SORES 1 TAB TWICE A DAY FOR 3 DAYS IF OUTBREAK THEN BACK TO 1 TAB DAILY  11  . zolpidem (AMBIEN) 5 MG tablet     . QUEtiapine (SEROQUEL) 100 MG tablet Take 1 tablet (100 mg total) by mouth at bedtime. For mood and psychosis 90  tablet 0   No current facility-administered medications for this visit.      Musculoskeletal: Strength & Muscle Tone: within normal limits Gait & Station: normal Patient leans: N/A  Psychiatric Specialty Exam: Review of Systems  Psychiatric/Behavioral: Positive for hallucinations. The patient has insomnia.   All other systems reviewed and are negative.   Blood pressure 115/71, pulse 81, temperature 98 F (36.7 C), temperature source Oral, weight 176 lb 6.4 oz (80 kg).Body mass index is 31.25 kg/m.  General Appearance: Casual  Eye Contact:  Fair  Speech:  Normal Rate  Volume:  Normal  Mood:  Anxious  Affect:  Appropriate  Thought Process:  Goal Directed and Descriptions of Associations: Intact  Orientation:  Full (Time, Place, and Person)  Thought Content: Delusions and Hallucinations: Auditory , patient is paranoid , however reports she is OK where she stays now   Suicidal Thoughts:  No  Homicidal Thoughts:  No  Memory:  Immediate;   Fair Recent;   Fair Remote;   Fair  Judgement:  Fair  Insight:  Fair  Psychomotor Activity:  Normal  Concentration:  Concentration: Fair and Attention Span: Fair  Recall:  [employer] of Knowledge: Fair  Language: Fair  Akathisia:  No  Handed:  Right  AIMS (if indicated): denies tremors, rigidity,stiffness  Assets:  Communication Skills Desire for Improvement Social Support  ADL's:  Intact  Cognition: WNL  Sleep:  restless   Screenings: AIMS     Office Visit from 03/03/2018 in [place] Total Score  0    AUDIT     Admission (Discharged) from 10/10/2017 in [place]  Alcohol Use Disorder Identification Test Final Score (AUDIT)  0    Mini-Mental     Clinical Support from 05/29/2018 in [facility], [facility]  Total  Score (max 30 points )  30    PHQ2-9     Clinical Support from 05/29/2018 in [facility], [facility] Office Visit from 04/06/2018 in [facility], [facility] Office Visit from 03/27/2018 in [facility], [facility] Office Visit from 11/10/2017 in [facility], [facility] Office Visit from 08/16/2017 in [facility], [facility]  PHQ-2 Total Score  0  0  0  0  6  PHQ-9 Total Score  -  -  -  -  12       Assessment and Plan: Stacey Boyd is a 73 yr old Caucasian female who has a history of delusional disorder, hypertension, thyroid disease, presented to clinic today for a follow-up visit.  Patient is currently struggling with psychosis and sleep problems.  Patient does have good social support system.  Will make medication changes as noted below.  Plan For delusional disorder- worsening Increase Seroquel to 100 mg p.o. nightly  For insomnia-worsening Increase Seroquel to 100 mg p.o. nightly.  Patient currently stays with her granddaughter and her psychosis and sleep problems are better with the change of location.  However discussed with roommate to also contact granddaughter to be included in the treatment plan.  Also discussed calling 911 or going to the nearest emergency department if her symptoms worsen.  Follow-up in clinic in 2 weeks or sooner if needed.  I have spent atleast 15 minutes face to face with patient today. More than 50 % of the time was spent for psychoeducation and supportive psychotherapy and care coordination.  This note was generated in part or whole with voice recognition software. Voice recognition is usually quite accurate but there are transcription errors that can and very often do occur. I apologize for any typographical errors that were not detected and corrected.        Ursula Alert, MD 07/05/2018, 1:02 PM

## 2018-07-06 ENCOUNTER — Other Ambulatory Visit: Payer: Self-pay

## 2018-07-06 ENCOUNTER — Telehealth: Payer: Self-pay | Admitting: Psychiatry

## 2018-07-06 ENCOUNTER — Ambulatory Visit (INDEPENDENT_AMBULATORY_CARE_PROVIDER_SITE_OTHER): Payer: Medicare HMO | Admitting: Adult Health

## 2018-07-06 ENCOUNTER — Encounter: Payer: Self-pay | Admitting: Adult Health

## 2018-07-06 VITALS — BP 122/72 | HR 87 | Resp 16 | Ht 63.0 in | Wt 176.0 lb

## 2018-07-06 DIAGNOSIS — F5102 Adjustment insomnia: Secondary | ICD-10-CM

## 2018-07-06 DIAGNOSIS — R5383 Other fatigue: Secondary | ICD-10-CM | POA: Diagnosis not present

## 2018-07-06 DIAGNOSIS — I10 Essential (primary) hypertension: Secondary | ICD-10-CM

## 2018-07-06 NOTE — Patient Instructions (Signed)

## 2018-07-06 NOTE — Telephone Encounter (Signed)
Returned call to Patient as well as room mate - Stacey Boyd - they are requesting a letter to move to a top floor apartment since she has difficulty sleeping due to the sounds from upstairs. Will ask [name] to work on letter and call when ready .

## 2018-07-06 NOTE — Progress Notes (Signed)
[facility] [place], [place] [address]  Internal MEDICINE  Office Visit Note  Patient Name: Stacey Boyd  [id number]  [id number]  Date of Service: 07/06/2018  Chief Complaint  Patient presents with  . Medical Management of Chronic Issues    patient is needing a letter or doctor notes about sleep issues, patient is very light sleeper and can not sleep with the noise that she is hearing     HPI Pt is here for follow up.  She reports she needs a doctors note to move apartments.  Her current apartment is on the 5th floor, and the people living above her are up and moving all night. They are talking loud, and she is unable to sleep.  She has not slept, and is having to sleep away from her apartment so she can get some sleep.  She is having to see mental health, due to increased anxiety from not being able to sleep.     Current Medication: Outpatient Encounter Medications as of 07/06/2018  Medication Sig  . atorvastatin (LIPITOR) 10 MG tablet TAKE 1 TABLET (10 MG TOTAL) BY MOUTH DAILY.  . benztropine (COGENTIN) 0.5 MG tablet Take 1 tablet (0.5 mg total) by mouth daily as needed for tremors. For tremors  . brimonidine (ALPHAGAN) 0.2 % ophthalmic solution   . Cholecalciferol (VITAMIN D3) 5000 units TABS Take 5,000 Units by mouth daily.   . dorzolamide-timolol (COSOPT) 22.3-6.8 MG/ML ophthalmic solution   . fluticasone (FLONASE) 50 MCG/ACT nasal spray Place 2 sprays into both nostrils at bedtime.  Marland Kitchen ibuprofen (ADVIL,MOTRIN) 200 MG tablet Take 400 mg by mouth every 6 (six) hours as needed for pain. Reported on 09/17/2015  . latanoprost (XALATAN) 0.005 % ophthalmic solution 1 drop nightly.  . levothyroxine (SYNTHROID, LEVOTHROID) 25 MCG tablet Take 1 tablet (25 mcg total) by mouth daily before breakfast.  . lisinopril-hydrochlorothiazide (PRINZIDE,ZESTORETIC) 20-12.5 MG tablet Take 1 tablet by mouth daily. MUST SCHEDULE ANNUAL EXAM FOR FURTHER REFILLS  . loratadine  (CLARITIN) 10 MG tablet Take 1 tablet (10 mg total) by mouth daily.  . metFORMIN (GLUCOPHAGE) 500 MG tablet Take 0.5 tablets (250 mg total) by mouth 2 (two) times daily with a meal.  . Multiple Vitamins-Minerals (PRESERVISION AREDS 2) CAPS Take 1 capsule by mouth daily.  . propranolol (INDERAL) 40 MG tablet Take 0.5 tablets (20 mg total) by mouth 2 (two) times daily.  . QUEtiapine (SEROQUEL) 100 MG tablet Take 1 tablet (100 mg total) by mouth at bedtime. For mood and psychosis  . rOPINIRole (REQUIP) 2 MG tablet TAKE 1 TABLET (2 MG TOTAL) BY MOUTH NIGHTLY.  . timolol (TIMOPTIC) 0.5 % ophthalmic solution INSTILL ONE DROP INTO BOTH EYES DAILY  . valACYclovir (VALTREX) 500 MG tablet TAKE 1 TABLET DAILYFOR COLD SORES 1 TAB TWICE A DAY FOR 3 DAYS IF OUTBREAK THEN BACK TO 1 TAB DAILY  . zolpidem (AMBIEN) 5 MG tablet   . brinzolamide (AZOPT) 1 % ophthalmic suspension Place 1 drop into both eyes 2 (two) times daily.  . meloxicam (MOBIC) 7.5 MG tablet Take 1 tablet (7.5 mg total) by mouth 2 (two) times daily as needed for pain. (Patient not taking: Reported on 07/06/2018)   No facility-administered encounter medications on file as of 07/06/2018.     Surgical History: Past Surgical History:  Procedure Laterality Date  . ABDOMINAL HYSTERECTOMY  2005  . APPENDECTOMY  1980  . CARDIAC CATHETERIZATION Left 09/17/2015   Procedure: Left Heart Cath and Coronary Angiography;  Surgeon: Teodoro Spray, MD;  Location: [place] [facility];  Service: Cardiovascular;  Laterality: Left;  . COLONOSCOPY WITH PROPOFOL N/A 11/07/2017   Procedure: COLONOSCOPY WITH PROPOFOL;  Surgeon: Jonathon Bellows, MD;  Location: [facility] ENDOSCOPY;  Service: Gastroenterology;  Laterality: N/A;  . FRACTURE SURGERY      Medical History: Past Medical History:  Diagnosis Date  . Allergy   . Arthritis   . Cancer ([place])    skin cancer on right shoulder.   . Diabetes mellitus without complication ([place])    Pt states she is diet controlled.   .  Glaucoma   . Hyperlipidemia   . Hypertension   . Thyroid disease   . Wrist fracture 2001   right    Family History: Family History  Problem Relation Age of Onset  . Heart disease Mother        h/o rheumatic fever  . Heart disease Father   . Stroke Father   . Diabetes Sister   . Diabetes Brother   . Diabetes Sister   . Mental illness Other   . Colon cancer Neg Hx   . Breast cancer Neg Hx     Social History   Socioeconomic History  . Marital status: Divorced    Spouse name: Not on file  . Number of children: 3  . Years of education: Not on file  . Highest education level: 10th grade  Occupational History  . Not on file  Social Needs  . Financial resource strain: Not hard at all  . Food insecurity:    Worry: Never true    Inability: Never true  . Transportation needs:    Medical: No    Non-medical: No  Tobacco Use  . Smoking status: Former Smoker    Last attempt to quit: 09/17/1979    Years since quitting: 38.8  . Smokeless tobacco: Never Used  Substance and Sexual Activity  . Alcohol use: No  . Drug use: No  . Sexual activity: Yes    Partners: Male    Birth control/protection: None  Lifestyle  . Physical activity:    Days per week: 0 days    Minutes per session: 0 min  . Stress: Not at all  Relationships  . Social connections:    Talks on phone: Not on file    Gets together: Not on file    Attends religious service: 1 to 4 times per year    Active member of club or organization: No    Attends meetings of clubs or organizations: Never    Relationship status: Divorced  . Intimate partner violence:    Fear of current or ex partner: No    Emotionally abused: No    Physically abused: No    Forced sexual activity: No  Other Topics Concern  . Not on file  Social History Narrative   3 kids, local   Lives with daughter as of 2016   Divorced ~2002      Review of Systems  Constitutional: Negative for chills, fatigue and unexpected weight change.   HENT: Negative for congestion, rhinorrhea, sneezing and sore throat.   Eyes: Negative for photophobia, pain and redness.  Respiratory: Negative for cough, chest tightness and shortness of breath.   Cardiovascular: Negative for chest pain and palpitations.  Gastrointestinal: Negative for abdominal pain, constipation, diarrhea, nausea and vomiting.  Endocrine: Negative.   Genitourinary: Negative for dysuria and frequency.  Musculoskeletal: Negative for arthralgias, back pain, joint swelling and neck pain.  Skin: Negative for rash.  Allergic/Immunologic: Negative.   Neurological: Negative for tremors and numbness.  Hematological: Negative for adenopathy. Does not bruise/bleed easily.  Psychiatric/Behavioral: Negative for behavioral problems and sleep disturbance. The patient is not nervous/anxious.     Vital Signs: BP 122/72   Pulse 87   Resp 16   Ht 5\' 3"  (1.6 m)   Wt 176 lb (79.8 kg)   SpO2 99%   BMI 31.18 kg/m    Physical Exam Vitals signs and nursing note reviewed.  Constitutional:      General: She is not in acute distress.    Appearance: She is well-developed. She is not diaphoretic.  HENT:     Head: Normocephalic and atraumatic.     Mouth/Throat:     Pharynx: No oropharyngeal exudate.  Eyes:     Pupils: Pupils are equal, round, and reactive to light.  Neck:     Musculoskeletal: Normal range of motion and neck supple.     Thyroid: No thyromegaly.     Vascular: No JVD.     Trachea: No tracheal deviation.  Cardiovascular:     Rate and Rhythm: Normal rate and regular rhythm.     Heart sounds: Normal heart sounds. No murmur. No friction rub. No gallop.   Pulmonary:     Effort: Pulmonary effort is normal. No respiratory distress.     Breath sounds: Normal breath sounds. No wheezing or rales.  Chest:     Chest wall: No tenderness.  Abdominal:     Palpations: Abdomen is soft.     Tenderness: There is no abdominal tenderness. There is no guarding.  Musculoskeletal:  Normal range of motion.  Lymphadenopathy:     Cervical: No cervical adenopathy.  Skin:    General: Skin is warm and dry.  Neurological:     Mental Status: She is alert and oriented to person, place, and time.     Cranial Nerves: No cranial nerve deficit.  Psychiatric:        Behavior: Behavior normal.        Thought Content: Thought content normal.        Judgment: Judgment normal.    Assessment/Plan: 1. Insomnia due to stress Pt given note to let apartment building management know, that she needs to move apartments so that she can not be disturbed by they noise from her upstairs neighbors.   2. Other fatigue Pt is fatigued from not being able to sleep.    3. Essential hypertension, benign Stable, continue current medications.   General Counseling: reia viernes understanding of the findings of todays visit and agrees with plan of treatment. I have discussed any further diagnostic evaluation that may be needed or ordered today. We also reviewed her medications today. she has been encouraged to call the office with any questions or concerns that should arise related to todays visit.    No orders of the defined types were placed in this encounter.   No orders of the defined types were placed in this encounter.   Time spent: 25 Minutes   This patient was seen by Orson Gear AGNP-C in Collaboration with Dr Lavera Guise as a part of collaborative care agreement     Kendell Bane AGNP-C Internal medicine

## 2018-07-13 ENCOUNTER — Other Ambulatory Visit: Payer: Self-pay | Admitting: Psychiatry

## 2018-07-19 ENCOUNTER — Encounter: Payer: Self-pay | Admitting: Psychiatry

## 2018-07-19 ENCOUNTER — Ambulatory Visit (INDEPENDENT_AMBULATORY_CARE_PROVIDER_SITE_OTHER): Payer: Medicare HMO | Admitting: Psychiatry

## 2018-07-19 ENCOUNTER — Other Ambulatory Visit: Payer: Self-pay

## 2018-07-19 VITALS — BP 99/62 | HR 98 | Temp 97.8°F | Wt 177.2 lb

## 2018-07-19 DIAGNOSIS — F5101 Primary insomnia: Secondary | ICD-10-CM | POA: Diagnosis not present

## 2018-07-19 DIAGNOSIS — F22 Delusional disorders: Secondary | ICD-10-CM | POA: Diagnosis not present

## 2018-07-19 NOTE — Progress Notes (Signed)
Stacey Boyd OP Progress Note  07/19/2018 1:03 PM Stacey Boyd  MRN:  [id number]  Chief Complaint: ' I am here for follow up." Chief Complaint    Follow-up; Medication Refill     HPI: Stacey Boyd is a 73 yr old Caucasian female, lives in [place], retired, presented to clinic today for a follow-up visit.  Patient has a history of delusional disorder, hypertension and hypothyroidism.  Patient today reports that she has been doing better than before.  She reports she continues to have some problem when she stays at her apartment.  She reports she has been staying at her apartment during the day.  She reports during the day she is okay and she does not have any problems at that time.  She reports however that she continues to feel her upstairs neighbors are out to get her.  She feels they are trying to disturb her at night.  She hence has been staying with her granddaughter during the night.  She reports ever since she started doing that she has been sleeping and the noises does not bother her.  She does not hear any voices or noises at her granddaughter's place.  Patient reports she is compliant on her medications like Seroquel and Ambien.  She denies any side effects to the medication.  Patient reports she is moving to [place] to stay with her niece.  She reports her niece is retired and hence she will have support.  She also reports she has brothers and sisters and a daughter  lives in [place].  She reports her brother and sister are retired and hence she will have social support  during the day.  She reports she wants to surround herself with her loved ones and she is hoping that will help her.  Patient is working on  transitioning care to a new provider there. Discussed with patient that she can be given refills as well as she needs to sign a release to send medical records to her new provider.  She agrees with plan.  Visit Diagnosis:    ICD-10-CM   1. Delusional disorder ([place]) F22   2. Primary  insomnia F51.01     Past Psychiatric History: Reviewed past psychiatric history from my progress note on 12/29/2017.  Past Medical History:  Past Medical History:  Diagnosis Date  . Allergy   . Arthritis   . Cancer ([place])    skin cancer on right shoulder.   . Diabetes mellitus without complication ([place])    Pt states she is diet controlled.   . Glaucoma   . Hyperlipidemia   . Hypertension   . Thyroid disease   . Wrist fracture 2001   right    Past Surgical History:  Procedure Laterality Date  . ABDOMINAL HYSTERECTOMY  2005  . APPENDECTOMY  1980  . CARDIAC CATHETERIZATION Left 09/17/2015   Procedure: Left Heart Cath and Coronary Angiography;  Surgeon: Teodoro Spray, Boyd;  Location: [place] [facility];  Service: Cardiovascular;  Laterality: Left;  . COLONOSCOPY WITH PROPOFOL N/A 11/07/2017   Procedure: COLONOSCOPY WITH PROPOFOL;  Surgeon: Jonathon Bellows, Boyd;  Location: [facility] ENDOSCOPY;  Service: Gastroenterology;  Laterality: N/A;  . FRACTURE SURGERY      Family Psychiatric History: Reviewed family psychiatric history from my progress note on 12/29/2017.  Family History:  Family History  Problem Relation Age of Onset  . Heart disease Mother        h/o rheumatic fever  . Heart disease Father   .  Stroke Father   . Diabetes Sister   . Diabetes Brother   . Diabetes Sister   . Mental illness Other   . Colon cancer Neg Hx   . Breast cancer Neg Hx     Social History: Reviewed social history from my progress note on 12/29/2017 Social History   Socioeconomic History  . Marital status: Divorced    Spouse name: Not on file  . Number of children: 3  . Years of education: Not on file  . Highest education level: 10th grade  Occupational History  . Not on file  Social Needs  . Financial resource strain: Not hard at all  . Food insecurity:    Worry: Never true    Inability: Never true  . Transportation needs:    Medical: No    Non-medical: No  Tobacco Use  . Smoking status:  Former Smoker    Last attempt to quit: 09/17/1979    Years since quitting: 38.8  . Smokeless tobacco: Never Used  Substance and Sexual Activity  . Alcohol use: No  . Drug use: No  . Sexual activity: Yes    Partners: Male    Birth control/protection: None  Lifestyle  . Physical activity:    Days per week: 0 days    Minutes per session: 0 min  . Stress: Not at all  Relationships  . Social connections:    Talks on phone: Not on file    Gets together: Not on file    Attends religious service: 1 to 4 times per year    Active member of club or organization: No    Attends meetings of clubs or organizations: Never    Relationship status: Divorced  Other Topics Concern  . Not on file  Social History Narrative   3 kids, local   Lives with daughter as of 2016   Divorced ~2002    Allergies:  Allergies  Allergen Reactions  . Codeine Hives and Nausea And Vomiting    Metabolic Disorder Labs: Lab Results  Component Value Date   HGBA1C 7.8 (A) 05/29/2018   MPG 134.11 10/10/2017   No results found for: PROLACTIN Lab Results  Component Value Date   CHOL 188 10/10/2017   TRIG 107 10/10/2017   HDL 43 10/10/2017   CHOLHDL 4.4 10/10/2017   VLDL 21 10/10/2017   LDLCALC 124 (H) 10/10/2017   LDLCALC 105 (H) 08/22/2017   Lab Results  Component Value Date   TSH 2.900 02/13/2018   TSH 5.610 (H) 11/14/2017    Therapeutic Level Labs: No results found for: LITHIUM No results found for: VALPROATE No components found for:  CBMZ  Current Medications: Current Outpatient Medications  Medication Sig Dispense Refill  . atorvastatin (LIPITOR) 10 MG tablet TAKE 1 TABLET (10 MG TOTAL) BY MOUTH DAILY. 90 tablet 1  . benztropine (COGENTIN) 0.5 MG tablet Take 1 tablet (0.5 mg total) by mouth daily as needed for tremors. For tremors 10 tablet 1  . brimonidine (ALPHAGAN) 0.2 % ophthalmic solution     . brinzolamide (AZOPT) 1 % ophthalmic suspension Place 1 drop into both eyes 2 (two) times  daily.    . Cholecalciferol (VITAMIN D3) 5000 units TABS Take 5,000 Units by mouth daily.     . dorzolamide-timolol (COSOPT) 22.3-6.8 MG/ML ophthalmic solution     . fluticasone (FLONASE) 50 MCG/ACT nasal spray Place 2 sprays into both nostrils at bedtime. 16 g 2  . ibuprofen (ADVIL,MOTRIN) 200 MG tablet Take 400 mg by mouth  every 6 (six) hours as needed for pain. Reported on 09/17/2015    . latanoprost (XALATAN) 0.005 % ophthalmic solution 1 drop nightly.    . levothyroxine (SYNTHROID, LEVOTHROID) 25 MCG tablet Take 1 tablet (25 mcg total) by mouth daily before breakfast. 30 tablet 3  . lisinopril-hydrochlorothiazide (PRINZIDE,ZESTORETIC) 20-12.5 MG tablet Take 1 tablet by mouth daily. MUST SCHEDULE ANNUAL EXAM FOR FURTHER REFILLS 90 tablet 0  . loratadine (CLARITIN) 10 MG tablet Take 1 tablet (10 mg total) by mouth daily. 30 tablet 1  . meloxicam (MOBIC) 7.5 MG tablet Take 1 tablet (7.5 mg total) by mouth 2 (two) times daily as needed for pain. 45 tablet 3  . metFORMIN (GLUCOPHAGE) 500 MG tablet Take 0.5 tablets (250 mg total) by mouth 2 (two) times daily with a meal. 30 tablet 3  . Multiple Vitamins-Minerals (PRESERVISION AREDS 2) CAPS Take 1 capsule by mouth daily.    . propranolol (INDERAL) 40 MG tablet Take 0.5 tablets (20 mg total) by mouth 2 (two) times daily. 60 tablet 1  . QUEtiapine (SEROQUEL) 100 MG tablet Take 1 tablet (100 mg total) by mouth at bedtime. For mood and psychosis 90 tablet 0  . rOPINIRole (REQUIP) 2 MG tablet TAKE 1 TABLET (2 MG TOTAL) BY MOUTH NIGHTLY. 90 tablet 1  . timolol (TIMOPTIC) 0.5 % ophthalmic solution INSTILL ONE DROP INTO BOTH EYES DAILY  3  . valACYclovir (VALTREX) 500 MG tablet TAKE 1 TABLET DAILYFOR COLD SORES 1 TAB TWICE A DAY FOR 3 DAYS IF OUTBREAK THEN BACK TO 1 TAB DAILY  11  . zolpidem (AMBIEN) 5 MG tablet TAKE ONE TABLET AT BEDTIME AS NEEDED FORSLEEP 30 tablet 0   No current facility-administered medications for this visit.       Musculoskeletal: Strength & Muscle Tone: within normal limits Gait & Station: normal Patient leans: N/A  Psychiatric Specialty Exam: Review of Systems  Psychiatric/Behavioral: Negative for hallucinations. The patient is nervous/anxious.   All other systems reviewed and are negative.   Blood pressure 99/62, pulse 98, temperature 97.8 F (36.6 C), temperature source Oral, weight 177 lb 3.2 oz (80.4 kg).Body mass index is 31.39 kg/m.  General Appearance: Casual  Eye Contact:  Fair  Speech:  Clear and Coherent  Volume:  Normal  Mood:  Anxious  Affect:  Appropriate  Thought Process:  Goal Directed and Descriptions of Associations: Intact  Orientation:  Full (Time, Place, and Person)  Thought Content: Delusions reports she thinks there are people trying to bother her from an upstairs apartment - but she does not have these thoughts when she is at a different location  Suicidal Thoughts:  No  Homicidal Thoughts:  No  Memory:  Immediate;   Fair Recent;   Fair Remote;   Fair  Judgement:  Fair  Insight:  Fair  Psychomotor Activity:  Normal  Concentration:  Concentration: Fair and Attention Span: Fair  Recall:  [employer] of Knowledge: Fair  Language: Fair  Akathisia:  No  Handed:  Right  AIMS (if indicated): denies tremors, rigidity,stiffness  Assets:  Communication Skills Desire for Improvement Housing Social Support  ADL's:  Intact  Cognition: WNL  Sleep:  Fair   Screenings: AIMS     Office Visit from 03/03/2018 in [place] Total Score  0    AUDIT     Admission (Discharged) from 10/10/2017 in [facility]  Alcohol Use Disorder Identification Test Final Score (AUDIT)  0    Mini-Mental  Clinical Support from 05/29/2018 in [facility], [facility]  Total Score (max 30 points )  30    PHQ2-9     Clinical Support from 05/29/2018 in [facility], [facility] Office Visit from [facility]/06/2018 in [facility], [facility] Office Visit from 03/27/2018 in [facility], [facility] Office Visit from 11/10/2017 in [facility], [facility] Office Visit from 08/16/2017 in [facility], [facility]  PHQ-2 Total Score  0  0  0  0  6  PHQ-9 Total Score  -  -  -  -  12       Assessment and Plan: Erva is a 73 year old Caucasian female who has a history of delusional disorder, hypertension, thyroid disease, presented to clinic today for a follow-up visit.  Patient reports she is currently making progress with regards to her psychosis and sleep problems.  She is moving to [place] and will transition her care to a new provider there.  Continue plan as noted below.  Plan For delusional disorder- improving Seroquel 100 mg p.o. nightly.  Patient has 90-day supply of medication filled on February 19.  For insomnia-improving Patient has Seroquel which helps with sleep. She also has Ambien 5 mg as needed.  Patient will limit the use of Ambien.  Discussed with patient to sign a release to send medical records to her new providers in [place].  I have spent atleast 15 minutes face to face with patient today. More than 50 % of the time was spent for psychoeducation and supportive psychotherapy and care coordination.  This note was generated in part or whole with voice recognition software. Voice recognition is usually quite accurate but there are transcription errors that can and very often do occur. I apologize for any typographical errors that were not detected and corrected.        Ursula Alert, Boyd 07/19/2018, 1:03 PM

## 2018-08-11 ENCOUNTER — Other Ambulatory Visit: Payer: Self-pay | Admitting: Psychiatry

## 2018-08-15 ENCOUNTER — Other Ambulatory Visit: Payer: Self-pay | Admitting: Psychiatry

## 2018-08-15 DIAGNOSIS — F5105 Insomnia due to other mental disorder: Secondary | ICD-10-CM

## 2018-08-15 NOTE — Telephone Encounter (Signed)
  pt called states she needs a refill on her medication zolpidem    zolpidem (AMBIEN) 5 MG tablet  Medication  Date: 07/14/2018 Department: [facility] Ordering/Authorizing: Ursula Alert, MD  Order Providers   Prescribing Provider Encounter Provider  Ursula Alert, MD Ursula Alert, MD  Outpatient Medication Detail    Disp Refills Start End   zolpidem (AMBIEN) 5 MG tablet 30 tablet 0 07/14/2018    Sig: TAKE ONE TABLET AT BEDTIME AS NEEDED FORSLEEP   Sent to pharmacy as: zolpidem (AMBIEN) 5 MG tablet   E-Prescribing Status: Receipt confirmed by pharmacy (07/14/2018 8:49 AM EST)

## 2018-08-16 NOTE — Telephone Encounter (Signed)
sent Ambien to pharmacy

## 2018-08-29 ENCOUNTER — Ambulatory Visit (INDEPENDENT_AMBULATORY_CARE_PROVIDER_SITE_OTHER): Payer: Medicare HMO | Admitting: Nurse Practitioner

## 2018-08-29 ENCOUNTER — Encounter: Payer: Self-pay | Admitting: Nurse Practitioner

## 2018-08-29 ENCOUNTER — Other Ambulatory Visit: Payer: Self-pay

## 2018-08-29 VITALS — Resp 16 | Ht 63.0 in | Wt 174.0 lb

## 2018-08-29 DIAGNOSIS — G2581 Restless legs syndrome: Secondary | ICD-10-CM | POA: Diagnosis not present

## 2018-08-29 DIAGNOSIS — I10 Essential (primary) hypertension: Secondary | ICD-10-CM | POA: Diagnosis not present

## 2018-08-29 DIAGNOSIS — E1165 Type 2 diabetes mellitus with hyperglycemia: Secondary | ICD-10-CM | POA: Diagnosis not present

## 2018-08-29 NOTE — Progress Notes (Signed)
[facility] [place], [place] [address]  Internal MEDICINE  Telephone Visit  Patient Name: Stacey Boyd  [id number]  [id number]  Date of Service: 09/13/2018  I connected with the patient at 10:58am by telephone and verified the patients identity using two identifiers.   I discussed the limitations, risks, security and privacy concerns of performing an evaluation and management service by telephone and the availability of in person appointments. I also discussed with the patient that there may be a patient responsible charge related to the service.  The patient expressed understanding and agrees to proceed.    Chief Complaint  Patient presents with  . Telephone Screen  . Diabetes    HAS NOT CHECKED BS   . Hypertension  . Osteoarthritis    WOULD LIKE TO DISCUSS WHETHER OR NOT SHE SHOULD CONTINUE WITH MOBIC   . Insomnia  . Telephone Assessment    The patient has been contacted via telephone for follow up visit due to concerns for spread of novel coronavirus. She states that she has no concerns or complaints. She takes her metformin twice daily. She does not check her blood sugars, but feels good. Continues to take blood pressure medications every day. Unable to monitor her pressure. States that when pressure has been elevated in the past, she gets headaches. States that she has not had a headache in some time. She does have some problems with arthritis bothering her. Will take ibuprofen from time to time and this helps a great deal.       Current Medication: Outpatient Encounter Medications as of 08/29/2018  Medication Sig  . atorvastatin (LIPITOR) 10 MG tablet TAKE 1 TABLET (10 MG TOTAL) BY MOUTH DAILY.  . benztropine (COGENTIN) 0.5 MG tablet Take 1 tablet (0.5 mg total) by mouth daily as needed for tremors. For tremors  . brinzolamide (AZOPT) 1 % ophthalmic suspension Place 1 drop into both eyes 2 (two) times daily.  . Cholecalciferol (VITAMIN D3) 5000  units TABS Take 5,000 Units by mouth daily.   . dorzolamide-timolol (COSOPT) 22.3-6.8 MG/ML ophthalmic solution   . fluticasone (FLONASE) 50 MCG/ACT nasal spray Place 2 sprays into both nostrils at bedtime.  Marland Kitchen ibuprofen (ADVIL,MOTRIN) 200 MG tablet Take 400 mg by mouth every 6 (six) hours as needed for pain. Reported on 09/17/2015  . latanoprost (XALATAN) 0.005 % ophthalmic solution 1 drop nightly.  . levothyroxine (SYNTHROID, LEVOTHROID) 25 MCG tablet Take 1 tablet (25 mcg total) by mouth daily before breakfast.  . lisinopril-hydrochlorothiazide (PRINZIDE,ZESTORETIC) 20-12.5 MG tablet Take 1 tablet by mouth daily. MUST SCHEDULE ANNUAL EXAM FOR FURTHER REFILLS  . loratadine (CLARITIN) 10 MG tablet Take 1 tablet (10 mg total) by mouth daily.  . metFORMIN (GLUCOPHAGE) 500 MG tablet Take 0.5 tablets (250 mg total) by mouth 2 (two) times daily with a meal.  . Multiple Vitamins-Minerals (PRESERVISION AREDS 2) CAPS Take 1 capsule by mouth daily.  . propranolol (INDERAL) 40 MG tablet Take 0.5 tablets (20 mg total) by mouth 2 (two) times daily.  . QUEtiapine (SEROQUEL) 100 MG tablet Take 1 tablet (100 mg total) by mouth at bedtime. For mood and psychosis  . rOPINIRole (REQUIP) 2 MG tablet TAKE 1 TABLET (2 MG TOTAL) BY MOUTH NIGHTLY.  . valACYclovir (VALTREX) 500 MG tablet TAKE 1 TABLET DAILYFOR COLD SORES 1 TAB TWICE A DAY FOR 3 DAYS IF OUTBREAK THEN BACK TO 1 TAB DAILY  . zolpidem (AMBIEN) 5 MG tablet TAKE ONE TABLET AT BEDTIME AS NEEDED  FORSLEEP  . [DISCONTINUED] meloxicam (MOBIC) 7.5 MG tablet Take 1 tablet (7.5 mg total) by mouth 2 (two) times daily as needed for pain.  . [DISCONTINUED] brimonidine (ALPHAGAN) 0.2 % ophthalmic solution   . [DISCONTINUED] timolol (TIMOPTIC) 0.5 % ophthalmic solution INSTILL ONE DROP INTO BOTH EYES DAILY   No facility-administered encounter medications on file as of 08/29/2018.     Surgical History: Past Surgical History:  Procedure Laterality Date  . ABDOMINAL  HYSTERECTOMY  2005  . APPENDECTOMY  1980  . CARDIAC CATHETERIZATION Left 09/17/2015   Procedure: Left Heart Cath and Coronary Angiography;  Surgeon: Teodoro Spray, MD;  Location: [place] CV LAB;  Service: Cardiovascular;  Laterality: Left;  . COLONOSCOPY WITH PROPOFOL N/A 11/07/2017   Procedure: COLONOSCOPY WITH PROPOFOL;  Surgeon: Jonathon Bellows, MD;  Location: [facility] ENDOSCOPY;  Service: Gastroenterology;  Laterality: N/A;  . FRACTURE SURGERY      Medical History: Past Medical History:  Diagnosis Date  . Allergy   . Arthritis   . Cancer ([place])    skin cancer on right shoulder.   . Diabetes mellitus without complication ([place])    Pt states she is diet controlled.   . Glaucoma   . Hyperlipidemia   . Hypertension   . Thyroid disease   . Wrist fracture 2001   right    Family History: Family History  Problem Relation Age of Onset  . Heart disease Mother        h/o rheumatic fever  . Heart disease Father   . Stroke Father   . Diabetes Sister   . Diabetes Brother   . Diabetes Sister   . Mental illness Other   . Colon cancer Neg Hx   . Breast cancer Neg Hx     Social History   Socioeconomic History  . Marital status: Divorced    Spouse name: Not on file  . Number of children: 3  . Years of education: Not on file  . Highest education level: 10th grade  Occupational History  . Not on file  Social Needs  . Financial resource strain: Not hard at all  . Food insecurity:    Worry: Never true    Inability: Never true  . Transportation needs:    Medical: No    Non-medical: No  Tobacco Use  . Smoking status: Former Smoker    Last attempt to quit: 09/17/1979    Years since quitting: 39.0  . Smokeless tobacco: Never Used  Substance and Sexual Activity  . Alcohol use: No  . Drug use: No  . Sexual activity: Yes    Partners: Male    Birth control/protection: None  Lifestyle  . Physical activity:    Days per week: 0 days    Minutes per session: 0 min  . Stress: Not at  all  Relationships  . Social connections:    Talks on phone: Not on file    Gets together: Not on file    Attends religious service: 1 to 4 times per year    Active member of club or organization: No    Attends meetings of clubs or organizations: Never    Relationship status: Divorced  . Intimate partner violence:    Fear of current or ex partner: No    Emotionally abused: No    Physically abused: No    Forced sexual activity: No  Other Topics Concern  . Not on file  Social History Narrative   3 kids, local  Lives with daughter as of 2016   Divorced ~2002      Review of Systems  Constitutional: Positive for fatigue. Negative for activity change, chills and unexpected weight change.  HENT: Negative for congestion, postnasal drip, rhinorrhea, sneezing and sore throat.   Respiratory: Negative for cough, chest tightness, shortness of breath and wheezing.   Cardiovascular: Negative for chest pain and palpitations.  Gastrointestinal: Negative for abdominal pain, constipation, diarrhea, nausea and vomiting.  Endocrine: Negative for cold intolerance, heat intolerance, polydipsia and polyuria.       Blood sugars improved.   Musculoskeletal: Positive for arthralgias and back pain. Negative for joint swelling and neck pain.       Generalized joint pain which is worse after she has been sitting or standing still for a little while. Gets better as she moves around   Skin: Negative for rash.  Allergic/Immunologic: Negative for environmental allergies.  Neurological: Positive for weakness. Negative for dizziness, tremors, light-headedness, numbness and headaches.  Hematological: Negative for adenopathy. Does not bruise/bleed easily.  Psychiatric/Behavioral: Positive for dysphoric mood. Negative for behavioral problems (Depression), sleep disturbance and suicidal ideas. The patient is nervous/anxious.     Today's Vitals   08/29/18 0919  Resp: 16  Weight: 174 lb (78.9 kg)  Height: 5\' 3"   (1.6 m)   Body mass index is 30.82 kg/m.  Observation/Objective:  The patient is alert and oriented. She answers all questions appropriately. Breathing is non-labored. She is in no acute distress.    Assessment/Plan: 1. Uncontrolled type 2 diabetes mellitus with hyperglycemia ([place]) contineu diabetic medication as prescribed. Will check HgbA1c at next visit.   2. Essential hypertension, benign Generally stable. Continue bp medication as prescribed   3. Restless leg syndrome Continue requip as prescribed   General Counseling: lougenia morrissey understanding of the findings of today's phone visit and agrees with plan of treatment. I have discussed any further diagnostic evaluation that may be needed or ordered today. We also reviewed her medications today. she has been encouraged to call the office with any questions or concerns that should arise related to todays visit.   Hypertension Counseling:   The following hypertensive lifestyle modification were recommended and discussed:  1. Limiting alcohol intake to less than 1 oz/day of ethanol:(24 oz of beer or 8 oz of wine or 2 oz of 100-proof whiskey). 2. Take baby ASA 81 mg daily. 3. Importance of regular aerobic exercise and losing weight. 4. Reduce dietary saturated fat and cholesterol intake for overall cardiovascular health. 5. Maintaining adequate dietary potassium, calcium, and magnesium intake. 6. Regular monitoring of the blood pressure. 7. Reduce sodium intake to less than 100 mmol/day (less than 2.3 gm of sodium or less than 6 gm of sodium choride)    Time spent: 25 Minutes    Dr Lavera Guise Internal medicine

## 2018-09-20 ENCOUNTER — Ambulatory Visit (INDEPENDENT_AMBULATORY_CARE_PROVIDER_SITE_OTHER): Payer: Medicare HMO | Admitting: Psychiatry

## 2018-09-20 ENCOUNTER — Encounter: Payer: Self-pay | Admitting: Psychiatry

## 2018-09-20 ENCOUNTER — Other Ambulatory Visit: Payer: Self-pay

## 2018-09-20 ENCOUNTER — Ambulatory Visit: Payer: Medicare HMO | Admitting: Psychiatry

## 2018-09-20 DIAGNOSIS — F22 Delusional disorders: Secondary | ICD-10-CM

## 2018-09-20 DIAGNOSIS — F5101 Primary insomnia: Secondary | ICD-10-CM | POA: Diagnosis not present

## 2018-09-20 MED ORDER — QUETIAPINE FUMARATE 100 MG PO TABS: 100.0000 mg | ORAL_TABLET | Freq: Every day | ORAL | 0 refills | Status: DC

## 2018-09-20 NOTE — Progress Notes (Signed)
Virtual Visit via Telephone Note  I connected with Stacey Boyd on 09/20/18 at  3:15 PM EDT by telephone and verified that I am speaking with the correct person using two identifiers.   I discussed the limitations, risks, security and privacy concerns of performing an evaluation and management service by telephone and the availability of in person appointments. I also discussed with the patient that there may be a patient responsible charge related to this service. The patient expressed understanding and agreed to proceed.    I discussed the assessment and treatment plan with the patient. The patient was provided an opportunity to ask questions and all were answered. The patient agreed with the plan and demonstrated an understanding of the instructions.   The patient was advised to call back or seek an in-person evaluation if the symptoms worsen or if the condition fails to improve as anticipated.   [place] MD OP Progress Note  09/20/2018 5:32 PM Stacey Boyd  MRN:  [id number]  Chief Complaint:  Chief Complaint    Follow-up     HPI: Stacey Boyd is a 73 year old Caucasian female, lives in [place], retired, was evaluated by phone today.  Patient has a history of delusional disorder, insomnia, hypertension and hypothyroidism.  Patient was offered a video consult however declined.  Patient today reports she currently stays with her granddaughter Otho Ket.  She was not able to go to [place] since she felt it was not suitable for her.  She reports she currently feels good with her granddaughter.  She has not had any delusions, auditory hallucinations or any other perceptual disturbances.  She reports her mood is fair.  She reports sleep is good.  She continues to take Seroquel as well as Ambien.  She denies any side effects to the medications.  She denies any other concerns today.  She appeared to be alert, oriented to person place and situation. Visit Diagnosis:    ICD-10-CM   1. Delusional  disorder (HCC) F22 QUEtiapine (SEROQUEL) 100 MG tablet   in remission  2. Primary insomnia F51.01     Past Psychiatric History: Reviewed past psychiatric history from my progress note on 12/29/2017.  Past Medical History:  Past Medical History:  Diagnosis Date  . Allergy   . Arthritis   . Cancer ([place])    skin cancer on right shoulder.   . Diabetes mellitus without complication ([place])    Pt states she is diet controlled.   . Glaucoma   . Hyperlipidemia   . Hypertension   . Thyroid disease   . Wrist fracture 2001   right    Past Surgical History:  Procedure Laterality Date  . ABDOMINAL HYSTERECTOMY  2005  . APPENDECTOMY  1980  . CARDIAC CATHETERIZATION Left 09/17/2015   Procedure: Left Heart Cath and Coronary Angiography;  Surgeon: Teodoro Spray, MD;  Location: [facility];  Service: Cardiovascular;  Laterality: Left;  . COLONOSCOPY WITH PROPOFOL N/A 11/07/2017   Procedure: COLONOSCOPY WITH PROPOFOL;  Surgeon: Jonathon Bellows, MD;  Location: [facility] END[facility];  Service: Gastroenterology;  Laterality: N/A;  . FRACTURE SURGERY      Family Psychiatric History: Reviewed family psychiatric history from my progress note on 12/29/2017.  Family History:  Family History  Problem Relation Age of Onset  . Heart disease Mother        h/o rheumatic fever  . Heart disease Father   . Stroke Father   . Diabetes Sister   . Diabetes Brother   . Diabetes  Sister   . Mental illness Other   . Colon cancer Neg Hx   . Breast cancer Neg Hx     Social History: Reviewed social history from my progress note on 12/29/2017. Social History   Socioeconomic History  . Marital status: Divorced    Spouse name: Not on file  . Number of children: 3  . Years of education: Not on file  . Highest education level: 10th grade  Occupational History  . Not on file  Social Needs  . Financial resource strain: Not hard at all  . Food insecurity:    Worry: Never true    Inability: Never true  .  Transportation needs:    Medical: No    Non-medical: No  Tobacco Use  . Smoking status: Former Smoker    Last attempt to quit: 09/17/1979    Years since quitting: 39.0  . Smokeless tobacco: Never Used  Substance and Sexual Activity  . Alcohol use: No  . Drug use: No  . Sexual activity: Yes    Partners: Male    Birth control/protection: None  Lifestyle  . Physical activity:    Days per week: 0 days    Minutes per session: 0 min  . Stress: Not at all  Relationships  . Social connections:    Talks on phone: Not on file    Gets together: Not on file    Attends religious service: 1 to 4 times per year    Active member of club or organization: No    Attends meetings of clubs or organizations: Never    Relationship status: Divorced  Other Topics Concern  . Not on file  Social History Narrative   3 kids, local   Lives with daughter as of 2016   Divorced ~2002    Allergies:  Allergies  Allergen Reactions  . Codeine Hives and Nausea And Vomiting    Metabolic Disorder Labs: Lab Results  Component Value Date   HGBA1C 7.8 (A) 05/29/2018   MPG 134.11 10/10/2017   No results found for: PROLACTIN Lab Results  Component Value Date   CHOL 188 10/10/2017   TRIG 107 10/10/2017   HDL 43 10/10/2017   CHOLHDL 4.4 10/10/2017   VLDL 21 10/10/2017   LDLCALC 124 (H) 10/10/2017   LDLCALC 105 (H) 08/22/2017   Lab Results  Component Value Date   TSH 2.900 02/13/2018   TSH 5.610 (H) 11/14/2017    Therapeutic Level Labs: No results found for: LITHIUM No results found for: VALPROATE No components found for:  CBMZ  Current Medications: Current Outpatient Medications  Medication Sig Dispense Refill  . atorvastatin (LIPITOR) 10 MG tablet TAKE 1 TABLET (10 MG TOTAL) BY MOUTH DAILY. 90 tablet 1  . benztropine (COGENTIN) 0.5 MG tablet Take 1 tablet (0.5 mg total) by mouth daily as needed for tremors. For tremors 10 tablet 1  . brinzolamide (AZOPT) 1 % ophthalmic suspension Place 1  drop into both eyes 2 (two) times daily.    . Cholecalciferol (VITAMIN D3) 5000 units TABS Take 5,000 Units by mouth daily.     . dorzolamide-timolol (COSOPT) 22.3-6.8 MG/ML ophthalmic solution     . fluticasone (FLONASE) 50 MCG/ACT nasal spray Place 2 sprays into both nostrils at bedtime. 16 g 2  . ibuprofen (ADVIL,MOTRIN) 200 MG tablet Take 400 mg by mouth every 6 (six) hours as needed for pain. Reported on 09/17/2015    . latanoprost (XALATAN) 0.005 % ophthalmic solution 1 drop nightly.    Marland Kitchen  levothyroxine (SYNTHROID, LEVOTHROID) 25 MCG tablet Take 1 tablet (25 mcg total) by mouth daily before breakfast. 30 tablet 3  . lisinopril-hydrochlorothiazide (PRINZIDE,ZESTORETIC) 20-12.5 MG tablet Take 1 tablet by mouth daily. MUST SCHEDULE ANNUAL EXAM FOR FURTHER REFILLS 90 tablet 0  . loratadine (CLARITIN) 10 MG tablet Take 1 tablet (10 mg total) by mouth daily. 30 tablet 1  . metFORMIN (GLUCOPHAGE) 500 MG tablet Take 0.5 tablets (250 mg total) by mouth 2 (two) times daily with a meal. 30 tablet 3  . Multiple Vitamins-Minerals (PRESERVISION AREDS 2) CAPS Take 1 capsule by mouth daily.    . propranolol (INDERAL) 40 MG tablet Take 0.5 tablets (20 mg total) by mouth 2 (two) times daily. 60 tablet 1  . QUEtiapine (SEROQUEL) 100 MG tablet Take 1 tablet (100 mg total) by mouth at bedtime. For mood and psychosis 90 tablet 0  . rOPINIRole (REQUIP) 2 MG tablet TAKE 1 TABLET (2 MG TOTAL) BY MOUTH NIGHTLY. 90 tablet 1  . valACYclovir (VALTREX) 500 MG tablet TAKE 1 TABLET DAILYFOR COLD SORES 1 TAB TWICE A DAY FOR 3 DAYS IF OUTBREAK THEN BACK TO 1 TAB DAILY  11  . zolpidem (AMBIEN) 5 MG tablet TAKE ONE TABLET AT BEDTIME AS NEEDED FORSLEEP 30 tablet 2   No current facility-administered medications for this visit.      Musculoskeletal: Strength & Muscle Tone: UTA Gait & Station: UTA Patient leans: N/A  Psychiatric Specialty Exam: Review of Systems  Psychiatric/Behavioral: Negative for depression. The patient  is not nervous/anxious.   All other systems reviewed and are negative.   There were no vitals taken for this visit.There is no height or weight on file to calculate BMI.  General Appearance: UTA  Eye Contact:  UTA  Speech:  Normal Rate  Volume:  Normal  Mood:  Euthymic  Affect:  UTA  Thought Process:  Goal Directed and Descriptions of Associations: Intact  Orientation:  Full (Time, Place, and Person)  Thought Content: Logical   Suicidal Thoughts:  No  Homicidal Thoughts:  No  Memory:  Immediate;   Fair Recent;   Fair Remote;   Fair  Judgement:  Fair  Insight:  Fair  Psychomotor Activity:  UTA  Concentration:  Concentration: Fair and Attention Span: Fair  Recall:  [employer] of Knowledge: Fair  Language: Fair  Akathisia:  No  Handed:  Right  AIMS (if indicated): Denies rigidity,stiffness  Assets:  Communication Skills Desire for Improvement Housing Social Support  ADL's:  Intact  Cognition: WNL  Sleep:  Fair   Screenings: AIMS     Office Visit from 03/03/2018 in [place] Total Score  0    AUDIT     Admission (Discharged) from 10/10/2017 in [place]  Alcohol Use Disorder Identification Test Final Score (AUDIT)  0    Mini-Mental     Clinical Support from 05/29/2018 in [facility], [facility]  Total Score (max 30 points )  30    PHQ2-9     Office Visit from 08/29/2018 in [facility] [place] from 05/29/2018 in [facility], [facility] Office Visit from 04/06/2018 in [facility], [facility] Office Visit from 03/27/2018 in [facility], [facility] Office Visit from 11/10/2017 in [facility], [facility]  PHQ-2 Total Score  0  0  0  0  0       Assessment and Plan: Stacey Boyd is a 73 year old Caucasian female who has a history of  delusional disorder, insomnia, hypertension, thyroid disease was evaluated by phone today.  Patient was offered a video consult however  declined.  Patient is currently doing well on the current medication regimen.  Plan as noted below.  Plan For delusional disorder- stable Continue Seroquel 100 mg p.o. nightly.   For insomnia- stable Seroquel as prescribed Ambien 5 mg p.o. nightly as needed  I have reviewed [place] controlled substance database.  Follow-up in clinic in 2 to 3 months or sooner if needed.  I have spent atleast 15 minutes non  face to face with patient today. More than 50 % of the time was spent for psychoeducation and supportive psychotherapy and care coordination.  This note was generated in part or whole with voice recognition software. Voice recognition is usually quite accurate but there are transcription errors that can and very often do occur. I apologize for any typographical errors that were not detected and corrected.       Ursula Alert, MD 09/21/2018, 8:57 AM

## 2018-10-02 ENCOUNTER — Other Ambulatory Visit: Payer: Self-pay

## 2018-10-02 DIAGNOSIS — E1165 Type 2 diabetes mellitus with hyperglycemia: Secondary | ICD-10-CM

## 2018-10-02 MED ORDER — METFORMIN HCL 500 MG PO TABS: 250.0000 mg | ORAL_TABLET | Freq: Two times a day (BID) | ORAL | 3 refills | Status: DC

## 2018-10-11 DIAGNOSIS — H401132 Primary open-angle glaucoma, bilateral, moderate stage: Secondary | ICD-10-CM | POA: Diagnosis not present

## 2018-10-31 ENCOUNTER — Ambulatory Visit
Admission: RE | Admit: 2018-10-31 | Discharge: 2018-10-31 | Disposition: A | Discharge status: Home / Self Care | Payer: Medicare HMO | Source: Ambulatory Visit | Attending: Nurse Practitioner | Admitting: Nurse Practitioner

## 2018-10-31 ENCOUNTER — Encounter: Payer: Self-pay | Admitting: Nurse Practitioner

## 2018-10-31 ENCOUNTER — Ambulatory Visit (INDEPENDENT_AMBULATORY_CARE_PROVIDER_SITE_OTHER): Payer: Medicare HMO | Admitting: Nurse Practitioner

## 2018-10-31 ENCOUNTER — Other Ambulatory Visit: Payer: Self-pay

## 2018-10-31 VITALS — BP 122/56 | HR 67 | Resp 16 | Ht 63.0 in | Wt 175.0 lb

## 2018-10-31 DIAGNOSIS — G8929 Other chronic pain: Secondary | ICD-10-CM | POA: Diagnosis not present

## 2018-10-31 DIAGNOSIS — M25512 Pain in left shoulder: Secondary | ICD-10-CM

## 2018-10-31 DIAGNOSIS — E1165 Type 2 diabetes mellitus with hyperglycemia: Secondary | ICD-10-CM | POA: Diagnosis not present

## 2018-10-31 DIAGNOSIS — G2581 Restless legs syndrome: Secondary | ICD-10-CM | POA: Diagnosis not present

## 2018-10-31 DIAGNOSIS — B001 Herpesviral vesicular dermatitis: Secondary | ICD-10-CM | POA: Insufficient documentation

## 2018-10-31 DIAGNOSIS — E7800 Pure hypercholesterolemia, unspecified: Secondary | ICD-10-CM | POA: Diagnosis not present

## 2018-10-31 DIAGNOSIS — E039 Hypothyroidism, unspecified: Secondary | ICD-10-CM

## 2018-10-31 DIAGNOSIS — I10 Essential (primary) hypertension: Secondary | ICD-10-CM

## 2018-10-31 LAB — POCT GLYCOSYLATED HEMOGLOBIN (HGB A1C): Hemoglobin A1C: 8 % — AB (ref 4.0–5.6)

## 2018-10-31 MED ORDER — METFORMIN HCL 500 MG PO TABS: 500.0000 mg | ORAL_TABLET | Freq: Two times a day (BID) | ORAL | 1 refills | Status: DC

## 2018-10-31 MED ORDER — IBUPROFEN 800 MG PO TABS: ORAL_TABLET | ORAL | 1 refills | Status: DC

## 2018-10-31 MED ORDER — ROPINIROLE HCL 2 MG PO TABS: ORAL_TABLET | ORAL | 1 refills | Status: DC

## 2018-10-31 MED ORDER — ATORVASTATIN CALCIUM 10 MG PO TABS: ORAL_TABLET | ORAL | 1 refills | Status: DC

## 2018-10-31 MED ORDER — LEVOTHYROXINE SODIUM 25 MCG PO TABS: 25.0000 ug | ORAL_TABLET | Freq: Every day | ORAL | 1 refills | Status: DC

## 2018-10-31 MED ORDER — LISINOPRIL-HYDROCHLOROTHIAZIDE 20-12.5 MG PO TABS: 1.0000 | ORAL_TABLET | Freq: Every day | ORAL | 1 refills | Status: DC

## 2018-10-31 MED ORDER — VALACYCLOVIR HCL 500 MG PO TABS: 500.0000 mg | ORAL_TABLET | Freq: Every day | ORAL | 1 refills | Status: DC

## 2018-10-31 NOTE — Progress Notes (Signed)
[facility] [place], [place] [address]  Internal MEDICINE  Office Visit Note  Patient Name: Stacey Boyd  [id number]  [id number]  Date of Service: 10/31/2018  Chief Complaint  Patient presents with  . Medical Management of Chronic Issues  . Diabetes  . Hyperlipidemia  . Hypertension    The patient is here for follow up exam. Today, she states that she is having pain in left shoulder. Hurts at the joint. Hurts to raise her arm over her head or to bend the arm behind her back. Sometimes, she gets a piercing pain in the shoulder which radiates down the arm and all the way into the left hand. Has been going on for past few months and pain has gradually been gating worse. Pain is with her constantly. She has tried taking ibuprofen and states that this has not really helped at all.  The patient's blood sugar is elevated recently. She currently takes metformin 500mg , 1/2 tablet twice daily. She has been taking care of her 74 year old great grand daughter. When staying with her, she is eating a lot of sandwiches and she knows that bread is her enemy right now.       Current Medication: Outpatient Encounter Medications as of 10/31/2018  Medication Sig Note  . atorvastatin (LIPITOR) 10 MG tablet TAKE 1 TABLET (10 MG TOTAL) BY MOUTH DAILY.   . brinzolamide (AZOPT) 1 % ophthalmic suspension Place 1 drop into both eyes 2 (two) times daily.   . Cholecalciferol (VITAMIN D3) 5000 units TABS Take 5,000 Units by mouth daily.    . dorzolamide-timolol (COSOPT) 22.3-6.8 MG/ML ophthalmic solution Place 1 drop into both eyes 2 (two) times daily.    . fexofenadine (ALLEGRA) 180 MG tablet Take 180 mg by mouth daily.   . fluticasone (FLONASE) 50 MCG/ACT nasal spray Place 2 sprays into both nostrils at bedtime.   Marland Kitchen latanoprost (XALATAN) 0.005 % ophthalmic solution 1 drop nightly.   . levothyroxine (SYNTHROID) 25 MCG tablet Take 1 tablet (25 mcg total) by mouth daily before  breakfast.   . lisinopril-hydrochlorothiazide (ZESTORETIC) 20-12.5 MG tablet Take 1 tablet by mouth daily. MUST SCHEDULE ANNUAL EXAM FOR FURTHER REFILLS   . metFORMIN (GLUCOPHAGE) 500 MG tablet Take 1 tablet (500 mg total) by mouth 2 (two) times daily with a meal.   . propranolol (INDERAL) 40 MG tablet Take 0.5 tablets (20 mg total) by mouth 2 (two) times daily.   . QUEtiapine (SEROQUEL) 100 MG tablet Take 1 tablet (100 mg total) by mouth at bedtime. For mood and psychosis   . rOPINIRole (REQUIP) 2 MG tablet TAKE 1 TABLET (2 MG TOTAL) BY MOUTH NIGHTLY.   . valACYclovir (VALTREX) 500 MG tablet Take 1 tablet (500 mg total) by mouth daily.   Marland Kitchen zolpidem (AMBIEN) 5 MG tablet TAKE ONE TABLET AT BEDTIME AS NEEDED FORSLEEP   . [DISCONTINUED] atorvastatin (LIPITOR) 10 MG tablet TAKE 1 TABLET (10 MG TOTAL) BY MOUTH DAILY.   . [DISCONTINUED] ibuprofen (ADVIL,MOTRIN) 200 MG tablet Take 400 mg by mouth every 6 (six) hours as needed for pain. Reported on 09/17/2015 10/31/2018: increased dose   . [DISCONTINUED] levothyroxine (SYNTHROID, LEVOTHROID) 25 MCG tablet Take 1 tablet (25 mcg total) by mouth daily before breakfast.   . [DISCONTINUED] lisinopril-hydrochlorothiazide (PRINZIDE,ZESTORETIC) 20-12.5 MG tablet Take 1 tablet by mouth daily. MUST SCHEDULE ANNUAL EXAM FOR FURTHER REFILLS   . [DISCONTINUED] metFORMIN (GLUCOPHAGE) 500 MG tablet Take 0.5 tablets (250 mg total) by mouth 2 (  two) times daily with a meal.   . [DISCONTINUED] rOPINIRole (REQUIP) 2 MG tablet TAKE 1 TABLET (2 MG TOTAL) BY MOUTH NIGHTLY.   . [DISCONTINUED] valACYclovir (VALTREX) 500 MG tablet TAKE 1 TABLET DAILYFOR COLD SORES 1 TAB TWICE A DAY FOR 3 DAYS IF OUTBREAK THEN BACK TO 1 TAB DAILY   . ibuprofen (ADVIL) 800 MG tablet Take 1 tablet po two to three times daily as needed for pain.   . [DISCONTINUED] benztropine (COGENTIN) 0.5 MG tablet Take 1 tablet (0.5 mg total) by mouth daily as needed for tremors. For tremors (Patient not taking: Reported  on 10/31/2018)   . [DISCONTINUED] loratadine (CLARITIN) 10 MG tablet Take 1 tablet (10 mg total) by mouth daily.   . [DISCONTINUED] Multiple Vitamins-Minerals (PRESERVISION AREDS 2) CAPS Take 1 capsule by mouth daily.    No facility-administered encounter medications on file as of 10/31/2018.     Surgical History: Past Surgical History:  Procedure Laterality Date  . ABDOMINAL HYSTERECTOMY  2005  . APPENDECTOMY  1980  . CARDIAC CATHETERIZATION Left 09/17/2015   Procedure: Left Heart Cath and Coronary Angiography;  Surgeon: Teodoro Spray, MD;  Location: [facility];  Service: Cardiovascular;  Laterality: Left;  . COLONOSCOPY WITH PROPOFOL N/A 11/07/2017   Procedure: COLONOSCOPY WITH PROPOFOL;  Surgeon: Jonathon Bellows, MD;  Location: [facility] ENDOSCOPY;  Service: Gastroenterology;  Laterality: N/A;  . FRACTURE SURGERY      Medical History: Past Medical History:  Diagnosis Date  . Allergy   . Arthritis   . Cancer ([place])    skin cancer on right shoulder.   . Diabetes mellitus without complication ([place])    Pt states she is diet controlled.   . Glaucoma   . Hyperlipidemia   . Hypertension   . Thyroid disease   . Wrist fracture 2001   right    Family History: Family History  Problem Relation Age of Onset  . Heart disease Mother        h/o rheumatic fever  . Heart disease Father   . Stroke Father   . Diabetes Sister   . Diabetes Brother   . Diabetes Sister   . Mental illness Other   . Colon cancer Neg Hx   . Breast cancer Neg Hx     Social History   Socioeconomic History  . Marital status: Divorced    Spouse name: Not on file  . Number of children: 3  . Years of education: Not on file  . Highest education level: 10th grade  Occupational History  . Not on file  Social Needs  . Financial resource strain: Not hard at all  . Food insecurity    Worry: Never true    Inability: Never true  . Transportation needs    Medical: No    Non-medical: No  Tobacco Use  .  Smoking status: Former Smoker    Quit date: 09/17/1979    Years since quitting: 39.1  . Smokeless tobacco: Never Used  Substance and Sexual Activity  . Alcohol use: No  . Drug use: No  . Sexual activity: Yes    Partners: Male    Birth control/protection: None  Lifestyle  . Physical activity    Days per week: 0 days    Minutes per session: 0 min  . Stress: Not at all  Relationships  . Social Herbalist on phone: Not on file    Gets together: Not on file    Attends  religious service: 1 to 4 times per year    Active member of club or organization: No    Attends meetings of clubs or organizations: Never    Relationship status: Divorced  . Intimate partner violence    Fear of current or ex partner: No    Emotionally abused: No    Physically abused: No    Forced sexual activity: No  Other Topics Concern  . Not on file  Social History Narrative   3 kids, local   Lives with daughter as of 2016   Divorced ~2002      Review of Systems  Constitutional: Negative for activity change, chills, fatigue and unexpected weight change.  HENT: Negative for congestion, postnasal drip, rhinorrhea, sneezing and sore throat.   Respiratory: Negative for cough, chest tightness, shortness of breath and wheezing.   Cardiovascular: Negative for chest pain and palpitations.  Gastrointestinal: Negative for abdominal pain, constipation, diarrhea, nausea and vomiting.  Endocrine: Negative for cold intolerance, heat intolerance, polydipsia and polyuria.       Blood sugars elevated over last few months.   Musculoskeletal: Positive for arthralgias and back pain. Negative for joint swelling and neck pain.       Increased pain in left shoulder. Hurts to raise her arm above her head or to reach behind the back.   Skin: Negative for rash.  Allergic/Immunologic: Negative for environmental allergies.  Neurological: Positive for weakness. Negative for dizziness, tremors, light-headedness, numbness and  headaches.  Hematological: Negative for adenopathy. Does not bruise/bleed easily.  Psychiatric/Behavioral: Positive for dysphoric mood. Negative for behavioral problems (Depression), sleep disturbance and suicidal ideas. The patient is nervous/anxious.    Today's Vitals   10/31/18 0900  BP: (!) 122/56  Pulse: 67  Resp: 16  SpO2: 98%  Weight: 175 lb (79.4 kg)  Height: 5\' 3"  (1.6 m)   Body mass index is 31 kg/m.  Physical Exam Vitals signs and nursing note reviewed.  Constitutional:      General: She is not in acute distress.    Appearance: She is well-developed. She is not diaphoretic.  HENT:     Head: Normocephalic and atraumatic.     Mouth/Throat:     Pharynx: No oropharyngeal exudate.  Eyes:     Pupils: Pupils are equal, round, and reactive to light.  Neck:     Musculoskeletal: Normal range of motion and neck supple.     Thyroid: No thyromegaly.     Vascular: No JVD.     Trachea: No tracheal deviation.  Cardiovascular:     Rate and Rhythm: Normal rate and regular rhythm.     Heart sounds: Normal heart sounds. No murmur. No friction rub. No gallop.   Pulmonary:     Effort: Pulmonary effort is normal. No respiratory distress.     Breath sounds: No wheezing or rales.  Chest:     Chest wall: No tenderness.  Abdominal:     General: Bowel sounds are normal.     Palpations: Abdomen is soft.  Musculoskeletal: Normal range of motion.     Comments: There is moderate left shoulder pain at left AC joint. Patient unable to horizontally lift her left arm to 75 degrees and unable to reach behind her back at all. There is no swelling or bony abnormality noted today.   Lymphadenopathy:     Cervical: No cervical adenopathy.  Skin:    General: Skin is warm and dry.  Neurological:     Mental Status: She is alert  and oriented to person, place, and time.     Cranial Nerves: No cranial nerve deficit.  Psychiatric:        Behavior: Behavior normal.        Thought Content: Thought  content normal.        Judgment: Judgment normal.    Assessment/Plan: 1. Uncontrolled type 2 diabetes mellitus with hyperglycemia (HCC) - POCT HgB A1C 8.0 today. Increase metformin to 500mg  twice daily. Encouraged her to limit intake of carbohydrates and sugar in the diet and to continue with increased amounts of exercise  - metFORMIN (GLUCOPHAGE) 500 MG tablet; Take 1 tablet (500 mg total) by mouth 2 (two) times daily with a meal.  Dispense: 180 tablet; Refill: 1  2. Chronic left shoulder pain Ibuprofen 800mg  tablets may be taken two to three times daily as needed for pain/inflammation. Will send for x-ray of left shoulder. Refer to orthopedics as indicated.  - DG Shoulder Left; Future - ibuprofen (ADVIL) 800 MG tablet; Take 1 tablet po two to three times daily as needed for pain.  Dispense: 60 tablet; Refill: 1  3. Essential hypertension, benign Stable. Continue bp medication as prescribed  - lisinopril-hydrochlorothiazide (ZESTORETIC) 20-12.5 MG tablet; Take 1 tablet by mouth daily. MUST SCHEDULE ANNUAL EXAM FOR FURTHER REFILLS  Dispense: 90 tablet; Refill: 1  4. Acquired hypothyroidism Stable. Continue levothyroxine as prescribed  - levothyroxine (SYNTHROID) 25 MCG tablet; Take 1 tablet (25 mcg total) by mouth daily before breakfast.  Dispense: 90 tablet; Refill: 1  5. Pure hypercholesterolemia - atorvastatin (LIPITOR) 10 MG tablet; TAKE 1 TABLET (10 MG TOTAL) BY MOUTH DAILY.  Dispense: 90 tablet; Refill: 1  6. Restless leg syndrome Stable.  - rOPINIRole (REQUIP) 2 MG tablet; TAKE 1 TABLET (2 MG TOTAL) BY MOUTH NIGHTLY.  Dispense: 90 tablet; Refill: 1  7. Recurrent cold sores - valACYclovir (VALTREX) 500 MG tablet; Take 1 tablet (500 mg total) by mouth daily.  Dispense: 90 tablet; Refill: 1  General Counseling: Amrita verbalizes understanding of the findings of todays visit and agrees with plan of treatment. I have discussed any further diagnostic evaluation that may be needed or  ordered today. We also reviewed her medications today. she has been encouraged to call the office with any questions or concerns that should arise related to todays visit.  Diabetes Counseling:  1. Addition of ACE inh/ ARB'S for nephroprotection. Microalbumin is updated  2. Diabetic foot care, prevention of complications. Podiatry consult 3. Exercise and lose weight.  4. Diabetic eye examination, Diabetic eye exam is updated  5. Monitor blood sugar closlely. nutrition counseling.  6. Sign and symptoms of hypoglycemia including shaking sweating,confusion and headaches.  This patient was seen by Leretha Pol FNP Collaboration with Dr Lavera Guise as a part of collaborative care agreement  Orders Placed This Encounter  Procedures  . DG Shoulder Left  . POCT HgB A1C    Meds ordered this encounter  Medications  . ibuprofen (ADVIL) 800 MG tablet    Sig: Take 1 tablet po two to three times daily as needed for pain.    Dispense:  60 tablet    Refill:  1    Order Specific Question:   Supervising Provider    Answer:   Lavera Guise [[id number]]  . metFORMIN (GLUCOPHAGE) 500 MG tablet    Sig: Take 1 tablet (500 mg total) by mouth 2 (two) times daily with a meal.    Dispense:  180 tablet    Refill:  1    Please note increased dose    Order Specific Question:   Supervising Provider    Answer:   Lavera Guise [[id number]]  . levothyroxine (SYNTHROID) 25 MCG tablet    Sig: Take 1 tablet (25 mcg total) by mouth daily before breakfast.    Dispense:  90 tablet    Refill:  1    Order Specific Question:   Supervising Provider    Answer:   Lavera Guise [[id number]]  . lisinopril-hydrochlorothiazide (ZESTORETIC) 20-12.5 MG tablet    Sig: Take 1 tablet by mouth daily. MUST SCHEDULE ANNUAL EXAM FOR FURTHER REFILLS    Dispense:  90 tablet    Refill:  1    Order Specific Question:   Supervising Provider    Answer:   Lavera Guise [[id number]]  . valACYclovir (VALTREX) 500 MG tablet    Sig: Take 1 tablet (500 mg  total) by mouth daily.    Dispense:  90 tablet    Refill:  1    Order Specific Question:   Supervising Provider    Answer:   Lavera Guise [[id number]]  . atorvastatin (LIPITOR) 10 MG tablet    Sig: TAKE 1 TABLET (10 MG TOTAL) BY MOUTH DAILY.    Dispense:  90 tablet    Refill:  1    Order Specific Question:   Supervising Provider    Answer:   Lavera Guise [[id number]]  . rOPINIRole (REQUIP) 2 MG tablet    Sig: TAKE 1 TABLET (2 MG TOTAL) BY MOUTH NIGHTLY.    Dispense:  90 tablet    Refill:  1    Order Specific Question:   Supervising Provider    Answer:   Lavera Guise [[id number]]    Time spent: 54 Minutes      Dr Lavera Guise Internal medicine

## 2018-11-01 ENCOUNTER — Telehealth: Payer: Self-pay

## 2018-11-01 NOTE — Telephone Encounter (Signed)
Pt advised for xray result and gave beth for referral for orthopedic  

## 2018-11-01 NOTE — Telephone Encounter (Signed)
-----   Message from Ronnell Freshwater, NP sent at 11/01/2018  9:55 AM EDT ----- Please let the patient know that x-ray of her shoulder shows minimal arthritic changes and otherwise looks good. Due to the amount of pain, may have rotator cuff or other tendonous injury. I would like to have her referred to orthopedics.

## 2018-11-01 NOTE — Progress Notes (Signed)
Added referral in epic and proficient for [facility] orthopedics.

## 2018-11-01 NOTE — Telephone Encounter (Signed)
lmom to call us back

## 2018-11-02 DIAGNOSIS — D485 Neoplasm of uncertain behavior of skin: Secondary | ICD-10-CM | POA: Diagnosis not present

## 2018-11-02 DIAGNOSIS — L821 Other seborrheic keratosis: Secondary | ICD-10-CM | POA: Diagnosis not present

## 2018-11-02 DIAGNOSIS — L249 Irritant contact dermatitis, unspecified cause: Secondary | ICD-10-CM | POA: Diagnosis not present

## 2018-11-02 DIAGNOSIS — Z85828 Personal history of other malignant neoplasm of skin: Secondary | ICD-10-CM | POA: Diagnosis not present

## 2018-11-02 DIAGNOSIS — L820 Inflamed seborrheic keratosis: Secondary | ICD-10-CM | POA: Diagnosis not present

## 2018-11-02 DIAGNOSIS — D229 Melanocytic nevi, unspecified: Secondary | ICD-10-CM | POA: Diagnosis not present

## 2018-11-02 DIAGNOSIS — Z1283 Encounter for screening for malignant neoplasm of skin: Secondary | ICD-10-CM | POA: Diagnosis not present

## 2018-11-02 DIAGNOSIS — D1800 Hemangioma unspecified site: Secondary | ICD-10-CM | POA: Diagnosis not present

## 2018-11-10 DIAGNOSIS — H401132 Primary open-angle glaucoma, bilateral, moderate stage: Secondary | ICD-10-CM | POA: Diagnosis not present

## 2018-11-19 ENCOUNTER — Other Ambulatory Visit: Payer: Self-pay | Admitting: Psychiatry

## 2018-11-19 DIAGNOSIS — F5105 Insomnia due to other mental disorder: Secondary | ICD-10-CM

## 2018-11-20 DIAGNOSIS — N183 Chronic kidney disease, stage 3 (moderate): Secondary | ICD-10-CM | POA: Diagnosis not present

## 2018-11-20 DIAGNOSIS — M25512 Pain in left shoulder: Secondary | ICD-10-CM | POA: Diagnosis not present

## 2018-11-20 DIAGNOSIS — M75102 Unspecified rotator cuff tear or rupture of left shoulder, not specified as traumatic: Secondary | ICD-10-CM | POA: Diagnosis not present

## 2018-11-20 DIAGNOSIS — E1122 Type 2 diabetes mellitus with diabetic chronic kidney disease: Secondary | ICD-10-CM | POA: Diagnosis not present

## 2018-11-21 ENCOUNTER — Encounter: Payer: Self-pay | Admitting: Psychiatry

## 2018-11-21 ENCOUNTER — Ambulatory Visit (INDEPENDENT_AMBULATORY_CARE_PROVIDER_SITE_OTHER): Payer: Medicare HMO | Admitting: Psychiatry

## 2018-11-21 ENCOUNTER — Other Ambulatory Visit: Payer: Self-pay

## 2018-11-21 DIAGNOSIS — F5101 Primary insomnia: Secondary | ICD-10-CM | POA: Diagnosis not present

## 2018-11-21 DIAGNOSIS — F22 Delusional disorders: Secondary | ICD-10-CM | POA: Diagnosis not present

## 2018-11-21 DIAGNOSIS — F5105 Insomnia due to other mental disorder: Secondary | ICD-10-CM

## 2018-11-21 MED ORDER — ZOLPIDEM TARTRATE 5 MG PO TABS: ORAL_TABLET | ORAL | 2 refills | Status: DC

## 2018-11-21 MED ORDER — QUETIAPINE FUMARATE 100 MG PO TABS: 100.0000 mg | ORAL_TABLET | Freq: Every day | ORAL | 0 refills | Status: DC

## 2018-11-21 NOTE — Progress Notes (Signed)
Virtual Visit via Telephone Note  I connected with Stacey Boyd on 11/21/18 at  2:30 PM EDT by telephone and verified that I am speaking with the correct person using two identifiers.   I discussed the limitations, risks, security and privacy concerns of performing an evaluation and management service by telephone and the availability of in person appointments. I also discussed with the patient that there may be a patient responsible charge related to this service. The patient expressed understanding and agreed to proceed.   I discussed the assessment and treatment plan with the patient. The patient was provided an opportunity to ask questions and all were answered. The patient agreed with the plan and demonstrated an understanding of the instructions.   The patient was advised to call back or seek an in-person evaluation if the symptoms worsen or if the condition fails to improve as anticipated.  [facility] MD OP Progress Note  11/21/2018 5:49 PM Stacey Boyd  MRN:  [id number]  Chief Complaint:  Chief Complaint    Follow-up     HPI: Stacey Boyd is a 73 year old Caucasian female, lives in [place], retired was evaluated by phone today.  Patient with history of delusional disorder, insomnia, hypertension, hypothyroidism.  Patient was offered a video consult and she declined.  Patient currently reports she lives at [place].  She just moved in last week.  She reports it is a Psychologist, clinical.  Her previous roommate will move in with her soon.  He continues to help her out with buying groceries for her and doing other chores around the house.  Patient reports her sleep is good on the medications.  She denies any significant mood symptoms.  She denies any auditory hallucinations, delusions or other  perceptual disturbances.  Patient appeared to be alert oriented to person place and situation.   Visit Diagnosis:    ICD-10-CM   1. Delusional disorder (HCC)  F22 QUEtiapine (SEROQUEL)  100 MG tablet  2. Primary insomnia  F51.01   3. Delusional disorder (HCC)  F22 QUEtiapine (SEROQUEL) 100 MG tablet   in remission  4. Insomnia due to mental condition  F51.05 zolpidem (AMBIEN) 5 MG tablet    Past Psychiatric History: I have reviewed past psychiatric history from my progress note on 12/29/2017.  Past Medical History:  Past Medical History:  Diagnosis Date  . Allergy   . Arthritis   . Cancer ([place])    skin cancer on right shoulder.   . Diabetes mellitus without complication ([place])    Pt states she is diet controlled.   . Glaucoma   . Hyperlipidemia   . Hypertension   . Thyroid disease   . Wrist fracture 2001   right    Past Surgical History:  Procedure Laterality Date  . ABDOMINAL HYSTERECTOMY  2005  . APPENDECTOMY  1980  . CARDIAC CATHETERIZATION Left 09/17/2015   Procedure: Left Heart Cath and Coronary Angiography;  Surgeon: Teodoro Spray, MD;  Location: [facility];  Service: Cardiovascular;  Laterality: Left;  . COLONOSCOPY WITH PROPOFOL N/A 11/07/2017   Procedure: COLONOSCOPY WITH PROPOFOL;  Surgeon: Jonathon Bellows, MD;  Location: [facility];  Service: Gastroenterology;  Laterality: N/A;  . FRACTURE SURGERY      Family Psychiatric History: I have reviewed family psychiatric history from my progress note on 12/29/2017.  Family History:  Family History  Problem Relation Age of Onset  . Heart disease Mother        h/o rheumatic fever  .  Heart disease Father   . Stroke Father   . Diabetes Sister   . Diabetes Brother   . Diabetes Sister   . Mental illness Other   . Colon cancer Neg Hx   . Breast cancer Neg Hx     Social History: I have reviewed social history from my progress note on 12/29/2017. Social History   Socioeconomic History  . Marital status: Divorced    Spouse name: Not on file  . Number of children: 3  . Years of education: Not on file  . Highest education level: 10th grade  Occupational History  . Not on file  Social Needs   . Financial resource strain: Not hard at all  . Food insecurity    Worry: Never true    Inability: Never true  . Transportation needs    Medical: No    Non-medical: No  Tobacco Use  . Smoking status: Former Smoker    Quit date: 09/17/1979    Years since quitting: 39.2  . Smokeless tobacco: Never Used  Substance and Sexual Activity  . Alcohol use: No  . Drug use: No  . Sexual activity: Yes    Partners: Male    Birth control/protection: None  Lifestyle  . Physical activity    Days per week: 0 days    Minutes per session: 0 min  . Stress: Not at all  Relationships  . Social Herbalist on phone: Not on file    Gets together: Not on file    Attends religious service: 1 to 4 times per year    Active member of club or organization: No    Attends meetings of clubs or organizations: Never    Relationship status: Divorced  Other Topics Concern  . Not on file  Social History Narrative   3 kids, local   Lives with daughter as of 2016   Divorced ~2002    Allergies:  Allergies  Allergen Reactions  . Codeine Hives and Nausea And Vomiting    Metabolic Disorder Labs: Lab Results  Component Value Date   HGBA1C 8.0 (A) 10/31/2018   MPG 134.11 10/10/2017   No results found for: PROLACTIN Lab Results  Component Value Date   CHOL 188 10/10/2017   TRIG 107 10/10/2017   HDL 43 10/10/2017   CHOLHDL 4.4 10/10/2017   VLDL 21 10/10/2017   LDLCALC 124 (H) 10/10/2017   LDLCALC 105 (H) 08/22/2017   Lab Results  Component Value Date   TSH 2.900 02/13/2018   TSH 5.610 (H) 11/14/2017    Therapeutic Level Labs: No results found for: LITHIUM No results found for: VALPROATE No components found for:  CBMZ  Current Medications: Current Outpatient Medications  Medication Sig Dispense Refill  . atorvastatin (LIPITOR) 10 MG tablet TAKE 1 TABLET (10 MG TOTAL) BY MOUTH DAILY. 90 tablet 1  . brimonidine (ALPHAGAN) 0.2 % ophthalmic solution     . brinzolamide (AZOPT) 1 %  ophthalmic suspension Place 1 drop into both eyes 2 (two) times daily.    . Cholecalciferol (VITAMIN D3) 5000 units TABS Take 5,000 Units by mouth daily.     . dorzolamide-timolol (COSOPT) 22.3-6.8 MG/ML ophthalmic solution Place 1 drop into both eyes 2 (two) times daily.     . fexofenadine (ALLEGRA) 180 MG tablet Take 180 mg by mouth daily.    . fluticasone (FLONASE) 50 MCG/ACT nasal spray Place 2 sprays into both nostrils at bedtime. 16 g 2  . ibuprofen (ADVIL) 800 MG  tablet Take 1 tablet po two to three times daily as needed for pain. 60 tablet 1  . ketorolac (ACULAR) 0.4 % SOLN     . latanoprost (XALATAN) 0.005 % ophthalmic solution 1 drop nightly.    . levothyroxine (SYNTHROID) 25 MCG tablet Take 1 tablet (25 mcg total) by mouth daily before breakfast. 90 tablet 1  . lisinopril-hydrochlorothiazide (ZESTORETIC) 20-12.5 MG tablet Take 1 tablet by mouth daily. MUST SCHEDULE ANNUAL EXAM FOR FURTHER REFILLS 90 tablet 1  . metFORMIN (GLUCOPHAGE) 500 MG tablet Take 1 tablet (500 mg total) by mouth 2 (two) times daily with a meal. 180 tablet 1  . propranolol (INDERAL) 40 MG tablet Take 0.5 tablets (20 mg total) by mouth 2 (two) times daily. 60 tablet 1  . QUEtiapine (SEROQUEL) 100 MG tablet Take 1 tablet (100 mg total) by mouth at bedtime. For mood and psychosis 90 tablet 0  . rOPINIRole (REQUIP) 2 MG tablet TAKE 1 TABLET (2 MG TOTAL) BY MOUTH NIGHTLY. 90 tablet 1  . valACYclovir (VALTREX) 500 MG tablet Take 1 tablet (500 mg total) by mouth daily. 90 tablet 1  . zolpidem (AMBIEN) 5 MG tablet TAKE ONE TABLET AT BEDTIME AS NEEDED FORSLEEP 30 tablet 2   No current facility-administered medications for this visit.      Musculoskeletal: Strength & Muscle Tone: UTA Gait & Station: UTA Patient leans: N/A  Psychiatric Specialty Exam: Review of Systems  Psychiatric/Behavioral: The patient is not nervous/anxious.   All other systems reviewed and are negative.   There were no vitals taken for this  visit.There is no height or weight on file to calculate BMI.  General Appearance: UTA  Eye Contact:  UTA  Speech:  Clear and Coherent  Volume:  Normal  Mood:  Euthymic  Affect:  UTA  Thought Process:  Goal Directed and Descriptions of Associations: Intact  Orientation:  Full (Time, Place, and Person)  Thought Content: Logical   Suicidal Thoughts:  No  Homicidal Thoughts:  No  Memory:  Immediate;   Fair Recent;   Fair Remote;   Fair  Judgement:  Fair  Insight:  Fair  Psychomotor Activity:  UTA  Concentration:  Concentration: Fair and Attention Span: Fair  Recall:  [employer] of Knowledge: Fair  Language: Fair  Akathisia:  No  Handed:  Right  AIMS (if indicated): Denies tremors, rigidity  Assets:  Communication Skills Desire for Improvement Housing Social Support  ADL's:  Intact  Cognition: WNL  Sleep:  Fair   Screenings: AIMS     Office Visit from 03/03/2018 in [facility] Total Score  0    AUDIT     Admission (Discharged) from 10/10/2017 in [facility]  Alcohol Use Disorder Identification Test Final Score (AUDIT)  0    Mini-Mental     Clinical Support from 05/29/2018 in [facility]  Total Score (max 30 points )  30    PHQ2-9     Office Visit from 10/31/2018 in [facility] Office Visit from 08/29/2018 in [facility] [place] from 05[facility]29/2018 in [facility] Visit from 04/06/2018 in [facility], [facility] Office Visit from 03/27/2018 in [facility], [facility]  PHQ-2 Total Score  0  0  0  0  0       Assessment and Plan: Stacey Boyd is a 73 year old Caucasian female who has a history of delusional disorder, insomnia, hypertension, thyroid disease was  evaluated by phone today.  Patient continues to do well on the current medication regimen.  Plan For delusional disorder- stable Seroquel 100 mg p.o. nightly. Patient  currently wants to stay on this dosage.  For insomnia-stable Seroquel as prescribed Ambien 5 mg p.o. nightly as needed  Follow-up in clinic in 2 to 3 months or sooner if needed.  September 30 at 2 PM  I have spent atleast 15 minutes non face to face with patient today. More than 50 % of the time was spent for psychoeducation and supportive psychotherapy and care coordination.  This note was generated in part or whole with voice recognition software. Voice recognition is usually quite accurate but there are transcription errors that can and very often do occur. I apologize for any typographical errors that were not detected and corrected.        Ursula Alert, MD 11/21/2018, 5:49 PM

## 2018-12-11 DIAGNOSIS — H401132 Primary open-angle glaucoma, bilateral, moderate stage: Secondary | ICD-10-CM | POA: Diagnosis not present

## 2018-12-22 ENCOUNTER — Other Ambulatory Visit: Payer: Self-pay

## 2018-12-22 MED ORDER — PROPRANOLOL HCL 40 MG PO TABS: 20.0000 mg | ORAL_TABLET | Freq: Two times a day (BID) | ORAL | 1 refills | Status: DC

## 2018-12-28 ENCOUNTER — Telehealth: Payer: Self-pay

## 2018-12-28 DIAGNOSIS — F22 Delusional disorders: Secondary | ICD-10-CM

## 2018-12-28 MED ORDER — QUETIAPINE FUMARATE 100 MG PO TABS: 150.0000 mg | ORAL_TABLET | Freq: Every day | ORAL | 0 refills | Status: DC

## 2018-12-28 NOTE — Telephone Encounter (Signed)
Called patient , spoke to her room mate Vivien Rota.  Patient delusional that there is someone in the attic. Discussed increasing medication seroquel. Also crisis plan discussed with patient and Vivien Rota. Discussed calling 911 or going to nearest ED if symptoms continue to worsen.

## 2018-12-28 NOTE — Telephone Encounter (Signed)
pt is having alot of issues and he wants to know what he should do or if medications can be changed.

## 2019-01-16 ENCOUNTER — Other Ambulatory Visit: Payer: Self-pay

## 2019-01-16 DIAGNOSIS — E039 Hypothyroidism, unspecified: Secondary | ICD-10-CM

## 2019-01-16 MED ORDER — LEVOTHYROXINE SODIUM 25 MCG PO TABS: 25.0000 ug | ORAL_TABLET | Freq: Every day | ORAL | 1 refills | Status: DC

## 2019-02-02 ENCOUNTER — Ambulatory Visit: Payer: Medicare HMO | Admitting: Nurse Practitioner

## 2019-02-12 ENCOUNTER — Other Ambulatory Visit: Payer: Self-pay

## 2019-02-12 DIAGNOSIS — M25512 Pain in left shoulder: Secondary | ICD-10-CM

## 2019-02-12 DIAGNOSIS — G8929 Other chronic pain: Secondary | ICD-10-CM

## 2019-02-12 MED ORDER — PROPRANOLOL HCL 40 MG PO TABS: 20.0000 mg | ORAL_TABLET | Freq: Two times a day (BID) | ORAL | 1 refills | Status: DC

## 2019-02-12 MED ORDER — IBUPROFEN 800 MG PO TABS: ORAL_TABLET | ORAL | 1 refills | Status: DC

## 2019-02-14 ENCOUNTER — Ambulatory Visit: Payer: Medicare HMO | Admitting: Psychiatry

## 2019-03-06 ENCOUNTER — Other Ambulatory Visit: Payer: Self-pay | Admitting: Psychiatry

## 2019-03-06 DIAGNOSIS — F5105 Insomnia due to other mental disorder: Secondary | ICD-10-CM

## 2019-03-28 ENCOUNTER — Telehealth: Payer: Self-pay

## 2019-03-28 NOTE — Telephone Encounter (Signed)
MEDICAL RECORD REQUEST COMPLETED AND PLACED IN MAIL.

## 2019-04-16 ENCOUNTER — Other Ambulatory Visit: Payer: Self-pay

## 2019-06-04 ENCOUNTER — Ambulatory Visit: Payer: Self-pay | Admitting: Nurse Practitioner

## 2019-08-13 DIAGNOSIS — H401132 Primary open-angle glaucoma, bilateral, moderate stage: Secondary | ICD-10-CM | POA: Diagnosis not present

## 2019-08-28 DIAGNOSIS — Z0000 Encounter for general adult medical examination without abnormal findings: Secondary | ICD-10-CM | POA: Diagnosis not present

## 2019-08-28 DIAGNOSIS — N1831 Chronic kidney disease, stage 3a: Secondary | ICD-10-CM | POA: Diagnosis not present

## 2019-08-28 DIAGNOSIS — E1122 Type 2 diabetes mellitus with diabetic chronic kidney disease: Secondary | ICD-10-CM | POA: Diagnosis not present

## 2019-08-28 DIAGNOSIS — E7800 Pure hypercholesterolemia, unspecified: Secondary | ICD-10-CM | POA: Diagnosis not present

## 2019-08-28 DIAGNOSIS — I129 Hypertensive chronic kidney disease with stage 1 through stage 4 chronic kidney disease, or unspecified chronic kidney disease: Secondary | ICD-10-CM | POA: Diagnosis not present

## 2019-08-31 DIAGNOSIS — E1121 Type 2 diabetes mellitus with diabetic nephropathy: Secondary | ICD-10-CM | POA: Diagnosis not present

## 2019-08-31 DIAGNOSIS — I10 Essential (primary) hypertension: Secondary | ICD-10-CM | POA: Diagnosis not present

## 2019-08-31 DIAGNOSIS — N1831 Chronic kidney disease, stage 3a: Secondary | ICD-10-CM | POA: Diagnosis not present

## 2019-08-31 DIAGNOSIS — E7800 Pure hypercholesterolemia, unspecified: Secondary | ICD-10-CM | POA: Diagnosis not present

## 2019-09-05 DIAGNOSIS — H401132 Primary open-angle glaucoma, bilateral, moderate stage: Secondary | ICD-10-CM | POA: Diagnosis not present

## 2019-09-12 DIAGNOSIS — I10 Essential (primary) hypertension: Secondary | ICD-10-CM | POA: Diagnosis not present

## 2019-09-12 DIAGNOSIS — H2512 Age-related nuclear cataract, left eye: Secondary | ICD-10-CM | POA: Diagnosis not present

## 2019-09-13 ENCOUNTER — Other Ambulatory Visit: Payer: Self-pay

## 2019-09-13 ENCOUNTER — Encounter: Payer: Self-pay | Admitting: Ophthalmology

## 2019-09-19 ENCOUNTER — Other Ambulatory Visit: Payer: Self-pay

## 2019-09-19 MED ORDER — VALACYCLOVIR HCL 1 G PO TABS
1000.0000 mg | ORAL_TABLET | ORAL | 0 refills | Status: DC
Start: 2019-09-19 — End: 2020-07-16

## 2019-09-21 ENCOUNTER — Other Ambulatory Visit
Admission: RE | Admit: 2019-09-21 | Discharge: 2019-09-21 | Disposition: A | Discharge status: Home / Self Care | Payer: Medicare HMO | Source: Ambulatory Visit | Attending: Ophthalmology | Admitting: Ophthalmology

## 2019-09-21 DIAGNOSIS — Z01812 Encounter for preprocedural laboratory examination: Secondary | ICD-10-CM | POA: Diagnosis not present

## 2019-09-21 DIAGNOSIS — Z20822 Contact with and (suspected) exposure to covid-19: Secondary | ICD-10-CM | POA: Diagnosis not present

## 2019-09-21 LAB — SARS CORONAVIRUS 2 (TAT 6-24 HRS): SARS Coronavirus 2: NEGATIVE

## 2019-09-21 NOTE — Discharge Instructions (Signed)

## 2019-09-25 ENCOUNTER — Ambulatory Visit
Admission: RE | Admit: 2019-09-25 | Discharge: 2019-09-25 | Disposition: A | Discharge status: Home / Self Care | Payer: Medicare HMO | Attending: Ophthalmology | Admitting: Ophthalmology

## 2019-09-25 ENCOUNTER — Ambulatory Visit: Payer: Medicare HMO | Admitting: Anesthesiology

## 2019-09-25 ENCOUNTER — Encounter
Admission: RE | Disposition: A | Discharge status: Home / Self Care | Payer: Self-pay | Source: Home / Self Care | Attending: Ophthalmology

## 2019-09-25 ENCOUNTER — Other Ambulatory Visit: Payer: Self-pay

## 2019-09-25 ENCOUNTER — Encounter: Payer: Self-pay | Admitting: Ophthalmology

## 2019-09-25 DIAGNOSIS — H2512 Age-related nuclear cataract, left eye: Secondary | ICD-10-CM | POA: Diagnosis not present

## 2019-09-25 DIAGNOSIS — E119 Type 2 diabetes mellitus without complications: Secondary | ICD-10-CM | POA: Diagnosis not present

## 2019-09-25 DIAGNOSIS — M1990 Unspecified osteoarthritis, unspecified site: Secondary | ICD-10-CM | POA: Insufficient documentation

## 2019-09-25 DIAGNOSIS — Z87891 Personal history of nicotine dependence: Secondary | ICD-10-CM | POA: Insufficient documentation

## 2019-09-25 DIAGNOSIS — I10 Essential (primary) hypertension: Secondary | ICD-10-CM | POA: Insufficient documentation

## 2019-09-25 DIAGNOSIS — F419 Anxiety disorder, unspecified: Secondary | ICD-10-CM | POA: Insufficient documentation

## 2019-09-25 DIAGNOSIS — H25812 Combined forms of age-related cataract, left eye: Secondary | ICD-10-CM | POA: Diagnosis not present

## 2019-09-25 DIAGNOSIS — I739 Peripheral vascular disease, unspecified: Secondary | ICD-10-CM | POA: Insufficient documentation

## 2019-09-25 HISTORY — DX: Depression, unspecified: F32.A

## 2019-09-25 HISTORY — PX: CATARACT EXTRACTION W/PHACO: SHX586

## 2019-09-25 HISTORY — DX: Pain in left shoulder: M25.512

## 2019-09-25 HISTORY — DX: Age-related osteoporosis without current pathological fracture: M81.0

## 2019-09-25 HISTORY — DX: Dyspnea, unspecified: R06.00

## 2019-09-25 HISTORY — DX: Hypothyroidism, unspecified: E03.9

## 2019-09-25 HISTORY — DX: Diverticulitis of intestine, part unspecified, without perforation or abscess without bleeding: K57.92

## 2019-09-25 LAB — GLUCOSE, CAPILLARY
Glucose-Capillary: 109 mg/dL — ABNORMAL HIGH (ref 70–99)
Glucose-Capillary: 118 mg/dL — ABNORMAL HIGH (ref 70–99)

## 2019-09-25 SURGERY — PHACOEMULSIFICATION, CATARACT, WITH IOL INSERTION
Anesthesia: Monitor Anesthesia Care | Site: Eye | Laterality: Left

## 2019-09-25 MED ORDER — TETRACAINE HCL 0.5 % OP SOLN
1.0000 [drp] | OPHTHALMIC | Status: DC | PRN
  Administered 2019-09-25 (×3): 1 [drp] via OPHTHALMIC

## 2019-09-25 MED ORDER — LIDOCAINE HCL (PF) 2 % IJ SOLN
INTRAOCULAR | Status: DC | PRN
  Administered 2019-09-25: 2 mL

## 2019-09-25 MED ORDER — FENTANYL CITRATE (PF) 100 MCG/2ML IJ SOLN
INTRAMUSCULAR | Status: DC | PRN
  Administered 2019-09-25: 50 ug via INTRAVENOUS

## 2019-09-25 MED ORDER — ARMC OPHTHALMIC DILATING DROPS
1.0000 "application " | OPHTHALMIC | Status: DC | PRN
  Administered 2019-09-25 (×3): 1 via OPHTHALMIC

## 2019-09-25 MED ORDER — EPINEPHRINE PF 1 MG/ML IJ SOLN
INTRAOCULAR | Status: DC | PRN
  Administered 2019-09-25: 49 mL via OPHTHALMIC

## 2019-09-25 MED ORDER — BRIMONIDINE TARTRATE-TIMOLOL 0.2-0.5 % OP SOLN
OPHTHALMIC | Status: DC | PRN
  Administered 2019-09-25: 1 [drp] via OPHTHALMIC

## 2019-09-25 MED ORDER — NA CHONDROIT SULF-NA HYALURON 40-17 MG/ML IO SOLN
INTRAOCULAR | Status: DC | PRN
  Administered 2019-09-25: 1 mL via INTRAOCULAR

## 2019-09-25 MED ORDER — MIDAZOLAM HCL 2 MG/2ML IJ SOLN
INTRAMUSCULAR | Status: DC | PRN
  Administered 2019-09-25: 1 mg via INTRAVENOUS

## 2019-09-25 MED ORDER — MOXIFLOXACIN HCL 0.5 % OP SOLN
OPHTHALMIC | Status: DC | PRN
  Administered 2019-09-25: 0.2 mL via OPHTHALMIC

## 2019-09-25 SURGICAL SUPPLY — 19 items
CANNULA ANT/CHMB 27G (MISCELLANEOUS) ×2 IMPLANT
CANNULA ANT/CHMB 27GA (MISCELLANEOUS) ×6 IMPLANT
GLOVE SURG LX 8.0 MICRO (GLOVE) ×2
GLOVE SURG LX STRL 8.0 MICRO (GLOVE) ×1 IMPLANT
GLOVE SURG TRIUMPH 8.0 PF LTX (GLOVE) ×3 IMPLANT
GOWN STRL REUS W/ TWL LRG LVL3 (GOWN DISPOSABLE) ×2 IMPLANT
GOWN STRL REUS W/TWL LRG LVL3 (GOWN DISPOSABLE) ×4
LENS IOL TECNIS ITEC 15.5 (Intraocular Lens) ×2 IMPLANT
MARKER SKIN DUAL TIP RULER LAB (MISCELLANEOUS) ×3 IMPLANT
NDL FILTER BLUNT 18X1 1/2 (NEEDLE) ×1 IMPLANT
NEEDLE FILTER BLUNT 18X 1/2SAF (NEEDLE) ×2
NEEDLE FILTER BLUNT 18X1 1/2 (NEEDLE) ×1 IMPLANT
PACK EYE AFTER SURG (MISCELLANEOUS) ×3 IMPLANT
PACK OPTHALMIC (MISCELLANEOUS) ×3 IMPLANT
PACK PORFILIO (MISCELLANEOUS) ×3 IMPLANT
SYR 3ML LL SCALE MARK (SYRINGE) ×3 IMPLANT
SYR TB 1ML LUER SLIP (SYRINGE) ×3 IMPLANT
WATER STERILE IRR 250ML POUR (IV SOLUTION) ×3 IMPLANT
WIPE NON LINTING 3.25X3.25 (MISCELLANEOUS) ×3 IMPLANT

## 2019-09-25 NOTE — Anesthesia Procedure Notes (Signed)
Procedure Name: MAC Date/Time: 09/25/2019 11:47 AM Performed by: Cameron Ali, CRNA Pre-anesthesia Checklist: Patient identified, Emergency Drugs available, Suction available, Timeout performed and Patient being monitored Patient Re-evaluated:Patient Re-evaluated prior to induction Oxygen Delivery Method: Nasal cannula Placement Confirmation: positive ETCO2

## 2019-09-25 NOTE — Anesthesia Preprocedure Evaluation (Signed)
Anesthesia Evaluation  Patient identified by MRN, date of birth, ID band Patient awake    Reviewed: Allergy & Precautions, NPO status , Patient's Chart, lab work & pertinent test results, reviewed documented beta blocker date and time   Airway Mallampati: III  TM Distance: >3 FB Neck ROM: Full    Dental  (+) Chipped   Pulmonary former smoker,    Pulmonary exam normal        Cardiovascular hypertension, Pt. on medications Normal cardiovascular exam     Neuro/Psych Anxiety  Neuromuscular disease (peripheral neuropathy)    GI/Hepatic negative GI ROS,   Endo/Other  diabetes, Type 2Hypothyroidism BMI 30  Renal/GU CRFRenal disease     Musculoskeletal  (+) Arthritis ,   Abdominal Normal abdominal exam  (+)   Peds  Hematology negative hematology ROS (+)   Anesthesia Other Findings   Reproductive/Obstetrics negative OB ROS                             Anesthesia Physical  Anesthesia Plan  ASA: III  Anesthesia Plan: MAC   Post-op Pain Management:    Induction: Intravenous  PONV Risk Score and Plan: 2 and TIVA, Midazolam and Treatment may vary due to age or medical condition  Airway Management Planned: Natural Airway and Nasal Cannula  Additional Equipment:   Intra-op Plan:   Post-operative Plan:   Informed Consent: I have reviewed the patients History and Physical, chart, labs and discussed the procedure including the risks, benefits and alternatives for the proposed anesthesia with the patient or authorized representative who has indicated his/her understanding and acceptance.     Dental advisory given  Plan Discussed with: CRNA  Anesthesia Plan Comments:         Anesthesia Quick Evaluation

## 2019-09-25 NOTE — Anesthesia Postprocedure Evaluation (Signed)
Anesthesia Post Note  Patient: Stacey Boyd  Procedure(s) Performed: CATARACT EXTRACTION PHACO AND INTRAOCULAR LENS PLACEMENT (IOC) LEFT   4.45  00:36.1 (Left Eye)     Anesthesia Post Evaluation  Ardeth Sportsman

## 2019-09-25 NOTE — Transfer of Care (Signed)
Immediate Anesthesia Transfer of Care Note  Patient: Stacey Boyd  Procedure(s) Performed: CATARACT EXTRACTION PHACO AND INTRAOCULAR LENS PLACEMENT (IOC) LEFT (Left Eye)  Patient Location: PACU  Anesthesia Type: MAC  Level of Consciousness: awake, alert  and patient cooperative  Airway and Oxygen Therapy: Patient Spontanous Breathing and Patient connected to supplemental oxygen  Post-op Assessment: Post-op Vital signs reviewed, Patient's Cardiovascular Status Stable, Respiratory Function Stable, Patent Airway and No signs of Nausea or vomiting  Post-op Vital Signs: Reviewed and stable  Complications: No apparent anesthesia complications

## 2019-09-25 NOTE — H&P (Signed)
All labs reviewed. Abnormal studies sent to patients PCP when indicated.  Previous H&P reviewed, patient examined, there are NO CHANGES.  Stacey Seivert Porfilio5/11/202111:41 AM

## 2019-09-25 NOTE — Op Note (Signed)
PREOPERATIVE DIAGNOSIS:  Nuclear sclerotic cataract of the left eye.   POSTOPERATIVE DIAGNOSIS:  Nuclear sclerotic cataract of the left eye.   OPERATIVE PROCEDURE:@   SURGEON:  Birder Robson, MD.   ANESTHESIA:  Anesthesiologist: Ardeth Sportsman, MD CRNA: Cameron Ali, CRNA  1.      Managed anesthesia care. 2.     0.44ml of Shugarcaine was instilled following the paracentesis   COMPLICATIONS:  None.   TECHNIQUE:   Stop and chop   DESCRIPTION OF PROCEDURE:  The patient was examined and consented in the preoperative holding area where the aforementioned topical anesthesia was applied to the left eye and then brought back to the Operating Room where the left eye was prepped and draped in the usual sterile ophthalmic fashion and a lid speculum was placed. A paracentesis was created with the side port blade and the anterior chamber was filled with viscoelastic. A near clear corneal incision was performed with the steel keratome. A continuous curvilinear capsulorrhexis was performed with a cystotome followed by the capsulorrhexis forceps. Hydrodissection and hydrodelineation were carried out with BSS on a blunt cannula. The lens was removed in a stop and chop  technique and the remaining cortical material was removed with the irrigation-aspiration handpiece. The capsular bag was inflated with viscoelastic and the Technis [id number] lens was placed in the capsular bag without complication. The remaining viscoelastic was removed from the eye with the irrigation-aspiration handpiece. The wounds were hydrated. The anterior chamber was flushed with BSS and the eye was inflated to physiologic pressure. 0.67ml Vigamox was placed in the anterior chamber. The wounds were found to be water tight. The eye was dressed with Combigan. The patient was given protective glasses to wear throughout the day and a shield with which to sleep tonight. The patient was also given drops with which to begin a drop regimen today and will  follow-up with me in one day. Implant Name Type Inv. Item Serial No. Manufacturer Lot No. LRB No. Used Action  LENS IOL DIOP 15.5 - [id number] Intraocular Lens LENS IOL DIOP 15.5 [id number]  Left 1 Implanted    Procedure(s): CATARACT EXTRACTION PHACO AND INTRAOCULAR LENS PLACEMENT (IOC) LEFT   4.45  00:36.1 (Left)  Electronically signed: Birder Robson 09/25/2019 12:06 PM

## 2019-09-26 ENCOUNTER — Encounter: Payer: Self-pay | Admitting: *Deleted

## 2019-09-28 DIAGNOSIS — H401133 Primary open-angle glaucoma, bilateral, severe stage: Secondary | ICD-10-CM | POA: Diagnosis not present

## 2019-10-04 ENCOUNTER — Other Ambulatory Visit: Payer: Self-pay

## 2019-10-04 ENCOUNTER — Encounter: Payer: Self-pay | Admitting: Ophthalmology

## 2019-10-09 DIAGNOSIS — H2511 Age-related nuclear cataract, right eye: Secondary | ICD-10-CM | POA: Diagnosis not present

## 2019-10-11 NOTE — Discharge Instructions (Signed)

## 2019-10-12 ENCOUNTER — Other Ambulatory Visit
Admission: RE | Admit: 2019-10-12 | Discharge: 2019-10-12 | Disposition: A | Discharge status: Home / Self Care | Payer: Medicare HMO | Source: Ambulatory Visit | Attending: Ophthalmology | Admitting: Ophthalmology

## 2019-10-12 ENCOUNTER — Other Ambulatory Visit: Payer: Self-pay

## 2019-10-12 DIAGNOSIS — Z01812 Encounter for preprocedural laboratory examination: Secondary | ICD-10-CM | POA: Diagnosis not present

## 2019-10-12 DIAGNOSIS — Z20822 Contact with and (suspected) exposure to covid-19: Secondary | ICD-10-CM | POA: Diagnosis not present

## 2019-10-13 LAB — SARS CORONAVIRUS 2 (TAT 6-24 HRS): SARS Coronavirus 2: NEGATIVE

## 2019-10-16 ENCOUNTER — Encounter: Payer: Self-pay | Admitting: Ophthalmology

## 2019-10-16 ENCOUNTER — Other Ambulatory Visit: Payer: Self-pay

## 2019-10-16 ENCOUNTER — Encounter
Admission: RE | Disposition: A | Discharge status: Home / Self Care | Payer: Self-pay | Source: Home / Self Care | Attending: Ophthalmology

## 2019-10-16 ENCOUNTER — Ambulatory Visit: Payer: Medicare HMO | Admitting: Anesthesiology

## 2019-10-16 ENCOUNTER — Ambulatory Visit
Admission: RE | Admit: 2019-10-16 | Discharge: 2019-10-16 | Disposition: A | Discharge status: Home / Self Care | Payer: Medicare HMO | Attending: Ophthalmology | Admitting: Ophthalmology

## 2019-10-16 DIAGNOSIS — E1136 Type 2 diabetes mellitus with diabetic cataract: Secondary | ICD-10-CM | POA: Diagnosis not present

## 2019-10-16 DIAGNOSIS — Z79899 Other long term (current) drug therapy: Secondary | ICD-10-CM | POA: Insufficient documentation

## 2019-10-16 DIAGNOSIS — E039 Hypothyroidism, unspecified: Secondary | ICD-10-CM | POA: Diagnosis not present

## 2019-10-16 DIAGNOSIS — H2511 Age-related nuclear cataract, right eye: Secondary | ICD-10-CM | POA: Diagnosis not present

## 2019-10-16 DIAGNOSIS — E1122 Type 2 diabetes mellitus with diabetic chronic kidney disease: Secondary | ICD-10-CM | POA: Diagnosis not present

## 2019-10-16 DIAGNOSIS — F419 Anxiety disorder, unspecified: Secondary | ICD-10-CM | POA: Insufficient documentation

## 2019-10-16 DIAGNOSIS — Z79890 Hormone replacement therapy (postmenopausal): Secondary | ICD-10-CM | POA: Diagnosis not present

## 2019-10-16 DIAGNOSIS — F329 Major depressive disorder, single episode, unspecified: Secondary | ICD-10-CM | POA: Insufficient documentation

## 2019-10-16 DIAGNOSIS — I129 Hypertensive chronic kidney disease with stage 1 through stage 4 chronic kidney disease, or unspecified chronic kidney disease: Secondary | ICD-10-CM | POA: Insufficient documentation

## 2019-10-16 DIAGNOSIS — M1990 Unspecified osteoarthritis, unspecified site: Secondary | ICD-10-CM | POA: Insufficient documentation

## 2019-10-16 DIAGNOSIS — N189 Chronic kidney disease, unspecified: Secondary | ICD-10-CM | POA: Diagnosis not present

## 2019-10-16 DIAGNOSIS — E7800 Pure hypercholesterolemia, unspecified: Secondary | ICD-10-CM | POA: Diagnosis not present

## 2019-10-16 DIAGNOSIS — Z885 Allergy status to narcotic agent status: Secondary | ICD-10-CM | POA: Diagnosis not present

## 2019-10-16 DIAGNOSIS — Z87891 Personal history of nicotine dependence: Secondary | ICD-10-CM | POA: Diagnosis not present

## 2019-10-16 DIAGNOSIS — H25811 Combined forms of age-related cataract, right eye: Secondary | ICD-10-CM | POA: Diagnosis not present

## 2019-10-16 DIAGNOSIS — E1142 Type 2 diabetes mellitus with diabetic polyneuropathy: Secondary | ICD-10-CM | POA: Insufficient documentation

## 2019-10-16 DIAGNOSIS — Z7984 Long term (current) use of oral hypoglycemic drugs: Secondary | ICD-10-CM | POA: Insufficient documentation

## 2019-10-16 HISTORY — PX: CATARACT EXTRACTION W/PHACO: SHX586

## 2019-10-16 LAB — GLUCOSE, CAPILLARY
Glucose-Capillary: 123 mg/dL — ABNORMAL HIGH (ref 70–99)
Glucose-Capillary: 133 mg/dL — ABNORMAL HIGH (ref 70–99)

## 2019-10-16 SURGERY — PHACOEMULSIFICATION, CATARACT, WITH IOL INSERTION
Anesthesia: Monitor Anesthesia Care | Site: Eye | Laterality: Right

## 2019-10-16 MED ORDER — MIDAZOLAM HCL 2 MG/2ML IJ SOLN
INTRAMUSCULAR | Status: DC | PRN
  Administered 2019-10-16: 2 mg via INTRAVENOUS

## 2019-10-16 MED ORDER — MOXIFLOXACIN HCL 0.5 % OP SOLN
OPHTHALMIC | Status: DC | PRN
  Administered 2019-10-16: 0.2 mL via OPHTHALMIC

## 2019-10-16 MED ORDER — LIDOCAINE HCL (PF) 2 % IJ SOLN
INTRAOCULAR | Status: DC | PRN
  Administered 2019-10-16: 1 mL

## 2019-10-16 MED ORDER — EPINEPHRINE PF 1 MG/ML IJ SOLN
INTRAOCULAR | Status: DC | PRN
  Administered 2019-10-16: 58 mL via OPHTHALMIC

## 2019-10-16 MED ORDER — TETRACAINE HCL 0.5 % OP SOLN
1.0000 [drp] | OPHTHALMIC | Status: DC | PRN
  Administered 2019-10-16 (×3): 1 [drp] via OPHTHALMIC

## 2019-10-16 MED ORDER — FENTANYL CITRATE (PF) 100 MCG/2ML IJ SOLN
INTRAMUSCULAR | Status: DC | PRN
  Administered 2019-10-16: 50 ug via INTRAVENOUS

## 2019-10-16 MED ORDER — BRIMONIDINE TARTRATE-TIMOLOL 0.2-0.5 % OP SOLN
OPHTHALMIC | Status: DC | PRN
  Administered 2019-10-16: 1 [drp] via OPHTHALMIC

## 2019-10-16 MED ORDER — ARMC OPHTHALMIC DILATING DROPS
1.0000 "application " | OPHTHALMIC | Status: DC | PRN
  Administered 2019-10-16 (×3): 1 via OPHTHALMIC

## 2019-10-16 MED ORDER — NA CHONDROIT SULF-NA HYALURON 40-17 MG/ML IO SOLN
INTRAOCULAR | Status: DC | PRN
  Administered 2019-10-16: 1 mL via INTRAOCULAR

## 2019-10-16 SURGICAL SUPPLY — 21 items
CANNULA ANT/CHMB 27G (MISCELLANEOUS) ×2 IMPLANT
CANNULA ANT/CHMB 27GA (MISCELLANEOUS) ×6 IMPLANT
GLOVE SURG LX 8.0 MICRO (GLOVE) ×4
GLOVE SURG LX STRL 8.0 MICRO (GLOVE) ×1 IMPLANT
GLOVE SURG TRIUMPH 8.0 PF LTX (GLOVE) ×3 IMPLANT
GOWN STRL REUS W/ TWL LRG LVL3 (GOWN DISPOSABLE) ×2 IMPLANT
GOWN STRL REUS W/TWL LRG LVL3 (GOWN DISPOSABLE) ×4
LENS IOL TECNIS ITEC 13.0 (Intraocular Lens) ×2 IMPLANT
MARKER SKIN DUAL TIP RULER LAB (MISCELLANEOUS) ×3 IMPLANT
NDL FILTER BLUNT 18X1 1/2 (NEEDLE) ×1 IMPLANT
NEEDLE FILTER BLUNT 18X 1/2SAF (NEEDLE) ×2
NEEDLE FILTER BLUNT 18X1 1/2 (NEEDLE) ×1 IMPLANT
PACK EYE AFTER SURG (MISCELLANEOUS) ×3 IMPLANT
PACK OPTHALMIC (MISCELLANEOUS) ×3 IMPLANT
PACK PORFILIO (MISCELLANEOUS) ×3 IMPLANT
SUT ETHILON 10-0 CS-B-6CS-B-6 (SUTURE)
SUTURE EHLN 10-0 CS-B-6CS-B-6 (SUTURE) IMPLANT
SYR 3ML LL SCALE MARK (SYRINGE) ×3 IMPLANT
SYR TB 1ML LUER SLIP (SYRINGE) ×3 IMPLANT
WATER STERILE IRR 250ML POUR (IV SOLUTION) ×3 IMPLANT
WIPE NON LINTING 3.25X3.25 (MISCELLANEOUS) ×3 IMPLANT

## 2019-10-16 NOTE — Anesthesia Postprocedure Evaluation (Signed)
Anesthesia Post Note  Patient: Stacey Boyd  Procedure(s) Performed: CATARACT EXTRACTION PHACO AND INTRAOCULAR LENS PLACEMENT (IOC)right  DIABETIC (Right Eye)     Patient location during evaluation: PACU Anesthesia Type: MAC Level of consciousness: awake Pain management: pain level controlled Vital Signs Assessment: post-procedure vital signs reviewed and stable Respiratory status: respiratory function stable Cardiovascular status: stable Postop Assessment: no apparent nausea or vomiting Anesthetic complications: no    Veda Canning

## 2019-10-16 NOTE — H&P (Signed)
All labs reviewed. Abnormal studies sent to patients PCP when indicated.  Previous H&P reviewed, patient examined, there are NO CHANGES.  Stacey Tiller Porfilio6/1/20217:54 AM

## 2019-10-16 NOTE — Anesthesia Procedure Notes (Signed)
Procedure Name: MAC Date/Time: 10/16/2019 8:04 AM Performed by: Jeannene Patella, CRNA Pre-anesthesia Checklist: Patient identified, Emergency Drugs available, Suction available, Patient being monitored and Timeout performed Patient Re-evaluated:Patient Re-evaluated prior to induction Oxygen Delivery Method: Nasal cannula

## 2019-10-16 NOTE — Anesthesia Preprocedure Evaluation (Signed)
Anesthesia Evaluation  Patient identified by MRN, date of birth, ID band Patient awake    Reviewed: Allergy & Precautions, NPO status , Patient's Chart, lab work & pertinent test results  Airway Mallampati: III  TM Distance: >3 FB Neck ROM: Full    Dental  (+) Chipped   Pulmonary former smoker,    Pulmonary exam normal        Cardiovascular hypertension, Pt. on medications  Rhythm:Regular Rate:Normal     Neuro/Psych Anxiety Depression  Neuromuscular disease (peripheral neuropathy)    GI/Hepatic negative GI ROS,   Endo/Other  diabetes, Type 2Hypothyroidism BMI 30  Renal/GU CRFRenal disease     Musculoskeletal  (+) Arthritis ,   Abdominal   Peds  Hematology   Anesthesia Other Findings   Reproductive/Obstetrics                             Anesthesia Physical  Anesthesia Plan  ASA: III  Anesthesia Plan: MAC   Post-op Pain Management:    Induction: Intravenous  PONV Risk Score and Plan: 2 and TIVA, Midazolam and Treatment may vary due to age or medical condition  Airway Management Planned: Natural Airway and Nasal Cannula  Additional Equipment:   Intra-op Plan:   Post-operative Plan:   Informed Consent: I have reviewed the patients History and Physical, chart, labs and discussed the procedure including the risks, benefits and alternatives for the proposed anesthesia with the patient or authorized representative who has indicated his/her understanding and acceptance.     Dental advisory given  Plan Discussed with: CRNA  Anesthesia Plan Comments:         Anesthesia Quick Evaluation

## 2019-10-16 NOTE — Transfer of Care (Addendum)
Immediate Anesthesia Transfer of Care Note  Patient: Stacey Boyd  Procedure(s) Performed: CATARACT EXTRACTION PHACO AND INTRAOCULAR LENS PLACEMENT (IOC)right  DIABETIC (Right Eye)  Patient Location: PACU  Anesthesia Type: MAC  Level of Consciousness: awake, alert  and patient cooperative  Airway and Oxygen Therapy: Patient Spontanous Breathing and Patient connected to supplemental oxygen  Post-op Assessment: Post-op Vital signs reviewed, Patient's Cardiovascular Status Stable, Respiratory Function Stable, Patent Airway and No signs of Nausea or vomiting  Post-op Vital Signs: Reviewed and stable  Complications: No apparent anesthesia complications

## 2019-10-16 NOTE — Op Note (Signed)
PREOPERATIVE DIAGNOSIS:  Nuclear sclerotic cataract of the right eye.   POSTOPERATIVE DIAGNOSIS:  Cataract   OPERATIVE PROCEDURE:@   SURGEON:  Birder Robson, MD.   ANESTHESIA:  Anesthesiologist: Veda Canning, MD CRNA: Jeannene Patella, CRNA  1.      Managed anesthesia care. 2.      0.79ml of Shugarcaine was instilled in the eye following the paracentesis.   COMPLICATIONS:  None.   TECHNIQUE:   Stop and chop   DESCRIPTION OF PROCEDURE:  The patient was examined and consented in the preoperative holding area where the aforementioned topical anesthesia was applied to the right eye and then brought back to the Operating Room where the right eye was prepped and draped in the usual sterile ophthalmic fashion and a lid speculum was placed. A paracentesis was created with the side port blade and the anterior chamber was filled with viscoelastic. A near clear corneal incision was performed with the steel keratome. A continuous curvilinear capsulorrhexis was performed with a cystotome followed by the capsulorrhexis forceps. Hydrodissection and hydrodelineation were carried out with BSS on a blunt cannula. The lens was removed in a stop and chop  technique and the remaining cortical material was removed with the irrigation-aspiration handpiece. The capsular bag was inflated with viscoelastic and the Technis [id number]  lens was placed in the capsular bag without complication. The remaining viscoelastic was removed from the eye with the irrigation-aspiration handpiece. The wounds were hydrated. The anterior chamber was flushed with BSS and the eye was inflated to physiologic pressure. 0.15ml of Vigamox was placed in the anterior chamber. The wounds were found to be water tight. The eye was dressed with Combigan. The patient was given protective glasses to wear throughout the day and a shield with which to sleep tonight. The patient was also given drops with which to begin a drop regimen today and will  follow-up with me in one day. Implant Name Type Inv. Item Serial No. Manufacturer Lot No. LRB No. Used Action  LENS IOL DIOP 13.0 - [id number] Intraocular Lens LENS IOL DIOP 13.0 [id number]  Right 1 Implanted   Procedure(s) with comments: CATARACT EXTRACTION PHACO AND INTRAOCULAR LENS PLACEMENT (IOC)right  DIABETIC (Right) - 5.29 0:33.7  Electronically signed: Birder Robson 10/16/2019 8:20 AM

## 2019-10-17 ENCOUNTER — Encounter: Payer: Self-pay | Admitting: *Deleted

## 2020-01-02 DIAGNOSIS — N1831 Chronic kidney disease, stage 3a: Secondary | ICD-10-CM | POA: Diagnosis not present

## 2020-01-02 DIAGNOSIS — E1122 Type 2 diabetes mellitus with diabetic chronic kidney disease: Secondary | ICD-10-CM | POA: Diagnosis not present

## 2020-01-02 DIAGNOSIS — R202 Paresthesia of skin: Secondary | ICD-10-CM | POA: Diagnosis not present

## 2020-01-02 DIAGNOSIS — E039 Hypothyroidism, unspecified: Secondary | ICD-10-CM | POA: Diagnosis not present

## 2020-01-18 ENCOUNTER — Ambulatory Visit (INDEPENDENT_AMBULATORY_CARE_PROVIDER_SITE_OTHER): Payer: Medicare HMO | Admitting: Nurse Practitioner

## 2020-01-18 ENCOUNTER — Other Ambulatory Visit: Payer: Self-pay

## 2020-01-18 ENCOUNTER — Encounter: Payer: Self-pay | Admitting: Nurse Practitioner

## 2020-01-18 VITALS — BP 113/68 | HR 76 | Temp 98.3°F | Resp 16 | Ht 63.0 in | Wt 172.6 lb

## 2020-01-18 DIAGNOSIS — E119 Type 2 diabetes mellitus without complications: Secondary | ICD-10-CM

## 2020-01-18 DIAGNOSIS — E7800 Pure hypercholesterolemia, unspecified: Secondary | ICD-10-CM | POA: Diagnosis not present

## 2020-01-18 DIAGNOSIS — J0140 Acute pansinusitis, unspecified: Secondary | ICD-10-CM | POA: Diagnosis not present

## 2020-01-18 DIAGNOSIS — I10 Essential (primary) hypertension: Secondary | ICD-10-CM

## 2020-01-18 LAB — POCT GLYCOSYLATED HEMOGLOBIN (HGB A1C): Hemoglobin A1C: 6.9 % — AB (ref 4.0–5.6)

## 2020-01-18 MED ORDER — AMOXICILLIN 500 MG PO CAPS: 500.0000 mg | ORAL_CAPSULE | Freq: Three times a day (TID) | ORAL | 0 refills | Status: DC

## 2020-01-18 NOTE — Progress Notes (Signed)
[facility] [place], [place] [address]  Internal MEDICINE  Office Visit Note  Patient Name: Stacey Boyd  [id number]  [id number]  Date of Service: 02/05/2020  Chief Complaint  Patient presents with  . Follow-up    sinusitis, refill request  . Diabetes  . Depression  . Hyperlipidemia  . Hypertension  . Quality Metric Gaps    HepC, ophthalmology exam, foot exam    The patient is here for routine follow up visit. She has history of diabetes. Blood sugars are doing better. Her HgbA1c is 6.9 today, sown from 80 at her last visit. Her blood pressure is well managed. She states that she does have chronic post nasal drip, frontal sinus headache, scratchy throat, and mild cough. This has been off and on for a few weeks. She denies fever. She has not been exposed to anyone with COVID 19. She has been fully vaccinated against COVID 19. She denies fever, chills, or body aches. She would like to get flu vaccine today, but due to sinus issues, will hold off on that for now.       Current Medication: Outpatient Encounter Medications as of 01/18/2020  Medication Sig  . Acetaminophen (TYLENOL ARTHRITIS PAIN PO) Take by mouth as needed.  Marland Kitchen atorvastatin (LIPITOR) 10 MG tablet TAKE 1 TABLET (10 MG TOTAL) BY MOUTH DAILY.  . brimonidine (ALPHAGAN) 0.2 % ophthalmic solution   . brinzolamide (AZOPT) 1 % ophthalmic suspension Place 1 drop into both eyes 2 (two) times daily.  . Cholecalciferol (VITAMIN D3) 5000 units TABS Take 5,000 Units by mouth daily.   . dorzolamide-timolol (COSOPT) 22.3-6.8 MG/ML ophthalmic solution Place 1 drop into both eyes 2 (two) times daily.   . fexofenadine (ALLEGRA) 180 MG tablet Take 180 mg by mouth daily.  Marland Kitchen latanoprost (XALATAN) 0.005 % ophthalmic solution 1 drop nightly.  . levothyroxine (SYNTHROID) 25 MCG tablet Take 1 tablet (25 mcg total) by mouth daily before breakfast.  . lisinopril-hydrochlorothiazide (ZESTORETIC) 20-12.5 MG tablet Take  1 tablet by mouth daily. MUST SCHEDULE ANNUAL EXAM FOR FURTHER REFILLS  . Melatonin 10 MG TABS Take 20 mg by mouth as needed.   . metFORMIN (GLUCOPHAGE) 500 MG tablet Take 1 tablet (500 mg total) by mouth 2 (two) times daily with a meal.  . Multiple Vitamins-Minerals (SENTRY SENIOR) TABS Take by mouth daily. Eye vitamin  . propranolol (INDERAL) 40 MG tablet Take 0.5 tablets (20 mg total) by mouth 2 (two) times daily.  Marland Kitchen rOPINIRole (REQUIP) 2 MG tablet TAKE 1 TABLET (2 MG TOTAL) BY MOUTH NIGHTLY.  . valACYclovir (VALTREX) 1000 MG tablet Take 1 tablet (1,000 mg total) by mouth as directed. Take 2 by mouth at onset then 2 by mouth 12 hours later.  . valACYclovir (VALTREX) 500 MG tablet Take 1 tablet (500 mg total) by mouth daily.  . [DISCONTINUED] ketorolac (ACULAR) 0.4 % SOLN   . amoxicillin (AMOXIL) 500 MG capsule Take 1 capsule (500 mg total) by mouth 3 (three) times daily.  . QUEtiapine (SEROQUEL) 100 MG tablet Take 1.5 tablets (150 mg total) by mouth at bedtime. For mood and psychosis (Patient not taking: Reported on 01/18/2020)  . traZODone (DESYREL) 50 MG tablet Take 50 mg by mouth as needed.  (Patient not taking: Reported on 01/18/2020)  . zolpidem (AMBIEN) 5 MG tablet TAKE 1 TABLET AT BEDTIME AS NEEDED FOR SLEEP (Patient not taking: Reported on 01/18/2020)  . [DISCONTINUED] fluticasone (FLONASE) 50 MCG/ACT nasal spray Place 2 sprays into both  nostrils at bedtime. (Patient not taking: Reported on 01/18/2020)  . [DISCONTINUED] ibuprofen (ADVIL) 800 MG tablet Take 1 tablet po two to three times daily as needed for pain. (Patient not taking: Reported on 01/18/2020)   No facility-administered encounter medications on file as of 01/18/2020.    Surgical History: Past Surgical History:  Procedure Laterality Date  . ABDOMINAL HYSTERECTOMY  2005  . APPENDECTOMY  1980  . CARDIAC CATHETERIZATION Left 09/17/2015   Procedure: Left Heart Cath and Coronary Angiography;  Surgeon: Teodoro Spray, MD;  Location: [facility];  Service: Cardiovascular;  Laterality: Left;  . CARPAL TUNNEL RELEASE    . CATARACT EXTRACTION W/PHACO Left 09/25/2019   Procedure: CATARACT EXTRACTION PHACO AND INTRAOCULAR LENS PLACEMENT (IOC) LEFT   4.45  00:36.1;  Surgeon: Birder Robson, MD;  Location: [facility];  Service: Ophthalmology;  Laterality: Left;  . CATARACT EXTRACTION W/PHACO Right 10/16/2019   Procedure: CATARACT EXTRACTION PHACO AND INTRAOCULAR LENS PLACEMENT (IOC)right  DIABETIC;  Surgeon: Birder Robson, MD;  Location: [facility];  Service: Ophthalmology;  Laterality: Right;  5.29 0:33.7  . COLONOSCOPY WITH PROPOFOL N/A 11/07/2017   Procedure: COLONOSCOPY WITH PROPOFOL;  Surgeon: Jonathon Bellows, MD;  Location: [facility];  Service: Gastroenterology;  Laterality: N/A;  . FOOT SURGERY Left    2 nodules removed  . FRACTURE SURGERY Right    wrist  . WRIST FRACTURE SURGERY Right     Medical History: Past Medical History:  Diagnosis Date  . Allergy   . Arthritis    legs  . Cancer ([place])    skin cancer on right shoulder.   . Depression   . Diabetes mellitus without complication ([place])   . Diverticulitis   . Dyspnea    with exertion  . Glaucoma   . Hyperlipidemia   . Hypertension    controlled on meds  . Hypothyroidism   . Osteoporosis   . Shoulder pain, left    and arm  . Thyroid disease   . Wrist fracture 2001   right    Family History: Family History  Problem Relation Age of Onset  . Heart disease Mother        h/o rheumatic fever  . Heart disease Father   . Stroke Father   . Diabetes Sister   . Diabetes Brother   . Diabetes Sister   . Mental illness Other   . Colon cancer Neg Hx   . Breast cancer Neg Hx     Social History   Socioeconomic History  . Marital status: Divorced    Spouse name: Not on file  . Number of children: 3  . Years of education: Not on file  . Highest education level: 10th grade  Occupational History  . Not on file  Tobacco Use   . Smoking status: Former Smoker    Packs/day: 0.25    Years: 10.00    Pack years: 2.50    Types: Cigarettes    Quit date: 09/17/1979    Years since quitting: 40.4  . Smokeless tobacco: Never Used  Vaping Use  . Vaping Use: Never used  Substance and Sexual Activity  . Alcohol use: No  . Drug use: No  . Sexual activity: Yes    Partners: Male    Birth control/protection: None  Other Topics Concern  . Not on file  Social History Narrative   3 kids, local   Lives with daughter as of 2016   Divorced ~2002   Social  Determinants of Health   Financial Resource Strain:   . Difficulty of Paying Living Expenses: Not on file  Food Insecurity:   . Worried About Charity fundraiser in the Last Year: Not on file  . Ran Out of Food in the Last Year: Not on file  Transportation Needs:   . Lack of Transportation (Medical): Not on file  . Lack of Transportation (Non-Medical): Not on file  Physical Activity:   . Days of Exercise per Week: Not on file  . Minutes of Exercise per Session: Not on file  Stress:   . Feeling of Stress : Not on file  Social Connections:   . Frequency of Communication with Friends and Family: Not on file  . Frequency of Social Gatherings with Friends and Family: Not on file  . Attends Religious Services: Not on file  . Active Member of Clubs or Organizations: Not on file  . Attends Archivist Meetings: Not on file  . Marital Status: Not on file  Intimate Partner Violence:   . Fear of Current or Ex-Partner: Not on file  . Emotionally Abused: Not on file  . Physically Abused: Not on file  . Sexually Abused: Not on file      Review of Systems  Constitutional: Positive for fatigue. Negative for activity change, chills and unexpected weight change.  HENT: Positive for congestion, postnasal drip, rhinorrhea, sinus pressure and sinus pain. Negative for sneezing and sore throat.        Scratchy throat.   Respiratory: Negative for cough, chest tightness,  shortness of breath and wheezing.   Cardiovascular: Negative for chest pain and palpitations.  Gastrointestinal: Negative for abdominal pain, constipation, diarrhea, nausea and vomiting.  Endocrine: Negative for cold intolerance, heat intolerance, polydipsia and polyuria.       Improved blood sugar control.   Musculoskeletal: Positive for arthralgias and back pain. Negative for joint swelling and neck pain.  Skin: Negative for rash.  Allergic/Immunologic: Positive for environmental allergies.  Neurological: Positive for headaches. Negative for tremors and numbness.  Hematological: Negative for adenopathy. Does not bruise/bleed easily.  Psychiatric/Behavioral: Positive for dysphoric mood. Negative for behavioral problems (Depression), sleep disturbance and suicidal ideas. The patient is nervous/anxious.        Patient continues to see psychiatry.     Today's Vitals   01/18/20 0949  BP: 113/68  Pulse: 76  Resp: 16  Temp: 98.3 F (36.8 C)  SpO2: 98%  Weight: 172 lb 9.6 oz (78.3 kg)  Height: 5\' 3"  (1.6 m)   Body mass index is 30.57 kg/m.  Physical Exam Vitals and nursing note reviewed.  Constitutional:      General: She is not in acute distress.    Appearance: Normal appearance. She is well-developed. She is not diaphoretic.  HENT:     Head: Normocephalic and atraumatic.     Right Ear: Tympanic membrane is erythematous and bulging.     Left Ear: Tympanic membrane is erythematous and bulging.     Nose: Congestion present.     Right Turbinates: Swollen.     Left Turbinates: Swollen.     Right Sinus: Maxillary sinus tenderness and frontal sinus tenderness present.     Left Sinus: Maxillary sinus tenderness and frontal sinus tenderness present.     Mouth/Throat:     Pharynx: Posterior oropharyngeal erythema present. No oropharyngeal exudate.  Eyes:     Pupils: Pupils are equal, round, and reactive to light.  Neck:     Thyroid:  No thyromegaly.     Vascular: No JVD.      Trachea: No tracheal deviation.  Cardiovascular:     Rate and Rhythm: Normal rate and regular rhythm.     Heart sounds: Normal heart sounds. No murmur heard.  No friction rub. No gallop.   Pulmonary:     Effort: Pulmonary effort is normal. No respiratory distress.     Breath sounds: Normal breath sounds. No wheezing or rales.  Chest:     Chest wall: No tenderness.  Abdominal:     Palpations: Abdomen is soft.  Musculoskeletal:        General: Normal range of motion.     Cervical back: Normal range of motion and neck supple.  Lymphadenopathy:     Cervical: Cervical adenopathy present.  Skin:    General: Skin is warm and dry.  Neurological:     Mental Status: She is alert and oriented to person, place, and time. Mental status is at baseline.     Cranial Nerves: No cranial nerve deficit.  Psychiatric:        Attention and Perception: Attention and perception normal.        Mood and Affect: Affect normal. Mood is depressed.        Speech: Speech normal.        Behavior: Behavior normal. Behavior is cooperative.        Thought Content: Thought content normal.        Cognition and Memory: Cognition is impaired.        Judgment: Judgment normal.     Comments: Patient is at her baseline.     Assessment/Plan: 1. Acute non-recurrent pansinusitis Start amoxicillin 500mg  three times daily for next ten days. Rest and increase fluids. Take OTC medication as needed and as indicated to relieve acute symptoms. - amoxicillin (AMOXIL) 500 MG capsule; Take 1 capsule (500 mg total) by mouth 3 (three) times daily.  Dispense: 30 capsule; Refill: 0  2. Type 2 diabetes mellitus without complication, without long-term current use of insulin (HCC) - POCT HgB A1C 6.9 today. Continue metformin as prescribed. Monitor blood sugars closely.   3. Essential hypertension, benign Continue bp medication as prescribed   4. Pure hypercholesterolemia Continue atorvastatin as prescribed.   General Counseling:  ixel boehning understanding of the findings of todays visit and agrees with plan of treatment. I have discussed any further diagnostic evaluation that may be needed or ordered today. We also reviewed her medications today. she has been encouraged to call the office with any questions or concerns that should arise related to todays visit.  This patient was seen by Leretha Pol FNP Collaboration with Dr Lavera Guise as a part of collaborative care agreement  Orders Placed This Encounter  Procedures  . POCT HgB A1C    Meds ordered this encounter  Medications  . amoxicillin (AMOXIL) 500 MG capsule    Sig: Take 1 capsule (500 mg total) by mouth 3 (three) times daily.    Dispense:  30 capsule    Refill:  0    Order Specific Question:   Supervising Provider    Answer:   Lavera Guise [[id number]]    Total time spent: 30 Minutes   Time spent includes review of chart, medications, test results, and follow up plan with the patient.      Dr Lavera Guise Internal medicine

## 2020-02-05 DIAGNOSIS — J0141 Acute recurrent pansinusitis: Secondary | ICD-10-CM | POA: Insufficient documentation

## 2020-02-07 ENCOUNTER — Encounter: Payer: Self-pay | Admitting: Nurse Practitioner

## 2020-02-07 ENCOUNTER — Other Ambulatory Visit: Payer: Self-pay

## 2020-02-07 ENCOUNTER — Ambulatory Visit (INDEPENDENT_AMBULATORY_CARE_PROVIDER_SITE_OTHER): Payer: Medicare HMO | Admitting: Nurse Practitioner

## 2020-02-07 VITALS — BP 164/70 | HR 87 | Temp 97.5°F | Resp 16 | Ht 63.0 in | Wt 174.2 lb

## 2020-02-07 DIAGNOSIS — R42 Dizziness and giddiness: Secondary | ICD-10-CM | POA: Diagnosis not present

## 2020-02-07 DIAGNOSIS — I10 Essential (primary) hypertension: Secondary | ICD-10-CM

## 2020-02-07 DIAGNOSIS — J0141 Acute recurrent pansinusitis: Secondary | ICD-10-CM

## 2020-02-07 MED ORDER — AZITHROMYCIN 250 MG PO TABS: ORAL_TABLET | ORAL | 0 refills | Status: DC

## 2020-02-07 MED ORDER — MECLIZINE HCL 12.5 MG PO TABS: 12.5000 mg | ORAL_TABLET | Freq: Two times a day (BID) | ORAL | 0 refills | Status: DC | PRN

## 2020-02-07 NOTE — Progress Notes (Signed)
[facility] [place], [place] [address]  Internal MEDICINE  Office Visit Note  Patient Name: Stacey Boyd  [id number]  [id number]  Date of Service: 02/07/2020   Pt is here for a sick visit.  Chief Complaint  Patient presents with  . Acute Visit    took last antibiotic sunday   . Sinusitis    drainage   . Headache     The patient presents for sick visit. States that she started having "A LOT" of post nasal drip with headache, eye soreness, and cough last night. She was recently treated for sinus infection. Was started on amoxicillin 500mg  three times daily for 10 days. She finished last antibiotic on Sunday. States that she was outside for very short period of time yesterday, and last night is when symptoms started. She states that she also has a sore throat and noted a sore in her nose which she noted yesterday. Since last night, she has also had vertigo.        Current Medication:  Outpatient Encounter Medications as of 02/07/2020  Medication Sig  . Acetaminophen (TYLENOL ARTHRITIS PAIN PO) Take by mouth as needed.  Marland Kitchen atorvastatin (LIPITOR) 10 MG tablet TAKE 1 TABLET (10 MG TOTAL) BY MOUTH DAILY.  Marland Kitchen azithromycin (ZITHROMAX) 250 MG tablet z-pack - take as directed for 5 days  . brimonidine (ALPHAGAN) 0.2 % ophthalmic solution   . brinzolamide (AZOPT) 1 % ophthalmic suspension Place 1 drop into both eyes 2 (two) times daily.  . Cholecalciferol (VITAMIN D3) 5000 units TABS Take 5,000 Units by mouth daily.   . dorzolamide-timolol (COSOPT) 22.3-6.8 MG/ML ophthalmic solution Place 1 drop into both eyes 2 (two) times daily.   . fexofenadine (ALLEGRA) 180 MG tablet Take 180 mg by mouth daily.  Marland Kitchen latanoprost (XALATAN) 0.005 % ophthalmic solution 1 drop nightly.  . levothyroxine (SYNTHROID) 25 MCG tablet Take 1 tablet (25 mcg total) by mouth daily before breakfast.  . lisinopril-hydrochlorothiazide (ZESTORETIC) 20-12.5 MG tablet Take 1 tablet by mouth  daily. MUST SCHEDULE ANNUAL EXAM FOR FURTHER REFILLS  . meclizine (ANTIVERT) 12.5 MG tablet Take 1 tablet (12.5 mg total) by mouth 2 (two) times daily as needed for dizziness.  . Melatonin 10 MG TABS Take 20 mg by mouth as needed.   . metFORMIN (GLUCOPHAGE) 500 MG tablet Take 1 tablet (500 mg total) by mouth 2 (two) times daily with a meal.  . Multiple Vitamins-Minerals (SENTRY SENIOR) TABS Take by mouth daily. Eye vitamin  . propranolol (INDERAL) 40 MG tablet Take 0.5 tablets (20 mg total) by mouth 2 (two) times daily.  Marland Kitchen rOPINIRole (REQUIP) 2 MG tablet TAKE 1 TABLET (2 MG TOTAL) BY MOUTH NIGHTLY.  . valACYclovir (VALTREX) 1000 MG tablet Take 1 tablet (1,000 mg total) by mouth as directed. Take 2 by mouth at onset then 2 by mouth 12 hours later.  . valACYclovir (VALTREX) 500 MG tablet Take 1 tablet (500 mg total) by mouth daily.  Marland Kitchen zolpidem (AMBIEN) 5 MG tablet TAKE 1 TABLET AT BEDTIME AS NEEDED FOR SLEEP (Patient not taking: Reported on 01/18/2020)  . [DISCONTINUED] amoxicillin (AMOXIL) 500 MG capsule Take 1 capsule (500 mg total) by mouth 3 (three) times daily.  . [DISCONTINUED] QUEtiapine (SEROQUEL) 100 MG tablet Take 1.5 tablets (150 mg total) by mouth at bedtime. For mood and psychosis (Patient not taking: Reported on 01/18/2020)  . [DISCONTINUED] traZODone (DESYREL) 50 MG tablet Take 50 mg by mouth as needed.  (Patient not taking: Reported  on 01/18/2020)   No facility-administered encounter medications on file as of 02/07/2020.      Medical History: Past Medical History:  Diagnosis Date  . Allergy   . Arthritis    legs  . Cancer ([place])    skin cancer on right shoulder.   . Depression   . Diabetes mellitus without complication ([place])   . Diverticulitis   . Dyspnea    with exertion  . Glaucoma   . Hyperlipidemia   . Hypertension    controlled on meds  . Hypothyroidism   . Osteoporosis   . Shoulder pain, left    and arm  . Thyroid disease   . Wrist fracture 2001   right      Today's Vitals   02/07/20 1428  BP: (!) 164/70  Pulse: 87  Resp: 16  Temp: (!) 97.5 F (36.4 C)  SpO2: 99%  Weight: 174 lb 3.2 oz (79 kg)  Height: 5\' 3"  (1.6 m)   Body mass index is 30.86 kg/m.  Review of Systems  Constitutional: Positive for fatigue. Negative for activity change, chills, fever and unexpected weight change.  HENT: Positive for congestion, postnasal drip, rhinorrhea, sinus pressure and sinus pain. Negative for sneezing and sore throat.        Scratchy throat.   Respiratory: Negative for cough, chest tightness, shortness of breath and wheezing.   Cardiovascular: Negative for chest pain and palpitations.       Blood pressure a bit elevated today. She has taken two doses of sudafed today.  Gastrointestinal: Negative for abdominal pain, constipation, diarrhea, nausea and vomiting.  Endocrine: Negative for cold intolerance, heat intolerance, polydipsia and polyuria.  Musculoskeletal: Positive for arthralgias and back pain. Negative for joint swelling and neck pain.  Skin: Negative for rash.  Allergic/Immunologic: Positive for environmental allergies.  Neurological: Positive for dizziness and headaches. Negative for tremors and numbness.  Hematological: Negative for adenopathy. Does not bruise/bleed easily.  Psychiatric/Behavioral: Negative for behavioral problems (Depression), dysphoric mood, sleep disturbance and suicidal ideas. The patient is not nervous/anxious.     Physical Exam Vitals and nursing note reviewed.  Constitutional:      General: She is not in acute distress.    Appearance: Normal appearance. She is well-developed. She is ill-appearing. She is not diaphoretic.  HENT:     Head: Normocephalic and atraumatic.     Right Ear: Tympanic membrane is erythematous and bulging.     Left Ear: Tympanic membrane is erythematous and bulging.     Nose: Congestion present.     Right Sinus: Frontal sinus tenderness present.     Left Sinus: Frontal sinus  tenderness present.     Mouth/Throat:     Pharynx: Pharyngeal swelling and posterior oropharyngeal erythema present. No oropharyngeal exudate.  Eyes:     Pupils: Pupils are equal, round, and reactive to light.  Neck:     Thyroid: No thyromegaly.     Vascular: No JVD.     Trachea: No tracheal deviation.  Cardiovascular:     Rate and Rhythm: Normal rate and regular rhythm.     Heart sounds: Normal heart sounds. No murmur heard.  No friction rub. No gallop.   Pulmonary:     Effort: Pulmonary effort is normal. No respiratory distress.     Breath sounds: No wheezing or rales.  Chest:     Chest wall: No tenderness.  Abdominal:     General: Bowel sounds are normal.     Palpations: Abdomen is soft.  Musculoskeletal:        General: Normal range of motion.     Cervical back: Normal range of motion and neck supple.  Lymphadenopathy:     Cervical: Cervical adenopathy present.  Skin:    General: Skin is warm and dry.  Neurological:     Mental Status: She is alert and oriented to person, place, and time.     Cranial Nerves: No cranial nerve deficit.  Psychiatric:        Behavior: Behavior normal.        Thought Content: Thought content normal.        Judgment: Judgment normal.    Assessment/Plan: 1. Acute recurrent pansinusitis Will start z-pack. Take as directed for 5 days. Rest and increase fluids. Continue to take OTC medications to improve acute symptoms.  - azithromycin (ZITHROMAX) 250 MG tablet; z-pack - take as directed for 5 days  Dispense: 6 tablet; Refill: 0  2. Vertigo Trial meclizine 12.5mg  tablets up to twice daily as needed for vertigo.  - meclizine (ANTIVERT) 12.5 MG tablet; Take 1 tablet (12.5 mg total) by mouth 2 (two) times daily as needed for dizziness.  Dispense: 30 tablet; Refill: 0  3. Essential hypertension, benign Generally stable. Continue bp medication as prescribed   General Counseling: hartlyn reigel understanding of the findings of todays visit and  agrees with plan of treatment. I have discussed any further diagnostic evaluation that may be needed or ordered today. We also reviewed her medications today. she has been encouraged to call the office with any questions or concerns that should arise related to todays visit.    Counseling:  This patient was seen by [place] with Dr Lavera Guise as a part of collaborative care agreement  Meds ordered this encounter  Medications  . azithromycin (ZITHROMAX) 250 MG tablet    Sig: z-pack - take as directed for 5 days    Dispense:  6 tablet    Refill:  0    Order Specific Question:   Supervising Provider    Answer:   Lavera Guise [place]  . meclizine (ANTIVERT) 12.5 MG tablet    Sig: Take 1 tablet (12.5 mg total) by mouth 2 (two) times daily as needed for dizziness.    Dispense:  30 tablet    Refill:  0    Order Specific Question:   Supervising Provider    Answer:   Lavera Guise [[id number]]    Time spent: 25 Minutes

## 2020-02-26 ENCOUNTER — Encounter: Payer: Self-pay | Admitting: Internal Medicine

## 2020-02-26 ENCOUNTER — Ambulatory Visit (INDEPENDENT_AMBULATORY_CARE_PROVIDER_SITE_OTHER): Payer: Medicare HMO | Admitting: Internal Medicine

## 2020-02-26 ENCOUNTER — Other Ambulatory Visit: Payer: Self-pay

## 2020-02-26 ENCOUNTER — Telehealth: Payer: Self-pay

## 2020-02-26 DIAGNOSIS — G259 Extrapyramidal and movement disorder, unspecified: Secondary | ICD-10-CM

## 2020-02-26 DIAGNOSIS — G4720 Circadian rhythm sleep disorder, unspecified type: Secondary | ICD-10-CM

## 2020-02-26 DIAGNOSIS — R079 Chest pain, unspecified: Secondary | ICD-10-CM | POA: Diagnosis not present

## 2020-02-26 DIAGNOSIS — F3341 Major depressive disorder, recurrent, in partial remission: Secondary | ICD-10-CM

## 2020-02-26 DIAGNOSIS — E782 Mixed hyperlipidemia: Secondary | ICD-10-CM

## 2020-02-26 DIAGNOSIS — E119 Type 2 diabetes mellitus without complications: Secondary | ICD-10-CM | POA: Diagnosis not present

## 2020-02-26 DIAGNOSIS — R112 Nausea with vomiting, unspecified: Secondary | ICD-10-CM

## 2020-02-26 DIAGNOSIS — Z23 Encounter for immunization: Secondary | ICD-10-CM

## 2020-02-26 DIAGNOSIS — R001 Bradycardia, unspecified: Secondary | ICD-10-CM | POA: Diagnosis not present

## 2020-02-26 DIAGNOSIS — E538 Deficiency of other specified B group vitamins: Secondary | ICD-10-CM | POA: Diagnosis not present

## 2020-02-26 DIAGNOSIS — D510 Vitamin B12 deficiency anemia due to intrinsic factor deficiency: Secondary | ICD-10-CM | POA: Diagnosis not present

## 2020-02-26 DIAGNOSIS — I10 Essential (primary) hypertension: Secondary | ICD-10-CM | POA: Diagnosis not present

## 2020-02-26 DIAGNOSIS — F5102 Adjustment insomnia: Secondary | ICD-10-CM | POA: Diagnosis not present

## 2020-02-26 MED ORDER — MIRTAZAPINE 15 MG PO TABS: 15.0000 mg | ORAL_TABLET | Freq: Every day | ORAL | 1 refills | Status: DC

## 2020-02-26 MED ORDER — SERTRALINE HCL 25 MG PO TABS: ORAL_TABLET | ORAL | 1 refills | Status: DC

## 2020-02-26 MED ORDER — METFORMIN HCL 500 MG PO TABS: 500.0000 mg | ORAL_TABLET | Freq: Two times a day (BID) | ORAL | 1 refills | Status: DC

## 2020-02-26 NOTE — Progress Notes (Signed)
[facility] [place], [place] [address]  Internal MEDICINE  Office Visit Note  Patient Name: Stacey Boyd  [id number]  [id number]  Date of Service: 02/27/2020  Chief Complaint  Patient presents with  . Acute Visit    pt doesnt want strong meds for panic/anxiety, she is having trouble falling asleep and staying asleep, pt feels nauseous and vomits when she goes to bed, pain on right side under ribs occasionally, feels weak all the time, if she stands too long/strenuous activity pt gets dizzy   . Diabetes  . Depression  . Hyperlipidemia  . Hypertension  . policy update form    received    HPI Pt is here for multiple complaints today. She has been seen by multiple providers. She has been suffering from major depression, has been having problem sleeping and has hallucinations. Listens voices. Pt can sleep during the day, but cannot sleep at night, she did work during 2nd and 3rd shift most of her life.Pt was started on Haldol and some other antipsychotic, started having tremors, she was started on Requip and Propranolol, her blood pressure is very low today She has been having episodes of vomiting as well. She thinks it might be her gallbladder, she has tried some other antipsychotic medications as well. Pt is not in a good living situation and is trying to get hr own place to live, feels anxious and depressed at times  Diabetes is under good control.   Current Medication: Outpatient Encounter Medications as of 02/26/2020  Medication Sig  . Acetaminophen (TYLENOL ARTHRITIS PAIN PO) Take by mouth as needed.  . Cholecalciferol (VITAMIN D3) 5000 units TABS Take 5,000 Units by mouth daily.   . dorzolamide-timolol (COSOPT) 22.3-6.8 MG/ML ophthalmic solution Place 1 drop into both eyes 2 (two) times daily.   . fexofenadine (ALLEGRA) 180 MG tablet Take 180 mg by mouth daily.  Marland Kitchen latanoprost (XALATAN) 0.005 % ophthalmic solution 1 drop nightly.  . Melatonin 10 MG  TABS Take 20 mg by mouth as needed.   . metFORMIN (GLUCOPHAGE) 500 MG tablet Take 1 tablet (500 mg total) by mouth 2 (two) times daily with a meal.  . Multiple Vitamins-Minerals (SENTRY SENIOR) TABS Take by mouth daily. Eye vitamin  . valACYclovir (VALTREX) 1000 MG tablet Take 1 tablet (1,000 mg total) by mouth as directed. Take 2 by mouth at onset then 2 by mouth 12 hours later.  . [DISCONTINUED] atorvastatin (LIPITOR) 10 MG tablet TAKE 1 TABLET (10 MG TOTAL) BY MOUTH DAILY.  . [DISCONTINUED] levothyroxine (SYNTHROID) 25 MCG tablet Take 1 tablet (25 mcg total) by mouth daily before breakfast.  . [DISCONTINUED] lisinopril-hydrochlorothiazide (ZESTORETIC) 20-12.5 MG tablet Take 1 tablet by mouth daily. MUST SCHEDULE ANNUAL EXAM FOR FURTHER REFILLS  . [DISCONTINUED] metFORMIN (GLUCOPHAGE) 500 MG tablet Take 1 tablet (500 mg total) by mouth 2 (two) times daily with a meal.  . [DISCONTINUED] propranolol (INDERAL) 40 MG tablet Take 0.5 tablets (20 mg total) by mouth 2 (two) times daily.  . [DISCONTINUED] rOPINIRole (REQUIP) 2 MG tablet TAKE 1 TABLET (2 MG TOTAL) BY MOUTH NIGHTLY.  . mirtazapine (REMERON) 15 MG tablet Take 1 tablet (15 mg total) by mouth at bedtime.  . sertraline (ZOLOFT) 25 MG tablet Take one tab in the am for one week, then increase it to 2 a day in am  . [DISCONTINUED] azithromycin (ZITHROMAX) 250 MG tablet z-pack - take as directed for 5 days (Patient not taking: Reported on 02/26/2020)  . [DISCONTINUED]  brimonidine (ALPHAGAN) 0.2 % ophthalmic solution  (Patient not taking: Reported on 02/26/2020)  . [DISCONTINUED] brinzolamide (AZOPT) 1 % ophthalmic suspension Place 1 drop into both eyes 2 (two) times daily. (Patient not taking: Reported on 02/26/2020)  . [DISCONTINUED] meclizine (ANTIVERT) 12.5 MG tablet Take 1 tablet (12.5 mg total) by mouth 2 (two) times daily as needed for dizziness. (Patient not taking: Reported on 02/26/2020)  . [DISCONTINUED] valACYclovir (VALTREX) 500 MG  tablet Take 1 tablet (500 mg total) by mouth daily. (Patient not taking: Reported on 02/26/2020)  . [DISCONTINUED] zolpidem (AMBIEN) 5 MG tablet TAKE 1 TABLET AT BEDTIME AS NEEDED FOR SLEEP (Patient not taking: Reported on 02/26/2020)   No facility-administered encounter medications on file as of 02/26/2020.    Surgical History: Past Surgical History:  Procedure Laterality Date  . ABDOMINAL HYSTERECTOMY  2005  . APPENDECTOMY  1980  . CARDIAC CATHETERIZATION Left 09/17/2015   Procedure: Left Heart Cath and Coronary Angiography;  Surgeon: Teodoro Spray, MD;  Location: [place] [facility];  Service: Cardiovascular;  Laterality: Left;  . CARPAL TUNNEL RELEASE    . CATARACT EXTRACTION W/PHACO Left 09/25/2019   Procedure: CATARACT EXTRACTION PHACO AND INTRAOCULAR LENS PLACEMENT (IOC) LEFT   4.45  00:36.1;  Surgeon: Birder Robson, MD;  Location: [place];  Service: Ophthalmology;  Laterality: Left;  . CATARACT EXTRACTION W/PHACO Right 10/16/2019   Procedure: CATARACT EXTRACTION PHACO AND INTRAOCULAR LENS PLACEMENT (IOC)right  DIABETIC;  Surgeon: Birder Robson, MD;  Location: [place];  Service: Ophthalmology;  Laterality: Right;  5.29 0:33.7  . COLONOSCOPY WITH PROPOFOL N/A 11/07/2017   Procedure: COLONOSCOPY WITH PROPOFOL;  Surgeon: Jonathon Bellows, MD;  Location: [facility];  Service: Gastroenterology;  Laterality: N/A;  . FOOT SURGERY Left    2 nodules removed  . FRACTURE SURGERY Right    wrist  . WRIST FRACTURE SURGERY Right     Medical History: Past Medical History:  Diagnosis Date  . Allergy   . Anxiety   . Arthritis    legs  . Cancer ([place])    skin cancer on right shoulder.   . Depression   . Diabetes mellitus without complication ([place])   . Diverticulitis   . Dyspnea    with exertion  . Glaucoma   . Hyperlipidemia   . Hypertension    controlled on meds  . Hypothyroidism   . Osteoporosis   . Shoulder pain, left    and arm  . Thyroid disease    . Wrist fracture 2001   right    Family History: Family History  Problem Relation Age of Onset  . Heart disease Mother        h/o rheumatic fever  . Heart disease Father   . Stroke Father   . Diabetes Sister   . Diabetes Brother   . Diabetes Sister   . Mental illness Other   . Colon cancer Neg Hx   . Breast cancer Neg Hx     Social History   Socioeconomic History  . Marital status: Divorced    Spouse name: Not on file  . Number of children: 3  . Years of education: Not on file  . Highest education level: 10th grade  Occupational History  . Not on file  Tobacco Use  . Smoking status: Former Smoker    Packs/day: 0.25    Years: 10.00    Pack years: 2.50    Types: Cigarettes    Quit date: 09/17/1979    Years  since quitting: 40.4  . Smokeless tobacco: Never Used  Vaping Use  . Vaping Use: Never used  Substance and Sexual Activity  . Alcohol use: No  . Drug use: No  . Sexual activity: Yes    Partners: Male    Birth control/protection: None  Other Topics Concern  . Not on file  Social History Narrative   3 kids, local   Lives with daughter as of 2016   Divorced ~2002   Social Determinants of Health   Financial Resource Strain:   . Difficulty of Paying Living Expenses: Not on file  Food Insecurity:   . Worried About Charity fundraiser in the Last Year: Not on file  . Ran Out of Food in the Last Year: Not on file  Transportation Needs:   . Lack of Transportation (Medical): Not on file  . Lack of Transportation (Non-Medical): Not on file  Physical Activity:   . Days of Exercise per Week: Not on file  . Minutes of Exercise per Session: Not on file  Stress:   . Feeling of Stress : Not on file  Social Connections:   . Frequency of Communication with Friends and Family: Not on file  . Frequency of Social Gatherings with Friends and Family: Not on file  . Attends Religious Services: Not on file  . Active Member of Clubs or Organizations: Not on file  .  Attends Archivist Meetings: Not on file  . Marital Status: Not on file  Intimate Partner Violence:   . Fear of Current or Ex-Partner: Not on file  . Emotionally Abused: Not on file  . Physically Abused: Not on file  . Sexually Abused: Not on file      Review of Systems  Constitutional: Negative for chills, diaphoresis and fatigue.  HENT: Negative for ear pain, postnasal drip and sinus pressure.   Eyes: Negative for photophobia, discharge, redness, itching and visual disturbance.  Respiratory: Negative for cough, shortness of breath and wheezing.   Cardiovascular: Negative for chest pain, palpitations and leg swelling.  Gastrointestinal: Positive for nausea and vomiting. Negative for abdominal pain, constipation and diarrhea.  Genitourinary: Negative for dysuria and flank pain.  Musculoskeletal: Negative for arthralgias, back pain, gait problem and neck pain.  Skin: Negative for color change.  Allergic/Immunologic: Negative for environmental allergies and food allergies.  Neurological: Negative for dizziness and headaches.  Hematological: Does not bruise/bleed easily.  Psychiatric/Behavioral: Positive for dysphoric mood, hallucinations and sleep disturbance. Negative for agitation and behavioral problems (depression). The patient is nervous/anxious.     Vital Signs: BP 102/60   Pulse 66   Temp (!) 97.3 F (36.3 C)   Resp 16   Ht 5\' 3"  (1.6 m)   Wt 169 lb 12.8 oz (77 kg)   SpO2 99%   BMI 30.08 kg/m    Physical Exam Constitutional:      General: She is not in acute distress.    Appearance: She is well-developed. She is not diaphoretic.  HENT:     Head: Normocephalic and atraumatic.     Mouth/Throat:     Pharynx: No oropharyngeal exudate.  Eyes:     Pupils: Pupils are equal, round, and reactive to light.  Neck:     Thyroid: No thyromegaly.     Vascular: No JVD.     Trachea: No tracheal deviation.  Cardiovascular:     Rate and Rhythm: Normal rate and  regular rhythm.     Heart sounds: Normal heart sounds.  No murmur heard.  No friction rub. No gallop.   Pulmonary:     Effort: Pulmonary effort is normal. No respiratory distress.     Breath sounds: No wheezing or rales.  Chest:     Chest wall: No tenderness.  Abdominal:     General: Bowel sounds are normal.     Palpations: Abdomen is soft.  Musculoskeletal:        General: Normal range of motion.     Cervical back: Normal range of motion and neck supple.  Lymphadenopathy:     Cervical: No cervical adenopathy.  Skin:    General: Skin is warm and dry.  Neurological:     Mental Status: She is alert and oriented to person, place, and time.     Cranial Nerves: No cranial nerve deficit.     Comments: Resting tremor of her lips and hands   Psychiatric:        Behavior: Behavior normal.        Thought Content: Thought content normal.        Judgment: Judgment normal.     Assessment/Plan: 1. Circadian rhythm sleep disturbance Pt has worked 3rd shift for a long time, now has problem falling asleep at night, can sleep easily during the day, will need further testing like sleep study, avoid Ambien for now   - mirtazapine (REMERON) 15 MG tablet; Take 1 tablet (15 mg total) by mouth at bedtime.  Dispense: 30 tablet; Refill: 1 - B12 and Folate Panel - Fe+TIBC+Fer  2. Recurrent major depressive disorder, in partial remission ([place]) Will start Zoloft low dose . Add antipsychotic therapy later on  - sertraline (ZOLOFT) 25 MG tablet; Take one tab in the am for one week, then increase it to 2 a day in am  Dispense: 60 tablet; Refill: 1 - TSH - T4, free  3. Well controlled diabetes mellitus ([place]) Will monitor renal functions, might want to DC if cr is elevated  - Urinalysis - metFORMIN (GLUCOPHAGE) 500 MG tablet; Take 1 tablet (500 mg total) by mouth 2 (two) times daily with a meal.  Dispense: 180 tablet; Refill: 1  4. Extrapyramidal and movement disorder, unspecified Check ferritin, might  need low dose Gabapentin  - CBC with Differential/Platelet  5. Flu vaccine need - Flu Vaccine MDCK QUAD PF  6. Bradycardia Will dc her propranolol for now , dc Zestoretic as well, orthostatic hypotension present  - EKG 12-Lead - TSH - T4, free  hold synthroid for now   7. Mixed hyperlipidemia Will hold Lipitor for now  - Lipid Panel With LDL/HDL Ratio  8. Non-intractable vomiting with nausea, unspecified vomiting type Might need US of the abdomen   General Counseling: geneive sandstrom understanding of the findings of todays visit and agrees with plan of treatment. I have discussed any further diagnostic evaluation that may be needed or ordered today. We also reviewed her medications today. she has been encouraged to call the office with any questions or concerns that should arise related to todays visit.  Orders Placed This Encounter  Procedures  . Flu Vaccine MDCK QUAD PF  . CBC with Differential/Platelet  . Lipid Panel With LDL/HDL Ratio  . TSH  . T4, free  . Comprehensive metabolic panel  . Urinalysis  . B12 and Folate Panel  . Fe+TIBC+Fer  . EKG 12-Lead    Meds ordered this encounter  Medications  . sertraline (ZOLOFT) 25 MG tablet    Sig: Take one tab in the am for  one week, then increase it to 2 a day in am    Dispense:  60 tablet    Refill:  1  . mirtazapine (REMERON) 15 MG tablet    Sig: Take 1 tablet (15 mg total) by mouth at bedtime.    Dispense:  30 tablet    Refill:  1  . metFORMIN (GLUCOPHAGE) 500 MG tablet    Sig: Take 1 tablet (500 mg total) by mouth 2 (two) times daily with a meal.    Dispense:  180 tablet    Refill:  1    Please note increased dose    Total time spent:55 Minutes Time spent includes review of chart, medications, test results, and follow up plan with the patient.  Pt has complex medical problem, high level critical thinking and decision making,    Dr Lavera Guise Internal medicine

## 2020-02-27 LAB — LIPID PANEL WITH LDL/HDL RATIO
Cholesterol, Total: 182 mg/dL (ref 100–199)
HDL: 34 mg/dL — ABNORMAL LOW (ref >39)
LDL Chol Calc (NIH): 92 mg/dL (ref 0–99)
LDL/HDL Ratio: 2.7 ratio (ref 0.0–3.2)
Triglycerides: 333 mg/dL — ABNORMAL HIGH (ref 0–149)
VLDL Cholesterol Cal: 56 mg/dL — ABNORMAL HIGH (ref 5–40)

## 2020-02-27 LAB — COMPREHENSIVE METABOLIC PANEL
ALT: 13 IU/L (ref 0–32)
AST: 13 IU/L (ref 0–40)
Albumin/Globulin Ratio: 2.2 (ref 1.2–2.2)
Albumin: 4.8 g/dL — ABNORMAL HIGH (ref 3.7–4.7)
Alkaline Phosphatase: 75 IU/L (ref 44–121)
BUN/Creatinine Ratio: 38 — ABNORMAL HIGH (ref 12–28)
BUN: 55 mg/dL — ABNORMAL HIGH (ref 8–27)
Bilirubin Total: 0.3 mg/dL (ref 0.0–1.2)
CO2: 21 mmol/L (ref 20–29)
Calcium: 10.6 mg/dL — ABNORMAL HIGH (ref 8.7–10.3)
Chloride: 105 mmol/L (ref 96–106)
Creatinine, Ser: 1.44 mg/dL — ABNORMAL HIGH (ref 0.57–1.00)
GFR calc Af Amer: 41 mL/min/{1.73_m2} — ABNORMAL LOW (ref >59)
GFR calc non Af Amer: 36 mL/min/{1.73_m2} — ABNORMAL LOW (ref >59)
Globulin, Total: 2.2 g/dL (ref 1.5–4.5)
Glucose: 187 mg/dL — ABNORMAL HIGH (ref 65–99)
Potassium: 4.4 mmol/L (ref 3.5–5.2)
Sodium: 143 mmol/L (ref 134–144)
Total Protein: 7 g/dL (ref 6.0–8.5)

## 2020-02-27 LAB — TSH: TSH: 2.63 u[IU]/mL (ref 0.450–4.500)

## 2020-02-27 LAB — CBC WITH DIFFERENTIAL/PLATELET
Basophils Absolute: 0.1 10*3/uL (ref 0.0–0.2)
Basos: 1 %
EOS (ABSOLUTE): 0.1 10*3/uL (ref 0.0–0.4)
Eos: 1 %
Hematocrit: 38.1 % (ref 34.0–46.6)
Hemoglobin: 13.1 g/dL (ref 11.1–15.9)
Immature Grans (Abs): 0 10*3/uL (ref 0.0–0.1)
Immature Granulocytes: 0 %
Lymphocytes Absolute: 2.2 10*3/uL (ref 0.7–3.1)
Lymphs: 34 %
MCH: 31.6 pg (ref 26.6–33.0)
MCHC: 34.4 g/dL (ref 31.5–35.7)
MCV: 92 fL (ref 79–97)
Monocytes Absolute: 0.4 10*3/uL (ref 0.1–0.9)
Monocytes: 7 %
Neutrophils Absolute: 3.7 10*3/uL (ref 1.4–7.0)
Neutrophils: 57 %
Platelets: 305 10*3/uL (ref 150–450)
RBC: 4.14 x10E6/uL (ref 3.77–5.28)
RDW: 11.5 % — ABNORMAL LOW (ref 11.7–15.4)
WBC: 6.5 10*3/uL (ref 3.4–10.8)

## 2020-02-27 LAB — T4, FREE: Free T4: 1.42 ng/dL (ref 0.82–1.77)

## 2020-02-27 LAB — B12 AND FOLATE PANEL
Folate: >20 ng/mL (ref >3.0)
Vitamin B-12: 696 pg/mL (ref 232–1245)

## 2020-02-27 LAB — IRON,TIBC AND FERRITIN PANEL
Ferritin: 173 ng/mL — ABNORMAL HIGH (ref 15–150)
Iron Saturation: 32 % (ref 15–55)
Iron: 112 ug/dL (ref 27–139)
Total Iron Binding Capacity: 348 ug/dL (ref 250–450)
UIBC: 236 ug/dL (ref 118–369)

## 2020-02-29 ENCOUNTER — Other Ambulatory Visit: Payer: Self-pay | Admitting: Internal Medicine

## 2020-03-11 ENCOUNTER — Ambulatory Visit (INDEPENDENT_AMBULATORY_CARE_PROVIDER_SITE_OTHER): Payer: Medicare HMO | Admitting: Internal Medicine

## 2020-03-11 ENCOUNTER — Other Ambulatory Visit: Payer: Self-pay

## 2020-03-11 ENCOUNTER — Telehealth: Payer: Self-pay

## 2020-03-11 ENCOUNTER — Encounter: Payer: Self-pay | Admitting: Internal Medicine

## 2020-03-11 VITALS — BP 130/86 | HR 53 | Temp 97.3°F | Resp 16 | Ht 63.0 in | Wt 173.2 lb

## 2020-03-11 DIAGNOSIS — E782 Mixed hyperlipidemia: Secondary | ICD-10-CM | POA: Diagnosis not present

## 2020-03-11 DIAGNOSIS — J309 Allergic rhinitis, unspecified: Secondary | ICD-10-CM | POA: Diagnosis not present

## 2020-03-11 DIAGNOSIS — E1122 Type 2 diabetes mellitus with diabetic chronic kidney disease: Secondary | ICD-10-CM | POA: Diagnosis not present

## 2020-03-11 DIAGNOSIS — G4720 Circadian rhythm sleep disorder, unspecified type: Secondary | ICD-10-CM | POA: Diagnosis not present

## 2020-03-11 DIAGNOSIS — R1084 Generalized abdominal pain: Secondary | ICD-10-CM | POA: Diagnosis not present

## 2020-03-11 DIAGNOSIS — F323 Major depressive disorder, single episode, severe with psychotic features: Secondary | ICD-10-CM

## 2020-03-11 DIAGNOSIS — N1831 Chronic kidney disease, stage 3a: Secondary | ICD-10-CM

## 2020-03-11 DIAGNOSIS — H1010 Acute atopic conjunctivitis, unspecified eye: Secondary | ICD-10-CM | POA: Diagnosis not present

## 2020-03-11 MED ORDER — GLIMEPIRIDE 1 MG PO TABS: ORAL_TABLET | ORAL | 3 refills | Status: DC

## 2020-03-11 MED ORDER — ATORVASTATIN CALCIUM 10 MG PO TABS: 10.0000 mg | ORAL_TABLET | Freq: Every day | ORAL | 3 refills | Status: DC

## 2020-03-11 MED ORDER — MIRTAZAPINE 30 MG PO TABS: ORAL_TABLET | ORAL | 1 refills | Status: DC

## 2020-03-11 MED ORDER — SERTRALINE HCL 50 MG PO TABS: 50.0000 mg | ORAL_TABLET | Freq: Every day | ORAL | 3 refills | Status: DC

## 2020-03-11 NOTE — Progress Notes (Signed)
[facility] [place], [place] [address]  Internal MEDICINE  Office Visit Note  Patient Name: Stacey Boyd  [id number]  [id number]  Date of Service: 03/12/2020  Chief Complaint  Patient presents with   Follow-up    pt feels like sleeping med isnt strong enough, if pt wakes up in middle of the night she difficult to go back to sleep   Anxiety   Depression   Diabetes   Hyperlipidemia   Hypertension   policy update form    reviewed    HPI     Current Medication: Outpatient Encounter Medications as of 03/11/2020  Medication Sig   Acetaminophen (TYLENOL ARTHRITIS PAIN PO) Take by mouth as needed.   atorvastatin (LIPITOR) 10 MG tablet Take 1 tablet (10 mg total) by mouth daily. FOR CHOLESTEROL   azelastine (ASTELIN) 0.1 % nasal spray Place 2 sprays into both nostrils 2 (two) times daily. Use in each nostril as directed   Cholecalciferol (VITAMIN D3) 5000 units TABS Take 5,000 Units by mouth daily.    dorzolamide-timolol (COSOPT) 22.3-6.8 MG/ML ophthalmic solution Place 1 drop into both eyes 2 (two) times daily.    fexofenadine (ALLEGRA) 180 MG tablet Take 180 mg by mouth daily.   glimepiride (AMARYL) 1 MG tablet TAKE ONE TAB WITH LUNCH FOR DM   latanoprost (XALATAN) 0.005 % ophthalmic solution 1 drop nightly.   Melatonin 10 MG TABS Take 20 mg by mouth as needed.    metFORMIN (GLUCOPHAGE) 500 MG tablet Take 1 tablet (500 mg total) by mouth 2 (two) times daily with a meal.   mirtazapine (REMERON) 30 MG tablet TAKE ONE TAB PO QHS   Multiple Vitamins-Minerals (SENTRY SENIOR) TABS Take by mouth daily. Eye vitamin   sertraline (ZOLOFT) 50 MG tablet Take 1 tablet (50 mg total) by mouth daily.   valACYclovir (VALTREX) 1000 MG tablet Take 1 tablet (1,000 mg total) by mouth as directed. Take 2 by mouth at onset then 2 by mouth 12 hours later.   [DISCONTINUED] mirtazapine (REMERON) 15 MG tablet Take 1 tablet (15 mg total) by mouth at  bedtime.   [DISCONTINUED] sertraline (ZOLOFT) 25 MG tablet Take one tab in the am for one week, then increase it to 2 a day in am   No facility-administered encounter medications on file as of 03/11/2020.    Surgical History: Past Surgical History:  Procedure Laterality Date   ABDOMINAL HYSTERECTOMY  2005   APPENDECTOMY  1980   CARDIAC CATHETERIZATION Left 09/17/2015   Procedure: Left Heart Cath and Coronary Angiography;  Surgeon: Teodoro Spray, MD;  Location: [place] [facility];  Service: Cardiovascular;  Laterality: Left;   CARPAL TUNNEL RELEASE     CATARACT EXTRACTION W/PHACO Left 09/25/2019   Procedure: CATARACT EXTRACTION PHACO AND INTRAOCULAR LENS PLACEMENT (IOC) LEFT   4.45  00:36.1;  Surgeon: Birder Robson, MD;  Location: [place];  Service: Ophthalmology;  Laterality: Left;   CATARACT EXTRACTION W/PHACO Right 10/16/2019   Procedure: CATARACT EXTRACTION PHACO AND INTRAOCULAR LENS PLACEMENT (IOC)right  DIABETIC;  Surgeon: Birder Robson, MD;  Location: [place];  Service: Ophthalmology;  Laterality: Right;  5.29 0:33.7   COLONOSCOPY WITH PROPOFOL N/A 11/07/2017   Procedure: COLONOSCOPY WITH PROPOFOL;  Surgeon: Jonathon Bellows, MD;  Location[facility];  Service: Gastroenterology;  Laterality: N/A;   FOOT SURGERY Left    2 nodules removed   FRACTURE SURGERY Right    wrist   WRIST FRACTURE SURGERY Right  Medical History: Past Medical History:  Diagnosis Date   Allergy    Anxiety    Arthritis    legs   Cancer ([place])    skin cancer on right shoulder.    Depression    Diabetes mellitus without complication ([place])    Diverticulitis    Dyspnea    with exertion   Glaucoma    Hyperlipidemia    Hypertension    controlled on meds   Hypothyroidism    Osteoporosis    Shoulder pain, left    and arm   Thyroid disease    Wrist fracture 2001   right    Family History: Family History  Problem Relation Age of Onset    Heart disease Mother        h/o rheumatic fever   Heart disease Father    Stroke Father    Diabetes Sister    Diabetes Brother    Diabetes Sister    Mental illness Other    Colon cancer Neg Hx    Breast cancer Neg Hx     Social History   Socioeconomic History   Marital status: Divorced    Spouse name: Not on file   Number of children: 3   Years of education: Not on file   Highest education level: 10th grade  Occupational History   Not on file  Tobacco Use   Smoking status: Former Smoker    Packs/day: 0.25    Years: 10.00    Pack years: 2.50    Types: Cigarettes    Quit date: 09/17/1979    Years since quitting: 40.5   Smokeless tobacco: Never Used  Vaping Use   Vaping Use: Never used  Substance and Sexual Activity   Alcohol use: No   Drug use: No   Sexual activity: Yes    Partners: Male    Birth control/protection: None  Other Topics Concern   Not on file  Social History Narrative   3 kids, local   Lives with daughter as of 2016   Divorced ~2002   Social Determinants of Health   Financial Resource Strain:    Difficulty of Paying Living Expenses: Not on file  Food Insecurity:    Worried About Charity fundraiser in the Last Year: Not on file   [employer] of Food in the Last Year: Not on file  Transportation Needs:    Lack of Transportation (Medical): Not on file   Lack of Transportation (Non-Medical): Not on file  Physical Activity:    Days of Exercise per Week: Not on file   Minutes of Exercise per Session: Not on file  Stress:    Feeling of Stress : Not on file  Social Connections:    Frequency of Communication with Friends and Family: Not on file   Frequency of Social Gatherings with Friends and Family: Not on file   Attends Religious Services: Not on file   Active Member of Clubs or Organizations: Not on file   Attends Archivist Meetings: Not on file   Marital Status: Not on file  Intimate Partner  Violence:    Fear of Current or Ex-Partner: Not on file   Emotionally Abused: Not on file   Physically Abused: Not on file   Sexually Abused: Not on file      Review of Systems  Constitutional: Negative for chills, diaphoresis and fatigue.  HENT: Positive for postnasal drip and rhinorrhea. Negative for ear pain, sinus pressure and sinus pain.  Eyes: Negative for photophobia, discharge, redness, itching and visual disturbance.  Respiratory: Negative for cough, shortness of breath and wheezing.   Cardiovascular: Negative for chest pain, palpitations and leg swelling.  Gastrointestinal: Negative for abdominal pain, constipation, diarrhea, nausea and vomiting.  Genitourinary: Negative for dysuria and flank pain.  Musculoskeletal: Negative for arthralgias, back pain, gait problem and neck pain.  Skin: Negative for color change.  Allergic/Immunologic: Negative for environmental allergies and food allergies.  Neurological: Negative for dizziness and headaches.  Hematological: Does not bruise/bleed easily.  Psychiatric/Behavioral: Negative for agitation, behavioral problems (depression) and hallucinations.    Vital Signs: BP 130/86    Pulse (!) 53    Temp (!) 97.3 F (36.3 C)    Resp 16    Ht 5\' 3"  (1.6 m)    Wt 173 lb 3.2 oz (78.6 kg)    SpO2 98%    BMI 30.68 kg/m    Physical Exam Constitutional:      General: She is not in acute distress.    Appearance: She is well-developed. She is not diaphoretic.  HENT:     Head: Normocephalic and atraumatic.     Mouth/Throat:     Pharynx: No oropharyngeal exudate.  Eyes:     Pupils: Pupils are equal, round, and reactive to light.  Neck:     Thyroid: No thyromegaly.     Vascular: No JVD.     Trachea: No tracheal deviation.  Cardiovascular:     Rate and Rhythm: Normal rate and regular rhythm.     Heart sounds: Normal heart sounds. No murmur heard.  No friction rub. No gallop.   Pulmonary:     Effort: Pulmonary effort is normal. No  respiratory distress.     Breath sounds: No wheezing or rales.  Chest:     Chest wall: No tenderness.  Abdominal:     General: Bowel sounds are normal.     Palpations: Abdomen is soft.  Musculoskeletal:        General: Normal range of motion.     Cervical back: Normal range of motion and neck supple.  Lymphadenopathy:     Cervical: No cervical adenopathy.  Skin:    General: Skin is warm and dry.  Neurological:     Mental Status: She is alert and oriented to person, place, and time.     Cranial Nerves: No cranial nerve deficit.  Psychiatric:        Behavior: Behavior normal.        Thought Content: Thought content normal.        Judgment: Judgment normal.    Assessment/Plan: 1. Major depression with psychotic features ([place]) Pt is improving, no side effects, will increase Zoloft to 50 mg qhs  - sertraline (ZOLOFT) 50 MG tablet; Take 1 tablet (50 mg total) by mouth daily.  Dispense: 30 tablet; Refill: 3  2. Circadian rhythm sleep disturbance Improving, sleeping much better now  - mirtazapine (REMERON) 30 MG tablet; TAKE ONE TAB PO QHS  Dispense: 90 tablet; Refill: 1  3. Allergic rhinoconjunctivitis Severe allergy symptoms, will add nasal spray as prescribed  - Allergy Test - azelastine (ASTELIN) 0.1 % nasal spray; Place 2 sprays into both nostrils 2 (two) times daily. Use in each nostril as directed  Dispense: 30 mL; Refill: 1  4. Abdominal pain, generalized Abdominal pain, nausea and radiation right shoulder - US Abdomen Complete; Future  5. Type 2 diabetes mellitus with stage 3a chronic kidney disease, without long-term current use of insulin (  [place]) Diabetic numbers are within limits, however has elevated Cr, will DC metformin and start Amaryl. - glimepiride (AMARYL) 1 MG tablet; TAKE ONE TAB WITH LUNCH FOR DM  Dispense: 90 tablet; Refill: 3  6. Hypercalcemia Suspected polypharmacy. Will repeat calcium level along with po4 and intact PTH   7. Mixed hyperlipidemia -  atorvastatin (LIPITOR) 10 MG tablet; Take 1 tablet (10 mg total) by mouth daily. FOR CHOLESTEROL  Dispense: 90 tablet; Refill: 3  General Counseling: Tobin verbalizes understanding of the findings of todays visit and agrees with plan of treatment. I have discussed any further diagnostic evaluation that may be needed or ordered today. We also reviewed her medications today. she has been encouraged to call the office with any questions or concerns that should arise related to todays visit.  Orders Placed This Encounter  Procedures   Allergy Test   US Abdomen Complete    Meds ordered this encounter  Medications   sertraline (ZOLOFT) 50 MG tablet    Sig: Take 1 tablet (50 mg total) by mouth daily.    Dispense:  30 tablet    Refill:  3   mirtazapine (REMERON) 30 MG tablet    Sig: TAKE ONE TAB PO QHS    Dispense:  90 tablet    Refill:  1   atorvastatin (LIPITOR) 10 MG tablet    Sig: Take 1 tablet (10 mg total) by mouth daily. FOR CHOLESTEROL    Dispense:  90 tablet    Refill:  3   glimepiride (AMARYL) 1 MG tablet    Sig: TAKE ONE TAB WITH LUNCH FOR DM    Dispense:  90 tablet    Refill:  3   azelastine (ASTELIN) 0.1 % nasal spray    Sig: Place 2 sprays into both nostrils 2 (two) times daily. Use in each nostril as directed    Dispense:  30 mL    Refill:  1    Total time spent 35 Minutes Time spent includes review of chart, medications, test results, and follow up plan with the patient.      Dr Lavera Guise Internal medicine

## 2020-03-11 NOTE — Telephone Encounter (Signed)
Hey u send me a message on this pt about 2 of meds, I have stopped all those, they just need to bring all meds they are taking, no need to bring the old ones. Per Dr Humphrey Rolls

## 2020-03-11 NOTE — Telephone Encounter (Signed)
Pt sister is very sick and she is going out of town to take care of her, Pt will call us to schedule all follow up appts.

## 2020-03-12 MED ORDER — AZELASTINE HCL 0.1 % NA SOLN: 2.0000 | Freq: Two times a day (BID) | NASAL | 1 refills | Status: DC

## 2020-03-13 ENCOUNTER — Telehealth: Payer: Self-pay

## 2020-03-18 NOTE — Telephone Encounter (Signed)
Discussed medication at last visit. Stacey Boyd

## 2020-03-25 ENCOUNTER — Other Ambulatory Visit: Payer: Self-pay | Admitting: Internal Medicine

## 2020-03-25 DIAGNOSIS — F3341 Major depressive disorder, recurrent, in partial remission: Secondary | ICD-10-CM

## 2020-03-25 DIAGNOSIS — G4720 Circadian rhythm sleep disorder, unspecified type: Secondary | ICD-10-CM

## 2020-04-02 ENCOUNTER — Other Ambulatory Visit: Payer: Self-pay

## 2020-04-02 ENCOUNTER — Ambulatory Visit (INDEPENDENT_AMBULATORY_CARE_PROVIDER_SITE_OTHER): Payer: Medicare Other

## 2020-04-02 DIAGNOSIS — R1084 Generalized abdominal pain: Secondary | ICD-10-CM

## 2020-04-15 ENCOUNTER — Encounter: Payer: Self-pay | Admitting: Internal Medicine

## 2020-04-15 ENCOUNTER — Other Ambulatory Visit: Payer: Self-pay

## 2020-04-15 ENCOUNTER — Telehealth: Payer: Self-pay | Admitting: Internal Medicine

## 2020-04-15 ENCOUNTER — Ambulatory Visit (INDEPENDENT_AMBULATORY_CARE_PROVIDER_SITE_OTHER): Payer: Medicare Other | Admitting: Internal Medicine

## 2020-04-15 VITALS — BP 140/80 | HR 78 | Temp 97.8°F | Resp 16 | Ht 63.0 in | Wt 170.0 lb

## 2020-04-15 DIAGNOSIS — J301 Allergic rhinitis due to pollen: Secondary | ICD-10-CM

## 2020-04-15 NOTE — Procedures (Signed)
    [facility] Allergy MQT Recording Form  [facility] [address] [place], [place] [address] Phone [phone] Fax [phone]   Patient Name: Stacey Boyd Age: 74 y.o. Sex: female Date of Service: 04/15/2020  Performing Provider: Allyne Gee MD [facility]         Battery A Back   Site Antigen [facility] Flare  A1 Positive Control 4 6  A2 Negative Control 2 3  A3 American Elm 0 0  A4 Maple Box Elder 0 0  A5 Grass Mix 0 0  A6 Dock Sorrel Mix 0 0  A7 Russian Thistle 0 0  A8 Ragweed Mix 0 0  A9 English Pantain 0 0  A10 Oak Mix  0 0   Battery B Wheal Flare  B1 Lambs Quarters 0 0  B2 Cottonwood 0 0  B3 Pigweed Mix 0 0  B4 Acacia 0 0  B5 Pine Mix 0 0  B6 Privet 0 0  B7 White/Red Mulberry 0 0  B8 Western Water Hemp 0 0  B9 Guatemala Grass 0 0  B10 Melalucea 0 0   Battery C Wheal Flare  C1 Red River Birch 0 0  C2 Eastern Sycamore 0 0  C3 Bahai Grass 4 5  C4 American Beech 0 0  C5 Ash Mix 0 0  C6 Black Willow 0 0  C7 Hickory 0 0  C8 Black Walnut 0 0  C9 Red Cedar 0 0  C10 Sweet Gum  0 0   Battery D Wheal Flare  D1 Cultivated Oat 00 0  D2 Dog Fennel 0 0  D3 Common Mugwort 0 0  D4 Marsh Elder 0 0  D5 Johnson 0 0  D6 Hackberry Tree 0 0  D7 Bayberry Tree 0 0  D8 Cypress, Bald Tree 0 0  D9 Aspergillus Fumigatus 0 0  D10 Alternia  0 0   Battery E Wheal Flare  E1 Dreschlere 0 0  E2 Fusarium Mix 0 0  E3 Cladosporum Sph 0 0  E4 Bipolaris 0 0  E5 Penicillin chrys 0 0  E6 Cladosporum Herb 5 6  E7 Candida 0 0  E8 Aureobasidium 0 0  E9 Rhizopus 0 0  E10 Botrytis  0 0   Battery F Wheal Flare  F1 Aspergillus [place] 0 0  F2 Dust Mite Mix 0 0  F3 Cockroach Mix 0 0  F4 Cat Hair 0 0  F5 Dog Mixed breeds 0 0  F6 Feather Mix 0 0

## 2020-04-18 ENCOUNTER — Ambulatory Visit: Payer: Medicare HMO | Admitting: Nurse Practitioner

## 2020-04-18 NOTE — Progress Notes (Signed)
Allergy test performed 

## 2020-04-23 ENCOUNTER — Other Ambulatory Visit: Payer: Self-pay

## 2020-04-23 ENCOUNTER — Ambulatory Visit (INDEPENDENT_AMBULATORY_CARE_PROVIDER_SITE_OTHER): Payer: Medicare Other | Admitting: Nurse Practitioner

## 2020-04-23 ENCOUNTER — Encounter: Payer: Self-pay | Admitting: Nurse Practitioner

## 2020-04-23 VITALS — BP 152/84 | HR 72 | Temp 97.3°F | Resp 16 | Ht 63.0 in | Wt 171.6 lb

## 2020-04-23 DIAGNOSIS — N1831 Chronic kidney disease, stage 3a: Secondary | ICD-10-CM | POA: Diagnosis not present

## 2020-04-23 DIAGNOSIS — E1122 Type 2 diabetes mellitus with diabetic chronic kidney disease: Secondary | ICD-10-CM | POA: Diagnosis not present

## 2020-04-23 DIAGNOSIS — I10 Essential (primary) hypertension: Secondary | ICD-10-CM | POA: Diagnosis not present

## 2020-04-23 DIAGNOSIS — K7689 Other specified diseases of liver: Secondary | ICD-10-CM

## 2020-04-23 LAB — POCT GLYCOSYLATED HEMOGLOBIN (HGB A1C): Hemoglobin A1C: 6.3 % — AB (ref 4.0–5.6)

## 2020-04-23 MED ORDER — PROPRANOLOL HCL 10 MG PO TABS: 10.0000 mg | ORAL_TABLET | Freq: Two times a day (BID) | ORAL | 2 refills | Status: DC

## 2020-04-23 NOTE — Progress Notes (Signed)
[facility] [place], [place] [address]  Internal MEDICINE  Office Visit Note  Patient Name: Stacey Boyd  [id number]  [id number]  Date of Service: 04/23/2020  Chief Complaint  Patient presents with  . Follow-up  . Depression  . Diabetes  . Hyperlipidemia  . Hypertension  . Anxiety  . policy update form    received    The patient is here for follow up visit.  -blood pressure elevated. Has been under increased stress. Has been having increased number of headaches. Was taken off both lisinopril and propranolol recently.  -blood sugars doing well. HgbA1c 6.3 today. -had ultrasound of the abdomen 04/02/2020. Has simple cyst on left lobe of the liver measuring 2.6X2.4 cm in diameter. Has complicated cyst on right lobe of the lever measuring 9.5 X 9.7 cm in diameter. There is tenderness when this area of the abdomen and back are palpated.       Current Medication: Outpatient Encounter Medications as of 04/23/2020  Medication Sig  . Acetaminophen (TYLENOL ARTHRITIS PAIN PO) Take by mouth as needed.  Marland Kitchen atorvastatin (LIPITOR) 10 MG tablet Take 1 tablet (10 mg total) by mouth daily. FOR CHOLESTEROL  . azelastine (ASTELIN) 0.1 % nasal spray Place 2 sprays into both nostrils 2 (two) times daily. Use in each nostril as directed  . Cholecalciferol (VITAMIN D3) 5000 units TABS Take 5,000 Units by mouth daily.   . dorzolamide-timolol (COSOPT) 22.3-6.8 MG/ML ophthalmic solution Place 1 drop into both eyes 2 (two) times daily.   . fexofenadine (ALLEGRA) 180 MG tablet Take 180 mg by mouth daily.  Marland Kitchen glimepiride (AMARYL) 1 MG tablet TAKE ONE TAB WITH LUNCH FOR DM  . latanoprost (XALATAN) 0.005 % ophthalmic solution 1 drop nightly.  . Melatonin 10 MG TABS Take 20 mg by mouth as needed.   . metFORMIN (GLUCOPHAGE) 500 MG tablet Take 1 tablet (500 mg total) by mouth 2 (two) times daily with a meal.  . mirtazapine (REMERON) 30 MG tablet TAKE ONE TAB PO QHS  . Multiple  Vitamins-Minerals (SENTRY SENIOR) TABS Take by mouth daily. Eye vitamin  . propranolol (INDERAL) 10 MG tablet Take 1 tablet (10 mg total) by mouth 2 (two) times daily.  . sertraline (ZOLOFT) 50 MG tablet Take one tab a day .  . valACYclovir (VALTREX) 1000 MG tablet Take 1 tablet (1,000 mg total) by mouth as directed. Take 2 by mouth at onset then 2 by mouth 12 hours later.   No facility-administered encounter medications on file as of 04/23/2020.    Surgical History: Past Surgical History:  Procedure Laterality Date  . ABDOMINAL HYSTERECTOMY  2005  . APPENDECTOMY  1980  . CARDIAC CATHETERIZATION Left 09/17/2015   Procedure: Left Heart Cath and Coronary Angiography;  Surgeon: Teodoro Spray, MD;  Location: [facility];  Service: Cardiovascular;  Laterality: Left;  . CARPAL TUNNEL RELEASE    . CATARACT EXTRACTION W/PHACO Left 09/25/2019   Procedure: CATARACT EXTRACTION PHACO AND INTRAOCULAR LENS PLACEMENT (IOC) LEFT   4.45  00:36.1;  Surgeon: Birder Robson, MD;  Location: [facility];  Service: Ophthalmology;  Laterality: Left;  . CATARACT EXTRACTION W/PHACO Right 10/16/2019   Procedure: CATARACT EXTRACTION PHACO AND INTRAOCULAR LENS PLACEMENT (IOC)right  DIABETIC;  Surgeon: Birder Robson, MD;  Location: [facility];  Service: Ophthalmology;  Laterality: Right;  5.29 0:33.7  . COLONOSCOPY WITH PROPOFOL N/A 11/07/2017   Procedure: COLONOSCOPY WITH PROPOFOL;  Surgeon: Jonathon Bellows, MD;  Location: [facility];  Service: Gastroenterology;  Laterality: N/A;  . FOOT SURGERY Left    2 nodules removed  . FRACTURE SURGERY Right    wrist  . WRIST FRACTURE SURGERY Right     Medical History: Past Medical History:  Diagnosis Date  . Allergy   . Anxiety   . Arthritis    legs  . Cancer ([place])    skin cancer on right shoulder.   . Depression   . Diabetes mellitus without complication ([place])   . Diverticulitis   . Dyspnea    with exertion  . Glaucoma   .  Hyperlipidemia   . Hypertension    controlled on meds  . Hypothyroidism   . Osteoporosis   . Shoulder pain, left    and arm  . Thyroid disease   . Wrist fracture 2001   right    Family History: Family History  Problem Relation Age of Onset  . Heart disease Mother        h/o rheumatic fever  . Heart disease Father   . Stroke Father   . Diabetes Sister   . Diabetes Brother   . Diabetes Sister   . Mental illness Other   . Colon cancer Neg Hx   . Breast cancer Neg Hx     Social History   Socioeconomic History  . Marital status: Divorced    Spouse name: Not on file  . Number of children: 3  . Years of education: Not on file  . Highest education level: 10th grade  Occupational History  . Not on file  Tobacco Use  . Smoking status: Former Smoker    Packs/day: 0.25    Years: 10.00    Pack years: 2.50    Types: Cigarettes    Quit date: 09/17/1979    Years since quitting: 40.6  . Smokeless tobacco: Never Used  Vaping Use  . Vaping Use: Never used  Substance and Sexual Activity  . Alcohol use: No  . Drug use: No  . Sexual activity: Yes    Partners: Male    Birth control/protection: None  Other Topics Concern  . Not on file  Social History Narrative   3 kids, local   Lives with daughter as of 2016   Divorced ~2002   Social Determinants of Health   Financial Resource Strain:   . Difficulty of Paying Living Expenses: Not on file  Food Insecurity:   . Worried About Charity fundraiser in the Last Year: Not on file  . Ran Out of Food in the Last Year: Not on file  Transportation Needs:   . Lack of Transportation (Medical): Not on file  . Lack of Transportation (Non-Medical): Not on file  Physical Activity:   . Days of Exercise per Week: Not on file  . Minutes of Exercise per Session: Not on file  Stress:   . Feeling of Stress : Not on file  Social Connections:   . Frequency of Communication with Friends and Family: Not on file  . Frequency of Social  Gatherings with Friends and Family: Not on file  . Attends Religious Services: Not on file  . Active Member of Clubs or Organizations: Not on file  . Attends Archivist Meetings: Not on file  . Marital Status: Not on file  Intimate Partner Violence:   . Fear of Current or Ex-Partner: Not on file  . Emotionally Abused: Not on file  . Physically Abused: Not on file  . Sexually Abused: Not  on file      Review of Systems  Constitutional: Positive for activity change. Negative for chills, fatigue and unexpected weight change.       Less active due to back and belly pain  HENT: Negative for congestion, postnasal drip, rhinorrhea, sneezing and sore throat.   Respiratory: Negative for cough, chest tightness, shortness of breath and wheezing.   Cardiovascular: Negative for chest pain and palpitations.       Blood pressure elevated today.  Gastrointestinal: Positive for abdominal pain. Negative for constipation, diarrhea, nausea and vomiting.  Endocrine: Negative for cold intolerance, heat intolerance, polydipsia and polyuria.  Musculoskeletal: Positive for back pain. Negative for arthralgias, joint swelling and neck pain.  Skin: Negative for rash.  Neurological: Positive for headaches. Negative for dizziness, tremors and numbness.  Hematological: Negative for adenopathy. Does not bruise/bleed easily.  Psychiatric/Behavioral: Positive for dysphoric mood. Negative for behavioral problems (Depression), sleep disturbance and suicidal ideas. The patient is nervous/anxious.     Today's Vitals   04/23/20 1112  BP: (!) 152/84  Pulse: 72  Resp: 16  Temp: (!) 97.3 F (36.3 C)  SpO2: 95%  Weight: 171 lb 9.6 oz (77.8 kg)  Height: 5\' 3"  (1.6 m)   Body mass index is 30.4 kg/m.  Physical Exam Vitals and nursing note reviewed.  Constitutional:      General: She is not in acute distress.    Appearance: Normal appearance. She is well-developed. She is not diaphoretic.  HENT:      Head: Normocephalic and atraumatic.     Nose: Nose normal.     Mouth/Throat:     Pharynx: No oropharyngeal exudate.  Eyes:     Pupils: Pupils are equal, round, and reactive to light.  Neck:     Thyroid: No thyromegaly.     Vascular: No JVD.     Trachea: No tracheal deviation.  Cardiovascular:     Rate and Rhythm: Normal rate and regular rhythm.     Heart sounds: Normal heart sounds. No murmur heard.  No friction rub. No gallop.   Pulmonary:     Effort: Pulmonary effort is normal. No respiratory distress.     Breath sounds: Normal breath sounds. No wheezing or rales.  Chest:     Chest wall: No tenderness.  Abdominal:     General: Bowel sounds are normal.     Palpations: Abdomen is soft.     Tenderness: There is abdominal tenderness.  Musculoskeletal:        General: Normal range of motion.     Cervical back: Normal range of motion and neck supple.     Comments: Tenderness of right mid back region. No palpable abnormalities or deformities noted.   Lymphadenopathy:     Cervical: No cervical adenopathy.  Skin:    General: Skin is warm and dry.  Neurological:     Mental Status: She is alert and oriented to person, place, and time. Mental status is at baseline.     Cranial Nerves: No cranial nerve deficit.  Psychiatric:        Mood and Affect: Mood normal.        Behavior: Behavior normal.        Thought Content: Thought content normal.        Judgment: Judgment normal.    Assessment/Plan: 1. Type 2 diabetes mellitus with stage 3a chronic kidney disease, without long-term current use of insulin (HCC) - POCT HgB A1C 6.3 today. Continue diabetic medication as prescribed. Monitor closely.  2. Essential hypertension, benign Restart propranolol at 10mg  twice daily. Advised patient to keep close eye on blood pressure and notify office if she has anu prlems or concerns.  - propranolol (INDERAL) 10 MG tablet; Take 1 tablet (10 mg total) by mouth 2 (two) times daily.  Dispense: 60  tablet; Refill: 2  3. Hepatic cyst Reviewed abdominal ultrasound with the patient. Ultrasound of the abdomen 04/02/2020. Has simple cyst on left lobe of the liver measuring 2.6X2.4 cm in diameter. Has complicated cyst on right lobe of the lever measuring 9.5 X 9.7 cm in diameter. There is tenderness when this area of the abdomen and back are palpated.  Refer to GI/hepatology for further evaluation.  - Ambulatory referral to Gastroenterology  General Counseling: analysia dungee understanding of the findings of todays visit and agrees with plan of treatment. I have discussed any further diagnostic evaluation that may be needed or ordered today. We also reviewed her medications today. she has been encouraged to call the office with any questions or concerns that should arise related to todays visit.  This patient was seen by Leretha Pol FNP Collaboration with Dr Lavera Guise as a part of collaborative care agreement  Orders Placed This Encounter  Procedures  . Ambulatory referral to Gastroenterology  . POCT HgB A1C    Meds ordered this encounter  Medications  . propranolol (INDERAL) 10 MG tablet    Sig: Take 1 tablet (10 mg total) by mouth 2 (two) times daily.    Dispense:  60 tablet    Refill:  2    Patient will use propranolol 20mg  tablets she already has, taking 1/2 tablet twice daily for now. Will new new prescription in the future.    Order Specific Question:   Supervising Provider    Answer:   Lavera Guise [[id number]]    Total time spent: 82 Minutes Time spent includes review of chart, medications, test results, and follow up plan with the patient.      Dr Lavera Guise Internal medicine

## 2020-04-28 ENCOUNTER — Other Ambulatory Visit: Payer: Self-pay

## 2020-04-28 DIAGNOSIS — G4720 Circadian rhythm sleep disorder, unspecified type: Secondary | ICD-10-CM

## 2020-04-28 MED ORDER — MIRTAZAPINE 30 MG PO TABS: ORAL_TABLET | ORAL | 1 refills | Status: DC

## 2020-06-05 ENCOUNTER — Ambulatory Visit: Payer: Medicare Other | Admitting: Hospice and Palliative Medicine

## 2020-06-12 DIAGNOSIS — H401132 Primary open-angle glaucoma, bilateral, moderate stage: Secondary | ICD-10-CM | POA: Diagnosis not present

## 2020-06-16 ENCOUNTER — Other Ambulatory Visit: Payer: Self-pay

## 2020-06-16 ENCOUNTER — Encounter: Payer: Self-pay | Admitting: Gastroenterology

## 2020-06-16 ENCOUNTER — Ambulatory Visit (INDEPENDENT_AMBULATORY_CARE_PROVIDER_SITE_OTHER): Payer: Medicare Other | Admitting: Gastroenterology

## 2020-06-16 VITALS — BP 146/66 | HR 80 | Temp 97.8°F | Ht 63.0 in | Wt 167.0 lb

## 2020-06-16 DIAGNOSIS — K7689 Other specified diseases of liver: Secondary | ICD-10-CM

## 2020-06-16 DIAGNOSIS — K769 Liver disease, unspecified: Secondary | ICD-10-CM

## 2020-06-16 NOTE — Progress Notes (Signed)
Jonathon Bellows MD, MRCP(U.K) [address]  [place]  [place], [place] [address]  Main: [phone]  Fax: [phone]   Primary Care Physician: Lavera Guise, MD  Primary Gastroenterologist:  Dr. Jonathon Bellows   Referred for evaluation of a liver cyst   HPI: Stacey Boyd is a 75 y.o. female who was previously seen by me in 2019 for osmotic diarrhea due to consumption of high quantity of fructose corn syrup in her diet.  Resolved with cessation of certain food items.  She is here today as she had an ultrasound of the abdomen in November 2021.That demonstrated a simple cyst in the left lobe of the liver and in the right lobe there is a complicated septated cyst measuring 9.5 x 9.7 cm.  There was tenderness noted when the transducer apply pressure over the area of the cyst.  CT scan of the abdomen in April 2019 demonstrated multiple stable well-circumscribed lesions throughout the liver compatible with cysts.  Largest cyst was 8.1 x 7.5 cm.  She says she has been having gradual worsening of pain on the right side of her abdomen in the midclavicular line.  Tender when she presses in that area.   Current Outpatient Medications  Medication Sig Dispense Refill  . lisinopril-hydrochlorothiazide (ZESTORETIC) 20-12.5 MG tablet Take 2 tablets by mouth daily.    . Acetaminophen (TYLENOL ARTHRITIS PAIN PO) Take by mouth as needed.    Marland Kitchen atorvastatin (LIPITOR) 10 MG tablet Take 1 tablet (10 mg total) by mouth daily. FOR CHOLESTEROL 90 tablet 3  . azelastine (ASTELIN) 0.1 % nasal spray Place 2 sprays into both nostrils 2 (two) times daily. Use in each nostril as directed 30 mL 1  . Cholecalciferol (VITAMIN D3) 5000 units TABS Take 5,000 Units by mouth daily.     . dorzolamide-timolol (COSOPT) 22.3-6.8 MG/ML ophthalmic solution Place 1 drop into both eyes 2 (two) times daily.     . fexofenadine (ALLEGRA) 180 MG tablet Take 180 mg by mouth daily.    Marland Kitchen glimepiride (AMARYL) 1 MG tablet TAKE ONE TAB  WITH LUNCH FOR DM 90 tablet 3  . latanoprost (XALATAN) 0.005 % ophthalmic solution 1 drop nightly.    . meclizine (ANTIVERT) 12.5 MG tablet Take 1 tablet by mouth 2 (two) times daily as needed.    . Melatonin 10 MG TABS Take 20 mg by mouth as needed.     . metFORMIN (GLUCOPHAGE) 500 MG tablet Take 1 tablet (500 mg total) by mouth 2 (two) times daily with a meal. 180 tablet 1  . mirtazapine (REMERON) 30 MG tablet TAKE ONE TAB PO QHS 90 tablet 1  . Multiple Vitamins-Minerals (SENTRY SENIOR) TABS Take by mouth daily. Eye vitamin    . propranolol (INDERAL) 10 MG tablet Take 1 tablet (10 mg total) by mouth 2 (two) times daily. 60 tablet 2  . sertraline (ZOLOFT) 50 MG tablet Take one tab a day . 90 tablet 1  . valACYclovir (VALTREX) 1000 MG tablet Take 1 tablet (1,000 mg total) by mouth as directed. Take 2 by mouth at onset then 2 by mouth 12 hours later. 30 tablet 0   No current facility-administered medications for this visit.    Allergies as of 06/16/2020 - Review Complete 06/16/2020  Allergen Reaction Noted  . Codeine Hives and Nausea And Vomiting 03/06/2013    ROS:  General: Negative for anorexia, weight loss, fever, chills, fatigue, weakness. ENT: Negative for hoarseness, difficulty swallowing , nasal congestion. CV: Negative for  chest pain, angina, palpitations, dyspnea on exertion, peripheral edema.  Respiratory: Negative for dyspnea at rest, dyspnea on exertion, cough, sputum, wheezing.  GI: See history of present illness. GU:  Negative for dysuria, hematuria, urinary incontinence, urinary frequency, nocturnal urination.  Endo: Negative for unusual weight change.    Physical Examination:   BP (!) 146/66   Pulse 80   Temp 97.8 F (36.6 C) (Oral)   Ht 5\' 3"  (1.6 m)   Wt 167 lb (75.8 kg)   BMI 29.58 kg/m   General: Well-nourished, well-developed in no acute distress.  Eyes: No icterus. Conjunctivae pink. Abdomen: Bowel sounds are normal, mild right upper quadrant tenderness  in the midclavicular line.  Nondistended, no hepatosplenomegaly or masses, no abdominal bruits or hernia , no rebound or guarding.   Extremities: No lower extremity edema. No clubbing or deformities. Neuro: Alert and oriented x 3.  Grossly intact. Skin: Warm and dry, no jaundice.   Psych: Alert and cooperative, normal mood and affect.   Imaging Studies: No results found.  Assessment and Plan:   Stacey Boyd is a 75 y.o. y/o female here to be evaluated for cysts seen on the liver being evaluated for abdominal pain.  One of the cyst has been described as a simple cyst but the other one has been described being over 8 cm in size with septations.  Prior imaging over 2 years back showed a similar size cyst.  Since she is symptomatic with abdominal pain I would suggest that we obtain further evaluation of the cyst with an MRI to evaluate further based on the results we could discuss whether it  requires further intervention such as referral to a hepatobiliary surgeon for resection of the cyst as she is symptomatic    Dr Jonathon Bellows  MD,MRCP [facility]) Follow up in 6 to 8 weeks

## 2020-06-20 ENCOUNTER — Ambulatory Visit (INDEPENDENT_AMBULATORY_CARE_PROVIDER_SITE_OTHER): Payer: Medicare Other | Admitting: Physician Assistant

## 2020-06-20 ENCOUNTER — Encounter: Payer: Self-pay | Admitting: Physician Assistant

## 2020-06-20 VITALS — BP 148/80 | HR 78 | Temp 97.6°F | Resp 16 | Ht 63.0 in | Wt 167.2 lb

## 2020-06-20 DIAGNOSIS — N1831 Chronic kidney disease, stage 3a: Secondary | ICD-10-CM

## 2020-06-20 DIAGNOSIS — R300 Dysuria: Secondary | ICD-10-CM | POA: Diagnosis not present

## 2020-06-20 DIAGNOSIS — G4720 Circadian rhythm sleep disorder, unspecified type: Secondary | ICD-10-CM | POA: Diagnosis not present

## 2020-06-20 DIAGNOSIS — K7689 Other specified diseases of liver: Secondary | ICD-10-CM

## 2020-06-20 DIAGNOSIS — I10 Essential (primary) hypertension: Secondary | ICD-10-CM

## 2020-06-20 DIAGNOSIS — Z0001 Encounter for general adult medical examination with abnormal findings: Secondary | ICD-10-CM

## 2020-06-20 DIAGNOSIS — E1122 Type 2 diabetes mellitus with diabetic chronic kidney disease: Secondary | ICD-10-CM | POA: Diagnosis not present

## 2020-06-20 DIAGNOSIS — F323 Major depressive disorder, single episode, severe with psychotic features: Secondary | ICD-10-CM

## 2020-06-20 MED ORDER — TRAZODONE HCL 50 MG PO TABS: 25.0000 mg | ORAL_TABLET | Freq: Every evening | ORAL | 3 refills | Status: DC | PRN

## 2020-06-20 MED ORDER — SERTRALINE HCL 100 MG PO TABS: 100.0000 mg | ORAL_TABLET | Freq: Every day | ORAL | 3 refills | Status: DC

## 2020-06-20 MED ORDER — MIRTAZAPINE 45 MG PO TBDP: 45.0000 mg | ORAL_TABLET | Freq: Every day | ORAL | 2 refills | Status: DC

## 2020-06-20 NOTE — Progress Notes (Signed)
[facility] [place], [place] [address]  Internal MEDICINE  Office Visit Note  Patient Name: Stacey Boyd  [id number]  [id number]  Date of Service: 06/20/2020  Chief Complaint  Patient presents with  . Medicare Wellness    Discuss meds and anxiety, doesn't stay asleep through the night, discuss cyst on liver, wants to see therapist or psychiatrist, review med list  . Depression  . Diabetes  . Hyperlipidemia  . Hypertension  . Anxiety     HPI Pt is here for routine health maintenance examination BG not checked at home, but she is taking Amaryl as prescribed. She does state she went to [facility] yesterday due to an anxiety attack. She was advised to switch her medications and get a referral for counseling. She is going through a lot right now and is still waiting for a senior apartment and is living out of a suitcase with family members, who differ in routine and personality. She has poor sleep due to her anxiety. She can fall asleep, but will wake up in the middle of the night thinking she hears voices and therefore does not get good quality or quantity of sleep. She also has some RUQ pain from a hepatic cyst that is being followed by GI. She follows up with them next week.  Current Medication: Outpatient Encounter Medications as of 06/20/2020  Medication Sig  . gabapentin (NEURONTIN) 100 MG capsule Take 100 mg by mouth 3 (three) times daily.  . mirtazapine (REMERON SOL-TAB) 45 MG disintegrating tablet Take 1 tablet (45 mg total) by mouth at bedtime.  Marland Kitchen rOPINIRole (REQUIP) 2 MG tablet Take 2 mg by mouth at bedtime.  . sertraline (ZOLOFT) 100 MG tablet Take 1 tablet (100 mg total) by mouth daily.  . traZODone (DESYREL) 50 MG tablet Take 0.5-1 tablets (25-50 mg total) by mouth at bedtime as needed for sleep.  . Acetaminophen (TYLENOL ARTHRITIS PAIN PO) Take by mouth as needed.  Marland Kitchen atorvastatin (LIPITOR) 10 MG tablet Take 1 tablet (10 mg total) by mouth  daily. FOR CHOLESTEROL  . azelastine (ASTELIN) 0.1 % nasal spray Place 2 sprays into both nostrils 2 (two) times daily. Use in each nostril as directed  . Cholecalciferol (VITAMIN D3) 5000 units TABS Take 5,000 Units by mouth daily.   . dorzolamide-timolol (COSOPT) 22.3-6.8 MG/ML ophthalmic solution Place 1 drop into both eyes 2 (two) times daily.   . fexofenadine (ALLEGRA) 180 MG tablet Take 180 mg by mouth daily.  Marland Kitchen glimepiride (AMARYL) 1 MG tablet TAKE ONE TAB WITH LUNCH FOR DM  . latanoprost (XALATAN) 0.005 % ophthalmic solution 1 drop nightly.  Marland Kitchen lisinopril-hydrochlorothiazide (ZESTORETIC) 20-12.5 MG tablet Take 2 tablets by mouth daily.  . meclizine (ANTIVERT) 12.5 MG tablet Take 1 tablet by mouth 2 (two) times daily as needed.  . Melatonin 10 MG TABS Take 20 mg by mouth as needed.   . Multiple Vitamins-Minerals (SENTRY SENIOR) TABS Take by mouth daily. Eye vitamin  . propranolol (INDERAL) 10 MG tablet Take 1 tablet (10 mg total) by mouth 2 (two) times daily.  . valACYclovir (VALTREX) 1000 MG tablet Take 1 tablet (1,000 mg total) by mouth as directed. Take 2 by mouth at onset then 2 by mouth 12 hours later.  . [DISCONTINUED] metFORMIN (GLUCOPHAGE) 500 MG tablet Take 1 tablet (500 mg total) by mouth 2 (two) times daily with a meal.  . [DISCONTINUED] mirtazapine (REMERON) 30 MG tablet TAKE ONE TAB PO QHS  . [DISCONTINUED] sertraline (  ZOLOFT) 50 MG tablet Take one tab a day .   No facility-administered encounter medications on file as of 06/20/2020.    Surgical History: Past Surgical History:  Procedure Laterality Date  . ABDOMINAL HYSTERECTOMY  2005  . APPENDECTOMY  1980  . CARDIAC CATHETERIZATION Left 09/17/2015   Procedure: Left Heart Cath and Coronary Angiography;  Surgeon: Teodoro Spray, MD;  Location: [facility];  Service: Cardiovascular;  Laterality: Left;  . CARPAL TUNNEL RELEASE    . CATARACT EXTRACTION W/PHACO Left 09/25/2019   Procedure: CATARACT EXTRACTION PHACO AND  INTRAOCULAR LENS PLACEMENT (IOC) LEFT   4.45  00:36.1;  Surgeon: Birder Robson, MD;  Location: [facility];  Service: Ophthalmology;  Laterality: Left;  . CATARACT EXTRACTION W/PHACO Right 10/16/2019   Procedure: CATARACT EXTRACTION PHACO AND INTRAOCULAR LENS PLACEMENT (IOC)right  DIABETIC;  Surgeon: Birder Robson, MD;  Location: [facility];  Service: Ophthalmology;  Laterality: Right;  5.29 0:33.7  . COLONOSCOPY WITH PROPOFOL N/A 11/07/2017   Procedure: COLONOSCOPY WITH PROPOFOL;  Surgeon: Jonathon Bellows, MD;  Location: [facility];  Service: Gastroenterology;  Laterality: N/A;  . FOOT SURGERY Left    2 nodules removed  . FRACTURE SURGERY Right    wrist  . WRIST FRACTURE SURGERY Right     Medical History: Past Medical History:  Diagnosis Date  . Allergy   . Anxiety   . Arthritis    legs  . Cancer ([place])    skin cancer on right shoulder.   . Depression   . Diabetes mellitus without complication ([place])   . Diverticulitis   . Dyspnea    with exertion  . Glaucoma   . Hyperlipidemia   . Hypertension    controlled on meds  . Hypothyroidism   . Liver cyst   . Osteoporosis   . Shoulder pain, left    and arm  . Thyroid disease   . Wrist fracture 2001   right    Family History: Family History  Problem Relation Age of Onset  . Heart disease Mother        h/o rheumatic fever  . Heart disease Father   . Stroke Father   . Diabetes Sister   . Diabetes Brother   . Diabetes Sister   . Mental illness Other   . Colon cancer Neg Hx   . Breast cancer Neg Hx       Review of Systems  Constitutional: Negative for chills, fatigue and unexpected weight change.  HENT: Negative for congestion, postnasal drip, rhinorrhea, sneezing and sore throat.   Eyes: Negative for redness.  Respiratory: Negative for cough, chest tightness and shortness of breath.   Cardiovascular: Negative for chest pain and palpitations.  Gastrointestinal: Negative for abdominal pain,  constipation, diarrhea, nausea and vomiting.  Genitourinary: Negative for dysuria and frequency.  Musculoskeletal: Negative for arthralgias, back pain, joint swelling and neck pain.  Skin: Negative for rash.  Neurological: Negative.  Negative for tremors and numbness.  Hematological: Negative for adenopathy. Does not bruise/bleed easily.  Psychiatric/Behavioral: Positive for behavioral problems (Depression) and sleep disturbance. Negative for suicidal ideas. The patient is nervous/anxious.        Does admit to thinking she hears voices that wake her up in the middle of the night, but she knows no one is there and does not see anyone.      Vital Signs: BP (!) 148/80   Pulse 78   Temp 97.6 F (36.4 C)   Resp 16  Ht 5\' 3"  (1.6 m)   Wt 167 lb 3.2 oz (75.8 kg)   SpO2 97%   BMI 29.62 kg/m    Physical Exam Constitutional:      General: She is not in acute distress.    Appearance: She is well-developed. She is not diaphoretic.  HENT:     Head: Normocephalic and atraumatic.     Right Ear: External ear normal.     Left Ear: External ear normal.     Nose: Nose normal.     Mouth/Throat:     Pharynx: No oropharyngeal exudate.  Eyes:     General: No scleral icterus.       Right eye: No discharge.        Left eye: No discharge.     Conjunctiva/sclera: Conjunctivae normal.     Pupils: Pupils are equal, round, and reactive to light.  Neck:     Thyroid: No thyromegaly.     Vascular: No JVD.     Trachea: No tracheal deviation.  Cardiovascular:     Rate and Rhythm: Normal rate and regular rhythm.     Pulses:          Dorsalis pedis pulses are 3+ on the right side and 3+ on the left side.       Posterior tibial pulses are 3+ on the right side and 3+ on the left side.     Heart sounds: Normal heart sounds. No murmur heard. No friction rub. No gallop.   Pulmonary:     Effort: Pulmonary effort is normal. No respiratory distress.     Breath sounds: Normal breath sounds. No stridor. No  wheezing or rales.  Chest:     Chest wall: No tenderness.  Breasts:     Right: Normal. No swelling or mass.     Left: Normal. No swelling or mass.    Abdominal:     General: Bowel sounds are normal. There is no distension.     Palpations: Abdomen is soft. There is no mass.     Tenderness: There is abdominal tenderness. There is no guarding or rebound.     Comments: RUQ TTP  Musculoskeletal:        General: No tenderness or deformity. Normal range of motion.     Cervical back: Normal range of motion and neck supple.     Right foot: Normal range of motion.     Left foot: Normal range of motion.  Feet:     Right foot:     Protective Sensation: 2 sites tested. 2 sites sensed.     Skin integrity: Skin integrity normal.     Toenail Condition: Right toenails are normal.     Left foot:     Protective Sensation: 2 sites tested. 2 sites sensed.     Skin integrity: Skin integrity normal.     Toenail Condition: Left toenails are normal.  Lymphadenopathy:     Cervical: No cervical adenopathy.  Skin:    General: Skin is warm and dry.     Coloration: Skin is not pale.     Findings: No erythema or rash.  Neurological:     Mental Status: She is alert.     Cranial Nerves: No cranial nerve deficit.     Motor: No abnormal muscle tone.     Coordination: Coordination normal.     Deep Tendon Reflexes: Reflexes are normal and symmetric.  Psychiatric:        Thought Content: Thought content normal.  Judgment: Judgment normal.     Comments: Pt appears anxious and depressed      LABS: Recent Results (from the past 2160 hour(s))  POCT HgB A1C     Status: Abnormal   Collection Time: 04/23/20 11:25 AM  Result Value Ref Range   Hemoglobin A1C 6.3 (A) 4.0 - 5.6 %   HbA1c POC (<> result, manual entry)     HbA1c, POC (prediabetic range)     HbA1c, POC (controlled diabetic range)          Assessment/Plan: 1. Encounter for general adult medical examination with abnormal findings Up  to date on colonoscopy. Had labs done in October and previously reviewed. Will need to repeat in 3 months to monitor Cr now that she has been off the metformin.  2. Type 2 diabetes mellitus with stage 3a chronic kidney disease, without long-term current use of insulin (HCC) Continue Amaryl as prescribed. A1c in December was 6.3. Will continue to monitor and recheck A1c next visit.  3. Essential hypertension, benign BP elevated today due to being anxious and stressed with current living situation. Will continue Propanolol 10mg  BID and monitor next visit.  4. Major depression with psychotic features ([place]) Pt is experiencing anxiety and depression. Will increase Zoloft and add on Trazodone to help with sleep. She will be referred to psychiatry. - traZODone (DESYREL) 50 MG tablet; Take 0.5-1 tablets (25-50 mg total) by mouth at bedtime as needed for sleep.  Dispense: 30 tablet; Refill: 3 - mirtazapine (REMERON SOL-TAB) 45 MG disintegrating tablet; Take 1 tablet (45 mg total) by mouth at bedtime.  Dispense: 30 tablet; Refill: 2 - sertraline (ZOLOFT) 100 MG tablet; Take 1 tablet (100 mg total) by mouth daily.  Dispense: 30 tablet; Refill: 3 - Ambulatory referral to Psychiatry  5. Circadian rhythm sleep disturbance Pt is still experiencing poor sleep. Will add on trazodone and increase mirtazapine. - traZODone (DESYREL) 50 MG tablet; Take 0.5-1 tablets (25-50 mg total) by mouth at bedtime as needed for sleep.  Dispense: 30 tablet; Refill: 3 - mirtazapine (REMERON SOL-TAB) 45 MG disintegrating tablet; Take 1 tablet (45 mg total) by mouth at bedtime.  Dispense: 30 tablet; Refill: 2  6. Hepatic cyst Followed by Gertie Fey.  7. Dysuria - UA/M w/rflx Culture, Routine  General Counseling: Fiora verbalizes understanding of the findings of todays visit and agrees with plan of treatment. I have discussed any further diagnostic evaluation that may be needed or ordered today. We also reviewed her medications  today. she has been encouraged to call the office with any questions or concerns that should arise related to todays visit.  Counseling: Cardiac risk factor modification:  1. Control blood pressure. 2. Exercise as prescribed. 3. Follow low sodium, low fat diet. and low fat and low cholestrol diet. 4. Take ASA 81mg  once a day. 5. Restricted calories diet to lose weight.   Orders Placed This Encounter  Procedures  . UA/M w/rflx Culture, Routine  . Ambulatory referral to Psychiatry    Meds ordered this encounter  Medications  . traZODone (DESYREL) 50 MG tablet    Sig: Take 0.5-1 tablets (25-50 mg total) by mouth at bedtime as needed for sleep.    Dispense:  30 tablet    Refill:  3  . mirtazapine (REMERON SOL-TAB) 45 MG disintegrating tablet    Sig: Take 1 tablet (45 mg total) by mouth at bedtime.    Dispense:  30 tablet    Refill:  2  . sertraline (ZOLOFT) 100  MG tablet    Sig: Take 1 tablet (100 mg total) by mouth daily.    Dispense:  30 tablet    Refill:  3    Total time spent:45 Minutes  Time spent includes review of chart, medications, test results, and follow up plan with the patient.     Lavera Guise, MD  Internal Medicine

## 2020-06-24 LAB — UA/M W/RFLX CULTURE, ROUTINE
Bilirubin, UA: NEGATIVE
Glucose, UA: NEGATIVE
Nitrite, UA: POSITIVE — AB
RBC, UA: NEGATIVE
Specific Gravity, UA: >=1.03 — AB (ref 1.005–1.030)
Urobilinogen, Ur: 0.2 mg/dL (ref 0.2–1.0)
pH, UA: 6 (ref 5.0–7.5)

## 2020-06-24 LAB — URINE CULTURE, REFLEX

## 2020-06-24 LAB — MICROSCOPIC EXAMINATION: Casts: NONE SEEN /lpf

## 2020-06-26 ENCOUNTER — Other Ambulatory Visit: Payer: Self-pay

## 2020-06-26 ENCOUNTER — Ambulatory Visit
Admission: RE | Admit: 2020-06-26 | Discharge: 2020-06-26 | Disposition: A | Discharge status: Home / Self Care | Payer: Medicare Other | Source: Ambulatory Visit | Attending: Gastroenterology | Admitting: Gastroenterology

## 2020-06-26 DIAGNOSIS — R112 Nausea with vomiting, unspecified: Secondary | ICD-10-CM | POA: Diagnosis not present

## 2020-06-26 DIAGNOSIS — K769 Liver disease, unspecified: Secondary | ICD-10-CM | POA: Diagnosis not present

## 2020-06-26 MED ORDER — GADOBUTROL 1 MMOL/ML IV SOLN
7.5000 mL | Freq: Once | INTRAVENOUS | Status: AC | PRN
  Administered 2020-06-26: 7.5 mL via INTRAVENOUS

## 2020-07-01 ENCOUNTER — Encounter: Payer: Self-pay | Admitting: Gastroenterology

## 2020-07-01 ENCOUNTER — Telehealth: Payer: Self-pay

## 2020-07-01 NOTE — Telephone Encounter (Signed)
Called patient to inform her of the MRI results. LVM to call office back.

## 2020-07-01 NOTE — Telephone Encounter (Signed)
-----   Message from Jonathon Bellows, MD sent at 07/01/2020 11:06 AM EST ----- Jovon Inform that the MRI shows that the liver cyst is stable and benign appearing. Since she is symptomatic due to the size , it would warrant referral to [facility] liver surgeon to determine if she should have the cyst excised to help with her symptoms. Please refer.  C/c Lavera Guise, MD   Dr Jonathon Bellows MD,MRCP [facility]) Gastroenterology/Hepatology Pager: [phone]

## 2020-07-03 ENCOUNTER — Other Ambulatory Visit: Payer: Self-pay | Admitting: Physician Assistant

## 2020-07-03 ENCOUNTER — Telehealth: Payer: Self-pay

## 2020-07-03 DIAGNOSIS — R300 Dysuria: Secondary | ICD-10-CM

## 2020-07-03 MED ORDER — NITROFURANTOIN MONOHYD MACRO 100 MG PO CAPS: ORAL_CAPSULE | ORAL | 0 refills | Status: DC

## 2020-07-03 NOTE — Telephone Encounter (Signed)
Informed pt that urine showed some bacteria and that antibiotics were sent in for her. Also informed pt to go to [facility] to do urine culture.

## 2020-07-03 NOTE — Telephone Encounter (Signed)
-----   Message from Mylinda Latina, PA-C sent at 07/03/2020  8:45 AM EST ----- Can you please let pt know that her urine showed bacteria, so I will call in ABX, but we need her to go to [facility] for new urine culture.

## 2020-07-03 NOTE — Progress Notes (Signed)
Can you please let pt know that her urine showed bacteria, so I will call in ABX, but we need her to go to [facility] for new urine culture.

## 2020-07-04 ENCOUNTER — Telehealth: Payer: Self-pay

## 2020-07-04 DIAGNOSIS — R300 Dysuria: Secondary | ICD-10-CM | POA: Diagnosis not present

## 2020-07-04 NOTE — Telephone Encounter (Signed)
Called patient to inform her of results. LVM to call office back.

## 2020-07-07 ENCOUNTER — Telehealth: Payer: Self-pay

## 2020-07-07 ENCOUNTER — Other Ambulatory Visit: Payer: Self-pay

## 2020-07-07 NOTE — Telephone Encounter (Signed)
Patient has been informed of results from the MRI. Informed pt of Dr. Georgeann Oppenheim recommendations. Pt verbalized understanding.

## 2020-07-07 NOTE — Progress Notes (Signed)
Error

## 2020-07-08 LAB — CULTURE, URINE COMPREHENSIVE

## 2020-07-10 ENCOUNTER — Other Ambulatory Visit: Payer: Self-pay

## 2020-07-10 ENCOUNTER — Emergency Department: Payer: Medicare Other

## 2020-07-10 ENCOUNTER — Emergency Department (EMERGENCY_DEPARTMENT_HOSPITAL)
Admission: EM | Admit: 2020-07-10 | Discharge: 2020-07-11 | Disposition: A | Discharge status: Home / Self Care | Payer: Medicare Other | Source: Home / Self Care | Attending: Emergency Medicine | Admitting: Emergency Medicine

## 2020-07-10 ENCOUNTER — Encounter: Payer: Self-pay | Admitting: Emergency Medicine

## 2020-07-10 DIAGNOSIS — I129 Hypertensive chronic kidney disease with stage 1 through stage 4 chronic kidney disease, or unspecified chronic kidney disease: Secondary | ICD-10-CM | POA: Insufficient documentation

## 2020-07-10 DIAGNOSIS — F29 Unspecified psychosis not due to a substance or known physiological condition: Secondary | ICD-10-CM | POA: Insufficient documentation

## 2020-07-10 DIAGNOSIS — Z20822 Contact with and (suspected) exposure to covid-19: Secondary | ICD-10-CM | POA: Insufficient documentation

## 2020-07-10 DIAGNOSIS — E039 Hypothyroidism, unspecified: Secondary | ICD-10-CM | POA: Insufficient documentation

## 2020-07-10 DIAGNOSIS — Y9302 Activity, running: Secondary | ICD-10-CM | POA: Insufficient documentation

## 2020-07-10 DIAGNOSIS — Z7984 Long term (current) use of oral hypoglycemic drugs: Secondary | ICD-10-CM | POA: Insufficient documentation

## 2020-07-10 DIAGNOSIS — N183 Chronic kidney disease, stage 3 unspecified: Secondary | ICD-10-CM | POA: Insufficient documentation

## 2020-07-10 DIAGNOSIS — R440 Auditory hallucinations: Secondary | ICD-10-CM | POA: Diagnosis present

## 2020-07-10 DIAGNOSIS — W01198A Fall on same level from slipping, tripping and stumbling with subsequent striking against other object, initial encounter: Secondary | ICD-10-CM | POA: Insufficient documentation

## 2020-07-10 DIAGNOSIS — E1122 Type 2 diabetes mellitus with diabetic chronic kidney disease: Secondary | ICD-10-CM | POA: Insufficient documentation

## 2020-07-10 DIAGNOSIS — Z85828 Personal history of other malignant neoplasm of skin: Secondary | ICD-10-CM | POA: Insufficient documentation

## 2020-07-10 DIAGNOSIS — Z79899 Other long term (current) drug therapy: Secondary | ICD-10-CM | POA: Insufficient documentation

## 2020-07-10 DIAGNOSIS — M25511 Pain in right shoulder: Secondary | ICD-10-CM | POA: Insufficient documentation

## 2020-07-10 DIAGNOSIS — M1990 Unspecified osteoarthritis, unspecified site: Secondary | ICD-10-CM | POA: Diagnosis present

## 2020-07-10 DIAGNOSIS — F69 Unspecified disorder of adult personality and behavior: Secondary | ICD-10-CM

## 2020-07-10 LAB — SALICYLATE LEVEL: Salicylate Lvl: <7 mg/dL — ABNORMAL LOW (ref 7.0–30.0)

## 2020-07-10 LAB — CBC WITH DIFFERENTIAL/PLATELET
Abs Immature Granulocytes: 0.01 10*3/uL (ref 0.00–0.07)
Basophils Absolute: 0.1 10*3/uL (ref 0.0–0.1)
Basophils Relative: 1 %
Eosinophils Absolute: 0.1 10*3/uL (ref 0.0–0.5)
Eosinophils Relative: 2 %
HCT: 39.3 % (ref 36.0–46.0)
Hemoglobin: 13.1 g/dL (ref 12.0–15.0)
Immature Granulocytes: 0 %
Lymphocytes Relative: 35 %
Lymphs Abs: 2.6 10*3/uL (ref 0.7–4.0)
MCH: 29.5 pg (ref 26.0–34.0)
MCHC: 33.3 g/dL (ref 30.0–36.0)
MCV: 88.5 fL (ref 80.0–100.0)
Monocytes Absolute: 0.5 10*3/uL (ref 0.1–1.0)
Monocytes Relative: 7 %
Neutro Abs: 4.1 10*3/uL (ref 1.7–7.7)
Neutrophils Relative %: 55 %
Platelets: 291 10*3/uL (ref 150–400)
RBC: 4.44 MIL/uL (ref 3.87–5.11)
RDW: 12.7 % (ref 11.5–15.5)
WBC: 7.4 10*3/uL (ref 4.0–10.5)
nRBC: 0 % (ref 0.0–0.2)

## 2020-07-10 LAB — COMPREHENSIVE METABOLIC PANEL
ALT: 19 U/L (ref 0–44)
AST: 19 U/L (ref 15–41)
Albumin: 4.1 g/dL (ref 3.5–5.0)
Alkaline Phosphatase: 86 U/L (ref 38–126)
Anion gap: 9 (ref 5–15)
BUN: 20 mg/dL (ref 8–23)
CO2: 24 mmol/L (ref 22–32)
Calcium: 9.1 mg/dL (ref 8.9–10.3)
Chloride: 104 mmol/L (ref 98–111)
Creatinine, Ser: 0.96 mg/dL (ref 0.44–1.00)
GFR, Estimated: >60 mL/min (ref >60)
Glucose, Bld: 101 mg/dL — ABNORMAL HIGH (ref 70–99)
Potassium: 3.8 mmol/L (ref 3.5–5.1)
Sodium: 137 mmol/L (ref 135–145)
Total Bilirubin: 1 mg/dL (ref 0.3–1.2)
Total Protein: 7.1 g/dL (ref 6.5–8.1)

## 2020-07-10 LAB — URINALYSIS, COMPLETE (UACMP) WITH MICROSCOPIC
Bacteria, UA: NONE SEEN
Bilirubin Urine: NEGATIVE
Glucose, UA: NEGATIVE mg/dL
Hgb urine dipstick: NEGATIVE
Ketones, ur: NEGATIVE mg/dL
Leukocytes,Ua: NEGATIVE
Nitrite: NEGATIVE
Protein, ur: NEGATIVE mg/dL
Specific Gravity, Urine: 1.01 (ref 1.005–1.030)
pH: 6 (ref 5.0–8.0)

## 2020-07-10 LAB — URINE DRUG SCREEN, QUALITATIVE (ARMC ONLY)
Amphetamines, Ur Screen: NOT DETECTED
Barbiturates, Ur Screen: NOT DETECTED
Benzodiazepine, Ur Scrn: NOT DETECTED
Cannabinoid 50 Ng, Ur ~~LOC~~: NOT DETECTED
Cocaine Metabolite,Ur ~~LOC~~: NOT DETECTED
MDMA (Ecstasy)Ur Screen: NOT DETECTED
Methadone Scn, Ur: NOT DETECTED
Opiate, Ur Screen: NOT DETECTED
Phencyclidine (PCP) Ur S: NOT DETECTED
Tricyclic, Ur Screen: NOT DETECTED

## 2020-07-10 LAB — ACETAMINOPHEN LEVEL: Acetaminophen (Tylenol), Serum: 18 ug/mL (ref 10–30)

## 2020-07-10 LAB — TSH: TSH: 2.535 u[IU]/mL (ref 0.350–4.500)

## 2020-07-10 MED ORDER — ACETAMINOPHEN 325 MG PO TABS
650.0000 mg | ORAL_TABLET | Freq: Four times a day (QID) | ORAL | Status: DC | PRN
  Administered 2020-07-10 – 2020-07-11 (×2): 650 mg via ORAL
  Filled 2020-07-10 (×2): qty 2

## 2020-07-10 MED ORDER — LATANOPROST 0.005 % OP SOLN
1.0000 [drp] | Freq: Every day | OPHTHALMIC | Status: DC
  Administered 2020-07-10: 1 [drp] via OPHTHALMIC
  Filled 2020-07-10: qty 2.5

## 2020-07-10 MED ORDER — TRAZODONE HCL 50 MG PO TABS: 25.0000 mg | ORAL_TABLET | Freq: Every evening | ORAL | Status: DC | PRN

## 2020-07-10 MED ORDER — LISINOPRIL-HYDROCHLOROTHIAZIDE 20-12.5 MG PO TABS: 2.0000 | ORAL_TABLET | Freq: Every day | ORAL | Status: DC

## 2020-07-10 MED ORDER — AZELASTINE HCL 0.1 % NA SOLN
2.0000 | Freq: Two times a day (BID) | NASAL | Status: DC
  Filled 2020-07-10: qty 30

## 2020-07-10 MED ORDER — LISINOPRIL 10 MG PO TABS
40.0000 mg | ORAL_TABLET | Freq: Every day | ORAL | Status: DC
  Administered 2020-07-11: 40 mg via ORAL
  Filled 2020-07-10: qty 4

## 2020-07-10 MED ORDER — DORZOLAMIDE HCL-TIMOLOL MAL 2-0.5 % OP SOLN
1.0000 [drp] | Freq: Two times a day (BID) | OPHTHALMIC | Status: DC
  Administered 2020-07-10 – 2020-07-11 (×2): 1 [drp] via OPHTHALMIC
  Filled 2020-07-10: qty 10

## 2020-07-10 MED ORDER — LIDOCAINE 5 % EX PTCH
1.0000 | MEDICATED_PATCH | CUTANEOUS | Status: DC
  Administered 2020-07-10 – 2020-07-11 (×2): 1 via TRANSDERMAL
  Filled 2020-07-10 (×2): qty 1

## 2020-07-10 MED ORDER — MIRTAZAPINE 15 MG PO TBDP
45.0000 mg | ORAL_TABLET | Freq: Every day | ORAL | Status: DC
  Administered 2020-07-10: 45 mg via ORAL
  Filled 2020-07-10 (×2): qty 3

## 2020-07-10 MED ORDER — ATORVASTATIN CALCIUM 20 MG PO TABS
10.0000 mg | ORAL_TABLET | Freq: Every day | ORAL | Status: DC
  Administered 2020-07-10 – 2020-07-11 (×2): 10 mg via ORAL
  Filled 2020-07-10 (×2): qty 1

## 2020-07-10 MED ORDER — GABAPENTIN 100 MG PO CAPS
100.0000 mg | ORAL_CAPSULE | Freq: Three times a day (TID) | ORAL | Status: DC
  Administered 2020-07-10 – 2020-07-11 (×3): 100 mg via ORAL
  Filled 2020-07-10 (×3): qty 1

## 2020-07-10 MED ORDER — GLIMEPIRIDE 1 MG PO TABS
1.0000 mg | ORAL_TABLET | Freq: Every day | ORAL | Status: DC
  Administered 2020-07-11: 1 mg via ORAL
  Filled 2020-07-10 (×2): qty 1

## 2020-07-10 MED ORDER — GABAPENTIN 100 MG PO CAPS: 100.0000 mg | ORAL_CAPSULE | Freq: Three times a day (TID) | ORAL | Status: DC

## 2020-07-10 MED ORDER — RISPERIDONE 0.5 MG PO TBDP
1.0000 mg | ORAL_TABLET | Freq: Every day | ORAL | Status: DC
  Administered 2020-07-10: 1 mg via ORAL
  Filled 2020-07-10: qty 2

## 2020-07-10 MED ORDER — HYDROCHLOROTHIAZIDE 25 MG PO TABS: 25.0000 mg | ORAL_TABLET | Freq: Every day | ORAL | Status: DC

## 2020-07-10 MED ORDER — SERTRALINE HCL 50 MG PO TABS
100.0000 mg | ORAL_TABLET | Freq: Every day | ORAL | Status: DC
  Administered 2020-07-10 – 2020-07-11 (×2): 100 mg via ORAL
  Filled 2020-07-10 (×2): qty 2

## 2020-07-10 MED ORDER — ROPINIROLE HCL 1 MG PO TABS
2.0000 mg | ORAL_TABLET | Freq: Every day | ORAL | Status: DC
Start: 2020-07-10 — End: 2020-07-11
  Administered 2020-07-10: 2 mg via ORAL
  Filled 2020-07-10: qty 2

## 2020-07-10 MED ORDER — PROPRANOLOL HCL 20 MG PO TABS
10.0000 mg | ORAL_TABLET | Freq: Two times a day (BID) | ORAL | Status: DC
Start: 2020-07-10 — End: 2020-07-11
  Administered 2020-07-10 – 2020-07-11 (×2): 10 mg via ORAL
  Filled 2020-07-10 (×2): qty 1

## 2020-07-10 MED ORDER — ACETAMINOPHEN 500 MG PO TABS
1000.0000 mg | ORAL_TABLET | Freq: Once | ORAL | Status: AC
  Administered 2020-07-10: 1000 mg via ORAL
  Filled 2020-07-10: qty 2

## 2020-07-10 NOTE — ED Notes (Signed)
Patient eating dinner tray. 

## 2020-07-10 NOTE — ED Notes (Signed)
Pt in xray

## 2020-07-10 NOTE — ED Notes (Signed)
VOL/  PENDING  CONSULT 

## 2020-07-10 NOTE — ED Notes (Signed)
Psych at bedside.

## 2020-07-10 NOTE — ED Triage Notes (Signed)
C/O right shoulder pain x 1 week.  Last night patient fell and landed on right shoulder, pain has worsened and unable to lift arm.  + radial pulse.  Brisk cap refill to right extremity.  Right shoulder deformity noted.
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
## 2020-07-10 NOTE — ED Provider Notes (Signed)
[facility] Emergency Department Provider Note  ____________________________________________  Time seen: Approximately 10:53 AM  I have reviewed the triage vital signs and the nursing notes.   HISTORY  Chief Complaint Shoulder Injury    Level 5 Caveat: Portions of the History and Physical including HPI and review of systems are unable to be completely obtained due to patient being a poor historian   HPI Stacey Boyd is a 75 y.o. female with a history of depression diabetes hypertension anxiety who was brought to the ED complaining of right shoulder pain.  Last night she fell on the right shoulder, has severe pain on that side, worse with movement, no alleviating factors, nonradiating.  Constant.  Patient notes that she has been having shoulder pain for a week, but got worse yesterday after she tripped on a vine while running through the woods at night, causing her to fall into a tree and hit the right shoulder.  Additional history is obtained from the son who also notes that mother's behaviors been bizarre lately, she seems paranoid of there being people in the attic, was found running through the woods at night.  Patient denies any trouble managing her schedule, keeping appointments, doing grocery shopping, managing finances.      Past Medical History:  Diagnosis Date  . Allergy   . Anxiety   . Arthritis    legs  . Cancer ([place])    skin cancer on right shoulder.   . Depression   . Diabetes mellitus without complication ([place])   . Diverticulitis   . Dyspnea    with exertion  . Glaucoma   . Hyperlipidemia   . Hypertension    controlled on meds  . Hypothyroidism   . Liver cyst   . Osteoporosis   . Shoulder pain, left    and arm  . Thyroid disease   . Wrist fracture 2001   right     Patient Active Problem List   Diagnosis Date Noted  . Hepatic cyst 04/23/2020  . Vertigo 02/07/2020  . Acute recurrent pansinusitis 02/05/2020  . Chronic  left shoulder pain 10/31/2018  . Recurrent cold sores 10/31/2018  . Restless leg syndrome 05/31/2018  . Dysuria 05/31/2018  . Tardive dyskinesia 04/07/2018  . Radiculopathy, sacral and sacrococcygeal region 04/06/2018  . Uncontrolled type 2 diabetes mellitus with hyperglycemia ([place]) 02/10/2018  . Enlarged thyroid 02/10/2018  . Needs flu shot 02/10/2018  . Acquired hypothyroidism 12/29/2017  . Peripheral polyneuropathy 12/04/2017  . Other fatigue 12/04/2017  . Auditory hallucination 12/04/2017  . Primary insomnia 12/04/2017  . Delusional disorder ([place]) 10/10/2017  . Acute midline low back pain without sciatica 09/09/2017  . Sacral bruising, initial encounter 09/09/2017  . Neoplasm of uncertain behavior of skin 09/09/2017  . Impaired fasting glucose 09/09/2017  . Vitamin D deficiency 09/09/2017  . Diverticulitis of large intestine without bleeding 01/07/2016  . Type 2 diabetes mellitus with stage 3 chronic kidney disease ([place]) 02/18/2015  . HTN, goal below 140/90 02/18/2015  . Osteopenia 11/15/2014  . Encounter for general adult medical examination with abnormal findings 09/24/2014  . Elevated serum creatinine 09/24/2014  . Pain in the coccyx 09/20/2013  . Routine general medical examination at a health care facility 09/20/2013  . Hyperglycemia 03/15/2013  . Essential hypertension, benign 03/15/2013  . Pure hypercholesterolemia 03/15/2013  . OA (osteoarthritis) 03/15/2013  . Glaucoma 03/15/2013     Past Surgical History:  Procedure Laterality Date  . ABDOMINAL HYSTERECTOMY  2005  . APPENDECTOMY  [place] Left 09/17/2015   Procedure: Left Heart Cath and Coronary Angiography;  Surgeon: Teodoro Spray, MD;  Location: [place] [facility];  Service: Cardiovascular;  Laterality: Left;  . CARPAL TUNNEL RELEASE    . CATARACT EXTRACTION W/PHACO Left 09/25/2019   Procedure: CATARACT EXTRACTION PHACO AND INTRAOCULAR LENS PLACEMENT (IOC) LEFT   4.45  00:36.1;   Surgeon: Birder Robson, MD;  Location: [place];  Service: Ophthalmology;  Laterality: Left;  . CATARACT EXTRACTION W/PHACO Right 10/16/2019   Procedure: CATARACT EXTRACTION PHACO AND INTRAOCULAR LENS PLACEMENT (IOC)right  DIABETIC;  Surgeon: Birder Robson, MD;  Location: [place];  Service: Ophthalmology;  Laterality: Right;  5.29 0:33.7  . COLONOSCOPY WITH PROPOFOL N/A 11/07/2017   Procedure: COLONOSCOPY WITH PROPOFOL;  Surgeon: Jonathon Bellows, MD;  [facility] ENDOSCOPY;  Service: Gastroenterology;  Laterality: N/A;  . FOOT SURGERY Left    2 nodules removed  . FRACTURE SURGERY Right    wrist  . WRIST FRACTURE SURGERY Right      Prior to Admission medications   Medication Sig Start Date End Date Taking? Authorizing Provider  Acetaminophen (TYLENOL ARTHRITIS PAIN PO) Take by mouth as needed.    [provider]  atorvastatin (LIPITOR) 10 MG tablet Take 1 tablet (10 mg total) by mouth daily. FOR CHOLESTEROL 03/11/20   Lavera Guise, MD  azelastine (ASTELIN) 0.1 % nasal spray Place 2 sprays into both nostrils 2 (two) times daily. Use in each nostril as directed 03/12/20   Lavera Guise, MD  Cholecalciferol (VITAMIN D3) 5000 units TABS Take 5,000 Units by mouth daily.     [provider]  dorzolamide-timolol (COSOPT) 22.3-6.8 MG/ML ophthalmic solution Place 1 drop into both eyes 2 (two) times daily.  05/26/18   [provider]  fexofenadine (ALLEGRA) 180 MG tablet Take 180 mg by mouth daily.    [provider]  gabapentin (NEURONTIN) 100 MG capsule Take 100 mg by mouth 3 (three) times daily.    [provider]  glimepiride (AMARYL) 1 MG tablet TAKE ONE TAB WITH LUNCH FOR DM 03/11/20   Lavera Guise, MD  latanoprost (XALATAN) 0.005 % ophthalmic solution 1 drop nightly.    [provider]  lisinopril-hydrochlorothiazide (ZESTORETIC) 20-12.5 MG tablet Take 2 tablets by mouth daily. 04/30/19   [provider]   meclizine (ANTIVERT) 12.5 MG tablet Take 1 tablet by mouth 2 (two) times daily as needed. 06/12/20   [provider]  Melatonin 10 MG TABS Take 20 mg by mouth as needed.     [provider]  mirtazapine (REMERON SOL-TAB) 45 MG disintegrating tablet Take 1 tablet (45 mg total) by mouth at bedtime. 06/20/20   McDonough, Si Gaul, PA-C  Multiple Vitamins-Minerals (SENTRY SENIOR) TABS Take by mouth daily. Eye vitamin    [provider]  nitrofurantoin, macrocrystal-monohydrate, (MACROBID) 100 MG capsule Use as instructed 07/03/20   McDonough, Si Gaul, PA-C  propranolol (INDERAL) 10 MG tablet Take 1 tablet (10 mg total) by mouth 2 (two) times daily. 04/23/20   Ronnell Freshwater, NP  rOPINIRole (REQUIP) 2 MG tablet Take 2 mg by mouth at bedtime.    [provider]  sertraline (ZOLOFT) 100 MG tablet Take 1 tablet (100 mg total) by mouth daily. 06/20/20   McDonough, Si Gaul, PA-C  traZODone (DESYREL) 50 MG tablet Take 0.5-1 tablets (25-50 mg total) by mouth at bedtime as needed for sleep. 06/20/20   McDonough, Si Gaul, PA-C  valACYclovir (VALTREX) 1000 MG tablet Take 1 tablet (1,000 mg total) by mouth as directed. Take 2 by mouth at onset then 2 by mouth 12 hours later. 09/19/19   Moye, [place], MD     Allergies Codeine   Family History  Problem Relation Age of Onset  . Heart disease Mother        h/o rheumatic fever  . Heart disease Father   . Stroke Father   . Diabetes Sister   . Diabetes Brother   . Diabetes Sister   . Mental illness Other   . Colon cancer Neg Hx   . Breast cancer Neg Hx     Social History Social History   Tobacco Use  . Smoking status: Former Smoker    Packs/day: 0.25    Years: 10.00    Pack years: 2.50    Types: Cigarettes    Quit date: 09/17/1979    Years since quitting: 40.8  . Smokeless tobacco: Never Used  Vaping Use  . Vaping Use: Never used  Substance Use Topics  . Alcohol use: No  . Drug use: No    Review of  Systems Level 5 Caveat: Portions of the History and Physical including HPI and review of systems are unable to be completely obtained due to patient being a poor historian   Constitutional:   No known fever.  ENT:   No rhinorrhea. Cardiovascular:   No chest pain or syncope. Respiratory:   No dyspnea or cough. Gastrointestinal:   Negative for abdominal pain, vomiting and diarrhea.  Musculoskeletal:   Right shoulder pain as above ____________________________________________   PHYSICAL EXAM:  VITAL SIGNS: ED Triage Vitals  Enc Vitals Group     BP 07/10/20 1020 138/66     Pulse Rate 07/10/20 1020 62     Resp 07/10/20 1020 16     Temp 07/10/20 1020 98.4 F (36.9 C)     Temp src --      SpO2 07/10/20 1020 98 %     Weight 07/10/20 1018 167 lb (75.8 kg)     Height 07/10/20 1018 5\' 3"  (1.6 m)     Head Circumference --      Peak Flow --      Pain Score 07/10/20 1018 10     Pain Loc --      Pain Edu? --      Excl. in [place]? --     Vital signs reviewed, nursing assessments reviewed.   Constitutional:   Alert and oriented. Non-toxic appearance. Eyes:   Conjunctivae are normal. EOMI. PERRL. ENT      Head:   Normocephalic and atraumatic.      Nose:   No congestion/rhinnorhea.       Mouth/Throat:   MMM, no pharyngeal erythema. No peritonsillar mass.       Neck:   No meningismus. Full ROM. Hematological/Lymphatic/Immunilogical:   No cervical lymphadenopathy. Cardiovascular:   RRR. Symmetric bilateral radial and DP pulses.  No murmurs. Cap refill less than 2 seconds. Respiratory:   Normal respiratory effort without tachypnea/retractions. Breath sounds are clear and equal bilaterally. No wheezes/rales/rhonchi. Gastrointestinal:   Soft and nontender. Non distended. There is no CVA tenderness.  No rebound, rigidity, or guarding.  Musculoskeletal:   Normal range of motion in all extremities.  Pain with range of motion of the right shoulder.  No joint effusions.  No lower extremity tenderness.   No edema.  No focal bony tenderness Neurologic:   Normal speech and language.  Motor grossly intact. No acute focal neurologic deficits are appreciated.  Skin:    Skin is warm, dry and intact. No rash noted.  No petechiae, purpura, or bullae.  ____________________________________________    LABS (pertinent positives/negatives) (all labs ordered are listed, but only abnormal results are displayed) Labs Reviewed  ACETAMINOPHEN LEVEL  COMPREHENSIVE METABOLIC PANEL  SALICYLATE LEVEL  CBC WITH DIFFERENTIAL/PLATELET  URINALYSIS, COMPLETE (UACMP) WITH MICROSCOPIC  URINE DRUG SCREEN, QUALITATIVE ([place])  TSH   ____________________________________________   EKG    ____________________________________________    RADIOLOGY  DG Shoulder Right  Result Date: 07/10/2020 CLINICAL DATA:  Right shoulder pain for 1 week. Increased pain after a fall last night. EXAM: RIGHT SHOULDER - 2+ VIEW COMPARISON:  None. FINDINGS: No acute fracture or dislocation is identified. No significant glenohumeral or acromioclavicular arthropathy is evident for age. The soft tissues are unremarkable. IMPRESSION: Negative. Electronically Signed   By: Logan Bores M.D.   On: 07/10/2020 11:12    ____________________________________________   PROCEDURES Procedures  ____________________________________________  DIFFERENTIAL DIAGNOSIS   Humerus fracture, shoulder dislocation, clavicle fracture, AC joint separation, delirium, intoxication, psychosis  CLINICAL IMPRESSION / ASSESSMENT AND PLAN / ED COURSE  Medications ordered in the ED: Medications  lidocaine (LIDODERM) 5 % 1 patch (1 patch Transdermal Patch Applied 07/10/20 1120)  acetaminophen (TYLENOL) tablet 1,000 mg (1,000 mg Oral Given 07/10/20 1123)    Pertinent labs & imaging results that were available during my care of the patient were reviewed by me and considered in my medical decision making (see chart for details).   Stacey Boyd was  evaluated in Emergency Department on 07/10/2020 for the symptoms described in the history of present illness. She was evaluated in the context of the global COVID-19 pandemic, which necessitated consideration that the patient might be at risk for infection with the SARS-CoV-2 virus that causes COVID-19. Institutional protocols and algorithms that pertain to the evaluation of patients at risk for COVID-19 are in a state of rapid change based on information released by regulatory bodies including the CDC and federal and state organizations. These policies and algorithms were followed during the patient's care in the ED.   Patient presents with shoulder pain after mechanical fall last night.  Family reports concerns of hallucinations and bizarre behavior, but currently patient is calm and lucid.  Vital signs are normal.  Will check x-ray, labs.  Clinical Course as of 07/10/20 1449  Thu Jul 10, 2020  1106 Shoulder x-ray viewed and interpreted by me, no fracture, no dislocation.  Right lung appears normal. [PS]  1448 Labs unremarkable. [PS]    Clinical Course User Index [PS] Carrie Mew, MD     ____________________________________________   FINAL CLINICAL IMPRESSION(S) / ED DIAGNOSES    Final diagnoses:  Acute pain of right shoulder  Behavior concern in adult     ED Discharge Orders    None      Portions of this note were generated with dragon dictation software. Dictation errors may occur despite best attempts at proofreading.   Carrie Mew, MD 07/10/20 1255

## 2020-07-10 NOTE — ED Notes (Addendum)
PTs son called and spoke with this RN about his concerns with his mother. Son stated that he would like for his mother to have a evaluation done by a psychiatrist. States that pt was found last night around 0130 in the woods by [place] PD. Pt told PD that she was running from the people in her attic that were trying to attack her. PD took pt back home and inspected the attic, nothing to be seen. Per son pt has been having issues with hallucinations for a while now and recently admitted to BMU. This RN stated that I would let MD know.

## 2020-07-10 NOTE — ED Notes (Signed)
Called dietary for meal tray

## 2020-07-10 NOTE — Consult Note (Signed)
[facility] Face-to-Face Psychiatry Consult   Reason for Consult: Consult for this 75 year old woman with a history of prior psychotic symptoms brought into the emergency room because of worsening auditory hallucinations and behavior problems Referring Physician: Joni Fears Patient Identification: Stacey Boyd MRN:  [id number] Principal Diagnosis: Psychosis ([place]) Diagnosis:  Principal Problem:   Psychosis ([place]) Active Problems:   OA (osteoarthritis)   Auditory hallucination   Total Time spent with patient: 1 hour  Subjective:   Stacey Boyd is a 75 y.o. female patient admitted with "I tripped over a branch".  HPI: Patient seen chart reviewed.  Patient known from some previous encounters.  75 year old woman brought to the emergency room in the company of her son.  Patient initially presents to me that her chief complaint is the pain in her right shoulder that resulted from tripping in a over a branch last night.  At the prompting of her son she admits that the reason she tripped over the branch was that it was after midnight when she was running across the lawn and the pitch dark.  She was doing this because she woke up with auditory hallucinations believing she was hearing voices coming from the ceiling of her room saying that they were going to kill her.  Patient says as soon as she heard them she jumped up and ran out of the house.  Not really clear in her mind where she was running too.  Patient admits that this is happened multiple times recently.  Starting to happen several times a week these days.  She admits with prompting from her son that there have been nights when she has slept with a machete in the bed because of the hallucinations and other nights when she has slept with a baseball bat and walk around the house in the middle of the night with the bat because of the hallucinations.  Patient admits she does not hear the voices during the daytime only at night but is certain that they are not  dreams.  She has very vague delusions that there are real people in her house or in her attic were making these sounds but no really clearly formed consistent delusions.  Patient describes her mood is being bad and anxious and mostly frightened.  Sleep has been more disturbed recently.  Patient reports that she was recently started on Thorazine by her primary care doctor but that that is been about 3 weeks ago and the hallucinations have not changed in that time.  She is not drinking or using any drugs.  She denies any other medical problems.  Has not had any other changes to any medication.  Past Psychiatric History: Patient has a past history of these kind of symptoms with a vague somewhat unclear diagnosis.  The diagnosis of delusional disorder has been positive in the past mainly because she has these psychotic symptoms but without schizophrenia or a clear mood component.  However, the hallucinations are actually the primary issue rather than the delusions.  Furthermore she does not appear to have a significant degree of dementia or any other neurologic problem to account for the hallucinations.  She has a history of agitated behavior similar to what she is showing now in response to these symptoms.  She also describes herself as having longstanding chronic anxiety.  She has had prior hospitalization and medication trials  Risk to Self:   Risk to Others:   Prior Inpatient Therapy:   Prior Outpatient Therapy:    Past  Medical History:  Past Medical History:  Diagnosis Date  . Allergy   . Anxiety   . Arthritis    legs  . Cancer ([place])    skin cancer on right shoulder.   . Depression   . Diabetes mellitus without complication ([place])   . Diverticulitis   . Dyspnea    with exertion  . Glaucoma   . Hyperlipidemia   . Hypertension    controlled on meds  . Hypothyroidism   . Liver cyst   . Osteoporosis   . Shoulder pain, left    and arm  . Thyroid disease   . Wrist fracture 2001   right     Past Surgical History:  Procedure Laterality Date  . ABDOMINAL HYSTERECTOMY  2005  . APPENDECTOMY  1980  . CARDIAC CATHETERIZATION Left 09/17/2015   Procedure: Left Heart Cath and Coronary Angiography;  Surgeon: Teodoro Spray, MD;  Location: [facility];  Service: Cardiovascular;  Laterality: Left;  . CARPAL TUNNEL RELEASE    . CATARACT EXTRACTION W/PHACO Left 09/25/2019   Procedure: CATARACT EXTRACTION PHACO AND INTRAOCULAR LENS PLACEMENT (IOC) LEFT   4.45  00:36.1;  Surgeon: Birder Robson, MD;  Location: [facility];  Service: Ophthalmology;  Laterality: Left;  . CATARACT EXTRACTION W/PHACO Right 10/16/2019   Procedure: CATARACT EXTRACTION PHACO AND INTRAOCULAR LENS PLACEMENT (IOC)right  DIABETIC;  Surgeon: Birder Robson, MD;  Location: [facility];  Service: Ophthalmology;  Laterality: Right;  5.29 0:33.7  . COLONOSCOPY WITH PROPOFOL N/A 11/07/2017   Procedure: COLONOSCOPY WITH PROPOFOL;  Surgeon: Jonathon Bellows, MD;  Location: [facility];  Service: Gastroenterology;  Laterality: N/A;  . FOOT SURGERY Left    2 nodules removed  . FRACTURE SURGERY Right    wrist  . WRIST FRACTURE SURGERY Right    Family History:  Family History  Problem Relation Age of Onset  . Heart disease Mother        h/o rheumatic fever  . Heart disease Father   . Stroke Father   . Diabetes Sister   . Diabetes Brother   . Diabetes Sister   . Mental illness Other   . Colon cancer Neg Hx   . Breast cancer Neg Hx    Family Psychiatric  History: None reported Social History:  Social History   Substance and Sexual Activity  Alcohol Use No     Social History   Substance and Sexual Activity  Drug Use No    Social History   Socioeconomic History  . Marital status: Divorced    Spouse name: Not on file  . Number of children: 3  . Years of education: Not on file  . Highest education level: 10th grade  Occupational History  . Not on file  Tobacco Use  . Smoking status:  Former Smoker    Packs/day: 0.25    Years: 10.00    Pack years: 2.50    Types: Cigarettes    Quit date: 09/17/1979    Years since quitting: 40.8  . Smokeless tobacco: Never Used  Vaping Use  . Vaping Use: Never used  Substance and Sexual Activity  . Alcohol use: No  . Drug use: No  . Sexual activity: Yes    Partners: Male    Birth control/protection: None  Other Topics Concern  . Not on file  Social History Narrative   3 kids, local   Lives with daughter as of 2016   Divorced ~2002   Social Determinants of Health  Financial Resource Strain: Not on file  Food Insecurity: Not on file  Transportation Needs: Not on file  Physical Activity: Not on file  Stress: Not on file  Social Connections: Not on file   Additional Social History:    Allergies:   Allergies  Allergen Reactions  . Codeine Hives and Nausea And Vomiting    Labs:  Results for orders placed or performed during the hospital encounter of 07/10/20 (from the past 48 hour(s))  Urinalysis, Complete w Microscopic     Status: Abnormal   Collection Time: 07/10/20 12:55 PM  Result Value Ref Range   Color, Urine YELLOW (A) YELLOW   APPearance CLEAR (A) CLEAR   Specific Gravity, Urine 1.010 1.005 - 1.030   pH 6.0 5.0 - 8.0   Glucose, UA NEGATIVE NEGATIVE mg/dL   Hgb urine dipstick NEGATIVE NEGATIVE   Bilirubin Urine NEGATIVE NEGATIVE   Ketones, ur NEGATIVE NEGATIVE mg/dL   Protein, ur NEGATIVE NEGATIVE mg/dL   Nitrite NEGATIVE NEGATIVE   Leukocytes,Ua NEGATIVE NEGATIVE   RBC / HPF 0-5 0 - 5 RBC/hpf   WBC, UA 0-5 0 - 5 WBC/hpf   Bacteria, UA NONE SEEN NONE SEEN   Squamous Epithelial / LPF 0-5 0 - 5   Mucus PRESENT    Hyaline Casts, UA PRESENT     Comment: Performed at [facility], [address] [place], [place] [address]  Urine Drug Screen, Qualitative     Status: None   Collection Time: 07/10/20 12:55 PM  Result Value Ref Range   Tricyclic, Ur Screen NONE DETECTED NONE DETECTED    Amphetamines, Ur Screen NONE DETECTED NONE DETECTED   MDMA (Ecstasy)Ur Screen NONE DETECTED NONE DETECTED   Cocaine Metabolite,Ur [place] NONE DETECTED NONE DETECTED   Opiate, Ur Screen NONE DETECTED NONE DETECTED   Phencyclidine (PCP) Ur S NONE DETECTED NONE DETECTED   Cannabinoid 50 Ng, Ur [place] NONE DETECTED NONE DETECTED   Barbiturates, Ur Screen NONE DETECTED NONE DETECTED   Benzodiazepine, Ur Scrn NONE DETECTED NONE DETECTED   Methadone Scn, Ur NONE DETECTED NONE DETECTED    Comment: (NOTE) Tricyclics + metabolites, urine    Cutoff 1000 ng/mL Amphetamines + metabolites, urine  Cutoff 1000 ng/mL MDMA (Ecstasy), urine              Cutoff 500 ng/mL Cocaine Metabolite, urine          Cutoff 300 ng/mL Opiate + metabolites, urine        Cutoff 300 ng/mL Phencyclidine (PCP), urine         Cutoff 25 ng/mL Cannabinoid, urine                 Cutoff 50 ng/mL Barbiturates + metabolites, urine  Cutoff 200 ng/mL Benzodiazepine, urine              Cutoff 200 ng/mL Methadone, urine                   Cutoff 300 ng/mL  The urine drug screen provides only a preliminary, unconfirmed analytical test result and should not be used for non-medical purposes. Clinical consideration and professional judgment should be applied to any positive drug screen result due to possible interfering substances. A more specific alternate chemical method must be used in order to obtain a confirmed analytical result. Gas chromatography / mass spectrometry (GC/MS) is the preferred confirm atory method. Performed at [facility], [address], [place],  [address]   Acetaminophen level  Status: None   Collection Time: 07/10/20  1:33 PM  Result Value Ref Range   Acetaminophen (Tylenol), Serum 18 10 - 30 ug/mL    Comment: (NOTE) Therapeutic concentrations vary significantly. A range of 10-30 ug/mL  may be an effective concentration for many patients. However, some  are best treated at concentrations  outside of this range. Acetaminophen concentrations >150 ug/mL at 4 hours after ingestion  and >50 ug/mL at 12 hours after ingestion are often associated with  toxic reactions.  Performed at [facility], [address] [place], [place] [address]   Comprehensive metabolic panel     Status: Abnormal   Collection Time: 07/10/20  1:33 PM  Result Value Ref Range   Sodium 137 135 - 145 mmol/L   Potassium 3.8 3.5 - 5.1 mmol/L   Chloride 104 98 - 111 mmol/L   CO2 24 22 - 32 mmol/L   Glucose, Bld 101 (H) 70 - 99 mg/dL    Comment: Glucose reference range applies only to samples taken after fasting for at least 8 hours.   BUN 20 8 - 23 mg/dL   Creatinine, Ser 0.96 0.44 - 1.00 mg/dL   Calcium 9.1 8.9 - 10.3 mg/dL   Total Protein 7.1 6.5 - 8.1 g/dL   Albumin 4.1 3.5 - 5.0 g/dL   AST 19 15 - 41 U/L   ALT 19 0 - 44 U/L   Alkaline Phosphatase 86 38 - 126 U/L   Total Bilirubin 1.0 0.3 - 1.2 mg/dL   GFR, Estimated >60 >60 mL/min    Comment: (NOTE) Calculated using the CKD-EPI Creatinine Equation (2021)    Anion gap 9 5 - 15    Comment: Performed at [facility], [address] [place], [place] [address]  Salicylate level     Status: Abnormal   Collection Time: 07/10/20  1:33 PM  Result Value Ref Range   Salicylate Lvl <1.4 (L) 7.0 - 30.0 mg/dL    Comment: Performed at [facility], [address] [place], [place] [address]  CBC with Differential     Status: None   Collection Time: 07/10/20  1:33 PM  Result Value Ref Range   WBC 7.4 4.0 - 10.5 K/uL   RBC 4.44 3.87 - 5.11 MIL/uL   Hemoglobin 13.1 12.0 - 15.0 g/dL   HCT 39.3 36.0 - 46.0 %   MCV 88.5 80.0 - 100.0 fL   MCH 29.5 26.0 - 34.0 pg   MCHC 33.3 30.0 - 36.0 g/dL   RDW 12.7 11.5 - 15.5 %   Platelets 291 150 - 400 K/uL   nRBC 0.0 0.0 - 0.2 %   Neutrophils Relative % 55 %   Neutro Abs 4.1 1.7 - 7.7 K/uL   Lymphocytes Relative 35 %   Lymphs Abs 2.6 0.7 - 4.0 K/uL   Monocytes Relative 7 %    Monocytes Absolute 0.5 0.1 - 1.0 K/uL   Eosinophils Relative 2 %   Eosinophils Absolute 0.1 0.0 - 0.5 K/uL   Basophils Relative 1 %   Basophils Absolute 0.1 0.0 - 0.1 K/uL   Immature Granulocytes 0 %   Abs Immature Granulocytes 0.01 0.00 - 0.07 K/uL    Comment: Performed at [facility], [address] [place], [place] [address]  TSH     Status: None   Collection Time: 07/10/20  1:33 PM  Result Value Ref Range   TSH 2.535 0.350 - 4.500 uIU/mL    Comment: Performed by a  3rd Generation assay with a functional sensitivity of <=0.01 uIU/mL. Performed at [facility], [address] [place], [place] [address]     Current Facility-Administered Medications  Medication Dose Route Frequency Provider Last Rate Last Admin  . acetaminophen (TYLENOL) tablet 650 mg  650 mg Oral Q6H PRN Duffy Bruce, MD   650 mg at 07/10/20 1732  . atorvastatin (LIPITOR) tablet 10 mg  10 mg Oral Daily Duffy Bruce, MD   10 mg at 07/10/20 1618  . azelastine (ASTELIN) 0.1 % nasal spray 2 spray  2 spray Each Nare BID Duffy Bruce, MD      . dorzolamide-timolol (COSOPT) 22.3-6.8 MG/ML ophthalmic solution 1 drop  1 drop Both Eyes BID Duffy Bruce, MD      . gabapentin (NEURONTIN) capsule 100 mg  100 mg Oral TID Carrie Mew, MD      . Derrill Memo ON 07/11/2020] glimepiride (AMARYL) tablet 1 mg  1 mg Oral Q lunch Duffy Bruce, MD      . Derrill Memo ON 07/11/2020] lisinopril (ZESTRIL) tablet 40 mg  40 mg Oral Daily Carrie Mew, MD       Or  . Derrill Memo ON 07/11/2020] hydrochlorothiazide (HYDRODIURIL) tablet 25 mg  25 mg Oral Daily Carrie Mew, MD      . latanoprost (XALATAN) 0.005 % ophthalmic solution 1 drop  1 drop Both Eyes QHS Duffy Bruce, MD      . lidocaine (LIDODERM) 5 % 1 patch  1 patch Transdermal Q24H Carrie Mew, MD   1 patch at 07/10/20 1120  . mirtazapine (REMERON SOL-TAB) disintegrating tablet 45 mg  45 mg Oral QHS Duffy Bruce, MD      . propranolol (INDERAL)  tablet 10 mg  10 mg Oral BID Duffy Bruce, MD      . rOPINIRole (REQUIP) tablet 2 mg  2 mg Oral QHS Duffy Bruce, MD      . sertraline (ZOLOFT) tablet 100 mg  100 mg Oral Daily Duffy Bruce, MD   100 mg at 07/10/20 1617  . traZODone (DESYREL) tablet 25-50 mg  25-50 mg Oral QHS PRN Duffy Bruce, MD       Current Outpatient Medications  Medication Sig Dispense Refill  . Acetaminophen (TYLENOL ARTHRITIS PAIN PO) Take by mouth as needed.    Marland Kitchen atorvastatin (LIPITOR) 10 MG tablet Take 1 tablet (10 mg total) by mouth daily. FOR CHOLESTEROL 90 tablet 3  . azelastine (ASTELIN) 0.1 % nasal spray Place 2 sprays into both nostrils 2 (two) times daily. Use in each nostril as directed 30 mL 1  . Cholecalciferol (VITAMIN D3) 5000 units TABS Take 5,000 Units by mouth daily.     . dorzolamide-timolol (COSOPT) 22.3-6.8 MG/ML ophthalmic solution Place 1 drop into both eyes 2 (two) times daily.     . fexofenadine (ALLEGRA) 180 MG tablet Take 180 mg by mouth daily.    Marland Kitchen gabapentin (NEURONTIN) 100 MG capsule Take 100 mg by mouth 3 (three) times daily.    Marland Kitchen glimepiride (AMARYL) 1 MG tablet TAKE ONE TAB WITH LUNCH FOR DM 90 tablet 3  . latanoprost (XALATAN) 0.005 % ophthalmic solution 1 drop nightly.    Marland Kitchen lisinopril-hydrochlorothiazide (ZESTORETIC) 20-12.5 MG tablet Take 2 tablets by mouth daily.    . meclizine (ANTIVERT) 12.5 MG tablet Take 1 tablet by mouth 2 (two) times daily as needed.    . Melatonin 10 MG TABS Take 20 mg by mouth as needed.     . mirtazapine (REMERON SOL-TAB) 45 MG disintegrating tablet  Take 1 tablet (45 mg total) by mouth at bedtime. 30 tablet 2  . Multiple Vitamins-Minerals (SENTRY SENIOR) TABS Take by mouth daily. Eye vitamin    . nitrofurantoin, macrocrystal-monohydrate, (MACROBID) 100 MG capsule Use as instructed 20 capsule 0  . propranolol (INDERAL) 10 MG tablet Take 1 tablet (10 mg total) by mouth 2 (two) times daily. 60 tablet 2  . rOPINIRole (REQUIP) 2 MG tablet Take 2 mg by  mouth at bedtime.    . sertraline (ZOLOFT) 100 MG tablet Take 1 tablet (100 mg total) by mouth daily. 30 tablet 3  . traZODone (DESYREL) 50 MG tablet Take 0.5-1 tablets (25-50 mg total) by mouth at bedtime as needed for sleep. 30 tablet 3  . valACYclovir (VALTREX) 1000 MG tablet Take 1 tablet (1,000 mg total) by mouth as directed. Take 2 by mouth at onset then 2 by mouth 12 hours later. 30 tablet 0    Musculoskeletal: Strength & Muscle Tone: within normal limits Gait & Station: normal Patient leans: N/A  Psychiatric Specialty Exam: Physical Exam Vitals and nursing note reviewed.  Constitutional:      Appearance: She is well-developed and well-nourished.  HENT:     Head: Normocephalic and atraumatic.  Eyes:     Conjunctiva/sclera: Conjunctivae normal.     Pupils: Pupils are equal, round, and reactive to light.  Cardiovascular:     Heart sounds: Normal heart sounds.  Pulmonary:     Effort: Pulmonary effort is normal.  Abdominal:     Palpations: Abdomen is soft.  Musculoskeletal:        General: Normal range of motion.       Arms:     Cervical back: Normal range of motion.  Skin:    General: Skin is warm and dry.  Neurological:     General: No focal deficit present.     Mental Status: She is alert.  Psychiatric:        Attention and Perception: Attention normal. She perceives auditory hallucinations.        Mood and Affect: Mood is anxious.        Speech: Speech normal.        Behavior: Behavior is agitated. Behavior is not aggressive or hyperactive.        Thought Content: Thought content is paranoid. Thought content does not include homicidal or suicidal ideation.        Cognition and Memory: Cognition normal.        Judgment: Judgment is impulsive.     Review of Systems  Constitutional: Negative.   HENT: Negative.   Eyes: Negative.   Respiratory: Negative.   Cardiovascular: Negative.   Gastrointestinal: Negative.   Musculoskeletal: Negative.   Skin: Negative.    Neurological: Negative.   Psychiatric/Behavioral: Positive for hallucinations and sleep disturbance. The patient is nervous/anxious.     Blood pressure 138/64, pulse (!) 53, temperature 98.4 F (36.9 C), resp. rate 18, height 5\' 3"  (1.6 m), weight 75.8 kg, SpO2 99 %.Body mass index is 29.58 kg/m.  General Appearance: Casual  Eye Contact:  Fair  Speech:  Clear and Coherent  Volume:  Normal  Mood:  Anxious  Affect:  Congruent  Thought Process:  Coherent  Orientation:  Full (Time, Place, and Person)  Thought Content:  Illogical, Delusions and Hallucinations: Auditory  Suicidal Thoughts:  No  Homicidal Thoughts:  No  Memory:  Immediate;   Fair Recent;   Fair Remote;   Fair  Judgement:  Impaired  Insight:  Shallow  Psychomotor Activity:  Normal  Concentration:  Concentration: Fair  Recall:  [employer] of Knowledge:  Fair  Language:  Fair  Akathisia:  No  Handed:  Right  AIMS (if indicated):     Assets:  Desire for Improvement Housing Physical Health Resilience Social Support  ADL's:  Impaired  Cognition:  WNL  Sleep:        Treatment Plan Summary: Medication management and Plan 75 year old woman who is having auditory hallucinations without other specific neurologic impairment and without a clear mood syndrome.  Not reporting suicidal or homicidal ideation but displaying dangerous behaviors at home very concerning to her son in particular.  Patient's labs were reviewed.  Nothing particularly notable.  Plan will be for admission to the psychiatric ward.  We can aim for a geriatric ward as a preferred goal but if a bed is available here or at another normal adult psychiatric hospital I think it would be reasonable to give it to her since she does not appear to have a significant level of dementia and she is otherwise medically intact and fully ambulatory.  Patient agrees to plan.  Meds will be reviewed.  We will consider changing antipsychotics.  Disposition: Recommend  psychiatric Inpatient admission when medically cleared. Supportive therapy provided about ongoing stressors.  Alethia Berthold, MD 07/10/2020 7:16 PM

## 2020-07-10 NOTE — BH Assessment (Addendum)
Referral information for Psychiatric Hospitalization faxed to;   Marland Kitchen [facility] [phone]),   . [facility] [phone] -or- [phone]) [phone]  . [facility] ([phone]---[phone]),  . [facility] [phone]),   . [address]Lurena Joiner [phone]),   . [facility] [phone] or [phone])  . [facility] [phone] or [phone]),   . [facility] [phone]).

## 2020-07-11 ENCOUNTER — Encounter: Payer: Self-pay | Admitting: Psychiatry

## 2020-07-11 ENCOUNTER — Emergency Department: Payer: Medicare Other

## 2020-07-11 ENCOUNTER — Other Ambulatory Visit: Payer: Self-pay

## 2020-07-11 ENCOUNTER — Inpatient Hospital Stay
Admission: AD | Admit: 2020-07-11 | Discharge: 2020-07-16 | DRG: 884 | Disposition: A | Discharge status: Home / Self Care | Payer: Medicare Other | Source: Intra-hospital | Attending: Behavioral Health | Admitting: Behavioral Health

## 2020-07-11 DIAGNOSIS — R300 Dysuria: Secondary | ICD-10-CM | POA: Diagnosis not present

## 2020-07-11 DIAGNOSIS — F5101 Primary insomnia: Secondary | ICD-10-CM | POA: Diagnosis present

## 2020-07-11 DIAGNOSIS — Z8249 Family history of ischemic heart disease and other diseases of the circulatory system: Secondary | ICD-10-CM

## 2020-07-11 DIAGNOSIS — Z9841 Cataract extraction status, right eye: Secondary | ICD-10-CM

## 2020-07-11 DIAGNOSIS — R451 Restlessness and agitation: Secondary | ICD-10-CM | POA: Diagnosis present

## 2020-07-11 DIAGNOSIS — Z85828 Personal history of other malignant neoplasm of skin: Secondary | ICD-10-CM

## 2020-07-11 DIAGNOSIS — E785 Hyperlipidemia, unspecified: Secondary | ICD-10-CM | POA: Diagnosis present

## 2020-07-11 DIAGNOSIS — Z79899 Other long term (current) drug therapy: Secondary | ICD-10-CM

## 2020-07-11 DIAGNOSIS — G629 Polyneuropathy, unspecified: Secondary | ICD-10-CM

## 2020-07-11 DIAGNOSIS — Z9842 Cataract extraction status, left eye: Secondary | ICD-10-CM | POA: Diagnosis not present

## 2020-07-11 DIAGNOSIS — R440 Auditory hallucinations: Secondary | ICD-10-CM | POA: Diagnosis not present

## 2020-07-11 DIAGNOSIS — M25511 Pain in right shoulder: Secondary | ICD-10-CM | POA: Diagnosis not present

## 2020-07-11 DIAGNOSIS — Z961 Presence of intraocular lens: Secondary | ICD-10-CM | POA: Diagnosis present

## 2020-07-11 DIAGNOSIS — Z7984 Long term (current) use of oral hypoglycemic drugs: Secondary | ICD-10-CM | POA: Diagnosis not present

## 2020-07-11 DIAGNOSIS — Z20822 Contact with and (suspected) exposure to covid-19: Secondary | ICD-10-CM | POA: Diagnosis present

## 2020-07-11 DIAGNOSIS — E559 Vitamin D deficiency, unspecified: Secondary | ICD-10-CM | POA: Diagnosis present

## 2020-07-11 DIAGNOSIS — M1990 Unspecified osteoarthritis, unspecified site: Secondary | ICD-10-CM | POA: Diagnosis present

## 2020-07-11 DIAGNOSIS — Z823 Family history of stroke: Secondary | ICD-10-CM

## 2020-07-11 DIAGNOSIS — E1122 Type 2 diabetes mellitus with diabetic chronic kidney disease: Secondary | ICD-10-CM | POA: Diagnosis present

## 2020-07-11 DIAGNOSIS — N183 Chronic kidney disease, stage 3 unspecified: Secondary | ICD-10-CM | POA: Diagnosis present

## 2020-07-11 DIAGNOSIS — F22 Delusional disorders: Secondary | ICD-10-CM | POA: Diagnosis present

## 2020-07-11 DIAGNOSIS — I10 Essential (primary) hypertension: Secondary | ICD-10-CM | POA: Diagnosis not present

## 2020-07-11 DIAGNOSIS — Z87891 Personal history of nicotine dependence: Secondary | ICD-10-CM | POA: Diagnosis not present

## 2020-07-11 DIAGNOSIS — E039 Hypothyroidism, unspecified: Secondary | ICD-10-CM | POA: Diagnosis present

## 2020-07-11 DIAGNOSIS — Z833 Family history of diabetes mellitus: Secondary | ICD-10-CM | POA: Diagnosis not present

## 2020-07-11 DIAGNOSIS — F0390 Unspecified dementia without behavioral disturbance: Principal | ICD-10-CM | POA: Diagnosis present

## 2020-07-11 DIAGNOSIS — F4310 Post-traumatic stress disorder, unspecified: Secondary | ICD-10-CM | POA: Diagnosis present

## 2020-07-11 DIAGNOSIS — I129 Hypertensive chronic kidney disease with stage 1 through stage 4 chronic kidney disease, or unspecified chronic kidney disease: Secondary | ICD-10-CM | POA: Diagnosis present

## 2020-07-11 DIAGNOSIS — G2581 Restless legs syndrome: Secondary | ICD-10-CM | POA: Diagnosis present

## 2020-07-11 DIAGNOSIS — M8580 Other specified disorders of bone density and structure, unspecified site: Secondary | ICD-10-CM | POA: Diagnosis not present

## 2020-07-11 DIAGNOSIS — F29 Unspecified psychosis not due to a substance or known physiological condition: Secondary | ICD-10-CM | POA: Diagnosis present

## 2020-07-11 DIAGNOSIS — E782 Mixed hyperlipidemia: Secondary | ICD-10-CM

## 2020-07-11 DIAGNOSIS — R42 Dizziness and giddiness: Secondary | ICD-10-CM

## 2020-07-11 LAB — SARS CORONAVIRUS 2 (TAT 6-24 HRS): SARS Coronavirus 2: NEGATIVE

## 2020-07-11 LAB — CBG MONITORING, ED: Glucose-Capillary: 173 mg/dL — ABNORMAL HIGH (ref 70–99)

## 2020-07-11 LAB — ETHANOL: Alcohol, Ethyl (B): <10 mg/dL (ref <10)

## 2020-07-11 LAB — POC SARS CORONAVIRUS 2 AG -  ED: SARS Coronavirus 2 Ag: NEGATIVE

## 2020-07-11 MED ORDER — ALUM & MAG HYDROXIDE-SIMETH 200-200-20 MG/5ML PO SUSP: 30.0000 mL | ORAL | Status: DC | PRN

## 2020-07-11 MED ORDER — ACETAMINOPHEN 325 MG PO TABS
650.0000 mg | ORAL_TABLET | Freq: Four times a day (QID) | ORAL | Status: DC | PRN
  Administered 2020-07-14: 650 mg via ORAL
  Filled 2020-07-11: qty 2

## 2020-07-11 MED ORDER — HYDROCHLOROTHIAZIDE 25 MG PO TABS
25.0000 mg | ORAL_TABLET | Freq: Every day | ORAL | Status: DC
  Administered 2020-07-12 – 2020-07-13 (×2): 25 mg via ORAL
  Filled 2020-07-11 (×2): qty 1

## 2020-07-11 MED ORDER — ROPINIROLE HCL 1 MG PO TABS
2.0000 mg | ORAL_TABLET | Freq: Every day | ORAL | Status: DC
  Administered 2020-07-11 – 2020-07-12 (×2): 2 mg via ORAL
  Filled 2020-07-11 (×3): qty 2

## 2020-07-11 MED ORDER — MAGNESIUM HYDROXIDE 400 MG/5ML PO SUSP: 30.0000 mL | Freq: Every day | ORAL | Status: DC | PRN

## 2020-07-11 MED ORDER — MIRTAZAPINE 15 MG PO TBDP
45.0000 mg | ORAL_TABLET | Freq: Every day | ORAL | Status: DC
  Administered 2020-07-11 – 2020-07-15 (×5): 45 mg via ORAL
  Filled 2020-07-11 (×6): qty 3

## 2020-07-11 MED ORDER — TRAZODONE HCL 50 MG PO TABS
25.0000 mg | ORAL_TABLET | Freq: Every evening | ORAL | Status: DC | PRN
  Administered 2020-07-14: 50 mg via ORAL
  Filled 2020-07-11: qty 1

## 2020-07-11 MED ORDER — LISINOPRIL 20 MG PO TABS
40.0000 mg | ORAL_TABLET | Freq: Every day | ORAL | Status: DC
  Administered 2020-07-12: 40 mg via ORAL
  Filled 2020-07-11 (×2): qty 2

## 2020-07-11 MED ORDER — HYDROCHLOROTHIAZIDE 25 MG PO TABS
25.0000 mg | ORAL_TABLET | Freq: Every day | ORAL | Status: DC
Start: 2020-07-12 — End: 2020-07-11

## 2020-07-11 MED ORDER — GABAPENTIN 100 MG PO CAPS
100.0000 mg | ORAL_CAPSULE | Freq: Three times a day (TID) | ORAL | Status: DC
  Administered 2020-07-11 – 2020-07-13 (×5): 100 mg via ORAL
  Filled 2020-07-11 (×5): qty 1

## 2020-07-11 MED ORDER — PROPRANOLOL HCL 20 MG PO TABS
10.0000 mg | ORAL_TABLET | Freq: Two times a day (BID) | ORAL | Status: DC
  Administered 2020-07-12 – 2020-07-13 (×3): 10 mg via ORAL
  Filled 2020-07-11 (×3): qty 1

## 2020-07-11 MED ORDER — DORZOLAMIDE HCL-TIMOLOL MAL 2-0.5 % OP SOLN
1.0000 [drp] | Freq: Two times a day (BID) | OPHTHALMIC | Status: DC
  Administered 2020-07-12 – 2020-07-16 (×9): 1 [drp] via OPHTHALMIC
  Filled 2020-07-11: qty 10

## 2020-07-11 MED ORDER — AZELASTINE HCL 0.1 % NA SOLN
2.0000 | Freq: Two times a day (BID) | NASAL | Status: DC
  Filled 2020-07-11: qty 30

## 2020-07-11 MED ORDER — ATORVASTATIN CALCIUM 20 MG PO TABS
10.0000 mg | ORAL_TABLET | Freq: Every day | ORAL | Status: DC
  Administered 2020-07-12 – 2020-07-16 (×5): 10 mg via ORAL
  Filled 2020-07-11 (×5): qty 1

## 2020-07-11 MED ORDER — LIDOCAINE 5 % EX PTCH
1.0000 | MEDICATED_PATCH | CUTANEOUS | Status: DC
  Administered 2020-07-12 – 2020-07-16 (×5): 1 via TRANSDERMAL
  Filled 2020-07-11 (×5): qty 1

## 2020-07-11 MED ORDER — ACETAMINOPHEN 325 MG PO TABS
650.0000 mg | ORAL_TABLET | Freq: Four times a day (QID) | ORAL | Status: DC | PRN
  Administered 2020-07-12 – 2020-07-13 (×3): 650 mg via ORAL
  Filled 2020-07-11 (×3): qty 2

## 2020-07-11 MED ORDER — LATANOPROST 0.005 % OP SOLN
1.0000 [drp] | Freq: Every day | OPHTHALMIC | Status: DC
  Administered 2020-07-11 – 2020-07-15 (×5): 1 [drp] via OPHTHALMIC
  Filled 2020-07-11: qty 2.5

## 2020-07-11 MED ORDER — LISINOPRIL 20 MG PO TABS: 40.0000 mg | ORAL_TABLET | Freq: Every day | ORAL | Status: DC

## 2020-07-11 MED ORDER — RISPERIDONE 1 MG PO TBDP
1.0000 mg | ORAL_TABLET | Freq: Every day | ORAL | Status: DC
  Administered 2020-07-11 – 2020-07-13 (×3): 1 mg via ORAL
  Filled 2020-07-11 (×3): qty 1

## 2020-07-11 MED ORDER — SERTRALINE HCL 100 MG PO TABS
100.0000 mg | ORAL_TABLET | Freq: Every day | ORAL | Status: DC
  Administered 2020-07-12 – 2020-07-16 (×5): 100 mg via ORAL
  Filled 2020-07-11 (×5): qty 1

## 2020-07-11 MED ORDER — GLIMEPIRIDE 1 MG PO TABS
1.0000 mg | ORAL_TABLET | Freq: Every day | ORAL | Status: DC
  Administered 2020-07-12 – 2020-07-16 (×5): 1 mg via ORAL
  Filled 2020-07-11 (×5): qty 1

## 2020-07-11 NOTE — BH Assessment (Signed)
FYI Writer spoke with Jackelyn Poling of [facility] [phone]) who requested a CT result as well.

## 2020-07-11 NOTE — BH Assessment (Addendum)
This counselor spoke with Stacey Boyd of [facility] [phone]) concerning pt's potential for acceptance. Stacey Boyd requested pt's alcohol and covid test results be faxed prior to acceptance. EDP and pt's nurse notified.

## 2020-07-11 NOTE — ED Notes (Signed)
Report given to [facility].Marland KitchenMarland Kitchen

## 2020-07-11 NOTE — Consult Note (Signed)
[facility] Consult   Reason for Consult: Consult for this patient who eggs come into the emergency room with complaints of auditory hallucinations.  Follow-up on yesterday's evaluation Referring Physician: Tamala Julian Patient Identification: Stacey Boyd MRN:  [id number] Principal Diagnosis: Psychosis [facility]) Diagnosis:  Principal Problem:   Psychosis ([place]) Active Problems:   OA (osteoarthritis)   Auditory hallucination   Total Time spent with patient: 30 minutes  Subjective:   Stacey Boyd is a 75 y.o. female patient admitted with "I am feeling okay".  HPI: Follow-up on yesterday's evaluation.  75 year old woman with a history of recurrent episodes of auditory hallucinations but without evident dementia.  Patient had been referred out to multiple geriatric psychiatry referrals without any success at this point.  Patient states that she slept all right last night and did not have any auditory hallucinations.  Affect euthymic and calm today.  Vital signs unremarkable.  Labs stable.  CT normal.  COVID test negative.  Past Psychiatric History: Past history of recurrent spells of the same sort of thing with a psychotic diagnosis in the past  Risk to Self:   Risk to Others:   Prior Inpatient Therapy:   Prior Outpatient Therapy:    Past Medical History:  Past Medical History:  Diagnosis Date  . Allergy   . Anxiety   . Arthritis    legs  . Cancer ([place])    skin cancer on right shoulder.   . Depression   . Diabetes mellitus without complication ([place])   . Diverticulitis   . Dyspnea    with exertion  . Glaucoma   . Hyperlipidemia   . Hypertension    controlled on meds  . Hypothyroidism   . Liver cyst   . Osteoporosis   . Shoulder pain, left    and arm  . Thyroid disease   . Wrist fracture 2001   right    Past Surgical History:  Procedure Laterality Date  . ABDOMINAL HYSTERECTOMY  2005  . APPENDECTOMY  1980  . CARDIAC CATHETERIZATION Left 09/17/2015    Procedure: Left Heart Cath and Coronary Angiography;  Surgeon: Teodoro Spray, MD;  Location: [facility];  Service: Cardiovascular;  Laterality: Left;  . CARPAL TUNNEL RELEASE    . CATARACT EXTRACTION W/PHACO Left 09/25/2019   Procedure: CATARACT EXTRACTION PHACO AND INTRAOCULAR LENS PLACEMENT (IOC) LEFT   4.45  00:36.1;  Surgeon: Birder Robson, MD;  Location: [place];  Service: Ophthalmology;  Laterality: Left;  . CATARACT EXTRACTION W/PHACO Right 10/16/2019   Procedure: CATARACT EXTRACTION PHACO AND INTRAOCULAR LENS PLACEMENT (IOC)right  DIABETIC;  Surgeon: Birder Robson, MD;  Location: [place];  Service: Ophthalmology;  Laterality: Right;  5.29 0:33.7  . COLONOSCOPY WITH PROPOFOL N/A 11/07/2017   Procedure: COLONOSCOPY WITH PROPOFOL;  Surgeon: Jonathon Bellows, MD;  Location: [facility];  Service: Gastroenterology;  Laterality: N/A;  . FOOT SURGERY Left    2 nodules removed  . FRACTURE SURGERY Right    wrist  . WRIST FRACTURE SURGERY Right    Family History:  Family History  Problem Relation Age of Onset  . Heart disease Mother        h/o rheumatic fever  . Heart disease Father   . Stroke Father   . Diabetes Sister   . Diabetes Brother   . Diabetes Sister   . Mental illness Other   . Colon cancer Neg Hx   . Breast cancer Neg Hx    Family Psychiatric  History: See previous Social History:  Social History   Substance and Sexual Activity  Alcohol Use No     Social History   Substance and Sexual Activity  Drug Use No    Social History   Socioeconomic History  . Marital status: Divorced    Spouse name: Not on file  . Number of children: 3  . Years of education: Not on file  . Highest education level: 10th grade  Occupational History  . Not on file  Tobacco Use  . Smoking status: Former Smoker    Packs/day: 0.25    Years: 10.00    Pack years: 2.50    Types: Cigarettes    Quit date: 09/17/1979    Years since quitting: 40.8  .  Smokeless tobacco: Never Used  Vaping Use  . Vaping Use: Never used  Substance and Sexual Activity  . Alcohol use: No  . Drug use: No  . Sexual activity: Yes    Partners: Male    Birth control/protection: None  Other Topics Concern  . Not on file  Social History Narrative   3 kids, local   Lives with daughter as of 2016   Divorced ~2002   Social Determinants of Health   Financial Resource Strain: Not on file  Food Insecurity: Not on file  Transportation Needs: Not on file  Physical Activity: Not on file  Stress: Not on file  Social Connections: Not on file   Additional Social History:    Allergies:   Allergies  Allergen Reactions  . Codeine Hives and Nausea And Vomiting    Labs:  Results for orders placed or performed during the hospital encounter of 07/10/20 (from the past 48 hour(s))  Urinalysis, Complete w Microscopic     Status: Abnormal   Collection Time: 07/10/20 12:55 PM  Result Value Ref Range   Color, Urine YELLOW (A) YELLOW   APPearance CLEAR (A) CLEAR   Specific Gravity, Urine 1.010 1.005 - 1.030   pH 6.0 5.0 - 8.0   Glucose, UA NEGATIVE NEGATIVE mg/dL   Hgb urine dipstick NEGATIVE NEGATIVE   Bilirubin Urine NEGATIVE NEGATIVE   Ketones, ur NEGATIVE NEGATIVE mg/dL   Protein, ur NEGATIVE NEGATIVE mg/dL   Nitrite NEGATIVE NEGATIVE   Leukocytes,Ua NEGATIVE NEGATIVE   RBC / HPF 0-5 0 - 5 RBC/hpf   WBC, UA 0-5 0 - 5 WBC/hpf   Bacteria, UA NONE SEEN NONE SEEN   Squamous Epithelial / LPF 0-5 0 - 5   Mucus PRESENT    Hyaline Casts, UA PRESENT     Comment: Performed at [facility], [address] [place], [place] [address]  Urine Drug Screen, Qualitative     Status: None   Collection Time: 07/10/20 12:55 PM  Result Value Ref Range   Tricyclic, Ur Screen NONE DETECTED NONE DETECTED   Amphetamines, Ur Screen NONE DETECTED NONE DETECTED   MDMA (Ecstasy)Ur Screen NONE DETECTED NONE DETECTED   Cocaine Metabolite,Ur [place] NONE DETECTED NONE DETECTED    Opiate, Ur Screen NONE DETECTED NONE DETECTED   Phencyclidine (PCP) Ur S NONE DETECTED NONE DETECTED   Cannabinoid 50 Ng, Ur [place] NONE DETECTED NONE DETECTED   Barbiturates, Ur Screen NONE DETECTED NONE DETECTED   Benzodiazepine, Ur Scrn NONE DETECTED NONE DETECTED   Methadone Scn, Ur NONE DETECTED NONE DETECTED    Comment: (NOTE) Tricyclics + metabolites, urine    Cutoff 1000 ng/mL Amphetamines + metabolites, urine  Cutoff 1000 ng/mL MDMA (Ecstasy), urine  Cutoff 500 ng/mL Cocaine Metabolite, urine          Cutoff 300 ng/mL Opiate + metabolites, urine        Cutoff 300 ng/mL Phencyclidine (PCP), urine         Cutoff 25 ng/mL Cannabinoid, urine                 Cutoff 50 ng/mL Barbiturates + metabolites, urine  Cutoff 200 ng/mL Benzodiazepine, urine              Cutoff 200 ng/mL Methadone, urine                   Cutoff 300 ng/mL  The urine drug screen provides only a preliminary, unconfirmed analytical test result and should not be used for non-medical purposes. Clinical consideration and professional judgment should be applied to any positive drug screen result due to possible interfering substances. A more specific alternate chemical method must be used in order to obtain a confirmed analytical result. Gas chromatography / mass spectrometry (GC/MS) is the preferred confirm atory method. Performed at [facility], [address] [place], [place] [address]   Acetaminophen level     Status: None   Collection Time: 07/10/20  1:33 PM  Result Value Ref Range   Acetaminophen (Tylenol), Serum 18 10 - 30 ug/mL    Comment: (NOTE) Therapeutic concentrations vary significantly. A range of 10-30 ug/mL  may be an effective concentration for many patients. However, some  are best treated at concentrations outside of this range. Acetaminophen concentrations >150 ug/mL at 4 hours after ingestion  and >50 ug/mL at 12 hours after ingestion are often associated with   toxic reactions.  Performed at [facility], [address] [place], [place] [address]   Comprehensive metabolic panel     Status: Abnormal   Collection Time: 07/10/20  1:33 PM  Result Value Ref Range   Sodium 137 135 - 145 mmol/L   Potassium 3.8 3.5 - 5.1 mmol/L   Chloride 104 98 - 111 mmol/L   CO2 24 22 - 32 mmol/L   Glucose, Bld 101 (H) 70 - 99 mg/dL    Comment: Glucose reference range applies only to samples taken after fasting for at least 8 hours.   BUN 20 8 - 23 mg/dL   Creatinine, Ser 0.96 0.44 - 1.00 mg/dL   Calcium 9.1 8.9 - 10.3 mg/dL   Total Protein 7.1 6.5 - 8.1 g/dL   Albumin 4.1 3.5 - 5.0 g/dL   AST 19 15 - 41 U/L   ALT 19 0 - 44 U/L   Alkaline Phosphatase 86 38 - 126 U/L   Total Bilirubin 1.0 0.3 - 1.2 mg/dL   GFR, Estimated >60 >60 mL/min    Comment: (NOTE) Calculated using the CKD-EPI Creatinine Equation (2021)    Anion gap 9 5 - 15    Comment: Performed at [facility], [address] [place], [place] [address]  Salicylate level     Status: Abnormal   Collection Time: 07/10/20  1:33 PM  Result Value Ref Range   Salicylate Lvl <3.5 (L) 7.0 - 30.0 mg/dL    Comment: Performed at [facility] [address] [place], [place] [address]  CBC with Differential     Status: None   Collection Time: 07/10/20  1:33 PM  Result Value Ref Range   WBC 7.4 4.0 - 10.5 K/uL   RBC 4.44 3.87 - 5.11 MIL/uL   Hemoglobin 13.1  12.0 - 15.0 g/dL   HCT 39.3 36.0 - 46.0 %   MCV 88.5 80.0 - 100.0 fL   MCH 29.5 26.0 - 34.0 pg   MCHC 33.3 30.0 - 36.0 g/dL   RDW 12.7 11.5 - 15.5 %   Platelets 291 150 - 400 K/uL   nRBC 0.0 0.0 - 0.2 %   Neutrophils Relative % 55 %   Neutro Abs 4.1 1.7 - 7.7 K/uL   Lymphocytes Relative 35 %   Lymphs Abs 2.6 0.7 - 4.0 K/uL   Monocytes Relative 7 %   Monocytes Absolute 0.5 0.1 - 1.0 K/uL   Eosinophils Relative 2 %   Eosinophils Absolute 0.1 0.0 - 0.5 K/uL   Basophils Relative 1 %   Basophils Absolute 0.1 0.0 -  0.1 K/uL   Immature Granulocytes 0 %   Abs Immature Granulocytes 0.01 0.00 - 0.07 K/uL    Comment: Performed at [facility], [address] [place], [place] [address]  TSH     Status: None   Collection Time: 07/10/20  1:33 PM  Result Value Ref Range   TSH 2.535 0.350 - 4.500 uIU/mL    Comment: Performed by a 3rd Generation assay with a functional sensitivity of <=0.01 uIU/mL. Performed at [facility], [place], [place] [address]   SARS CORONAVIRUS 2 (TAT 6-24 HRS) Nasopharyngeal Nasopharyngeal Swab     Status: None   Collection Time: 07/11/20  5:21 AM   Specimen: Nasopharyngeal Swab  Result Value Ref Range   SARS Coronavirus 2 NEGATIVE NEGATIVE    Comment: (NOTE) SARS-CoV-2 target nucleic acids are NOT DETECTED.  The SARS-CoV-2 RNA is generally detectable in upper and lower respiratory specimens during the acute phase of infection. Negative results do not preclude SARS-CoV-2 infection, do not rule out co-infections with other pathogens, and should not be used as the sole basis for treatment or other patient management decisions. Negative results must be combined with clinical observations, patient history, and epidemiological information. The expected result is Negative.  Fact Sheet for Patients: SugarRoll.be  Fact Sheet for Healthcare Providers: https://www.woods-mathews.com/  This test is not yet approved or cleared by the [place] FDA and  has been authorized for detection and/or diagnosis of SARS-CoV-2 by FDA under an Emergency Use Authorization (EUA). This EUA will remain  in effect (meaning this test can be used) for the duration of the COVID-19 declaration under Se ction 564(b)(1) of the Act, 21 U.S.C. section 360bbb-3(b)(1), unless the authorization is terminated or revoked sooner.  Performed at [facility], [place], [place], [place] [address]   Ethanol     Status:  None   Collection Time: 07/11/20  5:21 AM  Result Value Ref Range   Alcohol, Ethyl (B) <10 <10 mg/dL    Comment: (NOTE) Lowest detectable limit for serum alcohol is 10 mg/dL.  For medical purposes only. Performed at [facility], [place], [place] [address]   POC SARS Coronavirus 2 Ag-ED - Nasal Swab (BD Veritor Kit)     Status: None   Collection Time: 07/11/20 10:32 AM  Result Value Ref Range   SARS Coronavirus 2 Ag NEGATIVE NEGATIVE    Comment: (NOTE) SARS-CoV-2 antigen NOT DETECTED.   Negative results are presumptive.  Negative results do not preclude SARS-CoV-2 infection and should not be used as the sole basis for treatment or other patient management decisions, including infection  control decisions, particularly in the presence of clinical signs  and  symptoms consistent with COVID-19, or in those who have been in contact with the virus.  Negative results must be combined with clinical observations, patient history, and epidemiological information. The expected result is Negative.  Fact Sheet for Patients: HandmadeRecipes.com.cy  Fact Sheet for Healthcare Providers: FuneralLife.at  This test is not yet approved or cleared by the [place] FDA and  has been authorized for detection and/or diagnosis of SARS-CoV-2 by FDA under an Emergency Use Authorization (EUA).  This EUA will remain in effect (meaning this test can be used) for the duration of  the COV ID-19 declaration under Section 564(b)(1) of the Act, 21 U.S.C. section 360bbb-3(b)(1), unless the authorization is terminated or revoked sooner.      Current Facility-Administered Medications  Medication Dose Route Frequency Provider Last Rate Last Admin  . acetaminophen (TYLENOL) tablet 650 mg  650 mg Oral Q6H PRN Duffy Bruce, MD   650 mg at 07/11/20 0934  . atorvastatin (LIPITOR) tablet 10 mg  10 mg Oral Daily Duffy Bruce, MD   10  mg at 07/11/20 0839  . azelastine (ASTELIN) 0.1 % nasal spray 2 spray  2 spray Each Nare BID Duffy Bruce, MD      . dorzolamide-timolol (COSOPT) 22.3-6.8 MG/ML ophthalmic solution 1 drop  1 drop Both Eyes BID Duffy Bruce, MD   1 drop at 07/11/20 0934  . gabapentin (NEURONTIN) capsule 100 mg  100 mg Oral TID Carrie Mew, MD   100 mg at 07/11/20 1408  . glimepiride (AMARYL) tablet 1 mg  1 mg Oral Q lunch Duffy Bruce, MD   1 mg at 07/11/20 1408  . lisinopril (ZESTRIL) tablet 40 mg  40 mg Oral Daily Carrie Mew, MD   40 mg at 07/11/20 0935   Or  . hydrochlorothiazide (HYDRODIURIL) tablet 25 mg  25 mg Oral Daily Carrie Mew, MD      . latanoprost (XALATAN) 0.005 % ophthalmic solution 1 drop  1 drop Both Eyes QHS Duffy Bruce, MD   1 drop at 07/10/20 2112  . lidocaine (LIDODERM) 5 % 1 patch  1 patch Transdermal Q24H Carrie Mew, MD   1 patch at 07/11/20 1408  . mirtazapine (REMERON SOL-TAB) disintegrating tablet 45 mg  45 mg Oral Gloriajean Dell, MD   45 mg at 07/10/20 2117  . propranolol (INDERAL) tablet 10 mg  10 mg Oral BID Duffy Bruce, MD   10 mg at 07/11/20 0840  . risperiDONE (RISPERDAL M-TABS) disintegrating tablet 1 mg  1 mg Oral QHS ,  T, MD   1 mg at 07/10/20 2123  . rOPINIRole (REQUIP) tablet 2 mg  2 mg Oral QHS Duffy Bruce, MD   2 mg at 07/10/20 2121  . sertraline (ZOLOFT) tablet 100 mg  100 mg Oral Daily Duffy Bruce, MD   100 mg at 07/11/20 0840  . traZODone (DESYREL) tablet 25-50 mg  25-50 mg Oral QHS PRN Duffy Bruce, MD       Current Outpatient Medications  Medication Sig Dispense Refill  . azelastine (ASTELIN) 0.1 % nasal spray Place 2 sprays into both nostrils 2 (two) times daily. Use in each nostril as directed 30 mL 1  . Cholecalciferol (VITAMIN D3) 5000 units TABS Take 5,000 Units by mouth daily.     . Dorzolamide HCl-Timolol Mal PF 2-0.5 % SOLN Apply 1 drop to eye 2 (two) times daily.    . fexofenadine  (ALLEGRA) 180 MG tablet Take 180 mg by mouth daily.    Marland Kitchen  lisinopril-hydrochlorothiazide (ZESTORETIC) 20-12.5 MG tablet Take 2 tablets by mouth daily.    . Multiple Vitamins-Minerals (SENTRY SENIOR) TABS Take 1 tablet by mouth daily. Eye vitamin    . nitrofurantoin, macrocrystal-monohydrate, (MACROBID) 100 MG capsule Use as instructed (Patient taking differently: Take 100 mg by mouth 2 (two) times daily. Use as instructed) 20 capsule 0  . rOPINIRole (REQUIP) 2 MG tablet Take 2 mg by mouth at bedtime.    . sertraline (ZOLOFT) 100 MG tablet Take 1 tablet (100 mg total) by mouth daily. 30 tablet 3  . Acetaminophen (TYLENOL ARTHRITIS PAIN PO) Take 650-1,300 mg by mouth as needed.    Marland Kitchen atorvastatin (LIPITOR) 10 MG tablet Take 1 tablet (10 mg total) by mouth daily. FOR CHOLESTEROL 90 tablet 3  . gabapentin (NEURONTIN) 100 MG capsule Take 100 mg by mouth 3 (three) times daily. (Patient not taking: No sig reported)    . glimepiride (AMARYL) 1 MG tablet TAKE ONE TAB WITH LUNCH FOR DM (Patient taking differently: Take 1 mg by mouth daily with lunch. TAKE ONE TAB WITH LUNCH FOR DM) 90 tablet 3  . latanoprost (XALATAN) 0.005 % ophthalmic solution Place 1 drop into both eyes at bedtime.    . meclizine (ANTIVERT) 12.5 MG tablet Take 1 tablet by mouth 2 (two) times daily as needed. (Patient not taking: No sig reported)    . Melatonin 10 MG TABS Take 20 mg by mouth as needed.     . mirtazapine (REMERON SOL-TAB) 45 MG disintegrating tablet Take 1 tablet (45 mg total) by mouth at bedtime. 30 tablet 2  . propranolol (INDERAL) 10 MG tablet Take 1 tablet (10 mg total) by mouth 2 (two) times daily. (Patient not taking: No sig reported) 60 tablet 2  . traZODone (DESYREL) 50 MG tablet Take 0.5-1 tablets (25-50 mg total) by mouth at bedtime as needed for sleep. 30 tablet 3  . valACYclovir (VALTREX) 1000 MG tablet Take 1 tablet (1,000 mg total) by mouth as directed. Take 2 by mouth at onset then 2 by mouth 12 hours later. 30  tablet 0    Musculoskeletal: Strength & Muscle Tone: within normal limits Gait & Station: normal Patient leans: N/A  Psychiatric Specialty Exam: Physical Exam Constitutional:      Appearance: She is well-developed and well-nourished.  HENT:     Head: Normocephalic and atraumatic.  Eyes:     Conjunctiva/sclera: Conjunctivae normal.     Pupils: Pupils are equal, round, and reactive to light.  Cardiovascular:     Heart sounds: Normal heart sounds.  Pulmonary:     Effort: Pulmonary effort is normal.  Abdominal:     Palpations: Abdomen is soft.  Musculoskeletal:        General: Normal range of motion.     Cervical back: Normal range of motion.  Skin:    General: Skin is warm and dry.  Neurological:     General: No focal deficit present.     Mental Status: She is alert.  Psychiatric:        Attention and Perception: Attention normal. She perceives auditory hallucinations.        Mood and Affect: Mood is anxious.        Speech: Speech is delayed.        Behavior: Behavior is cooperative.        Thought Content: Thought content is paranoid. Thought content does not include homicidal or suicidal ideation.        Cognition and Memory: Cognition normal.  Judgment: Judgment normal.     Review of Systems  Constitutional: Negative.   HENT: Negative.   Eyes: Negative.   Respiratory: Negative.   Cardiovascular: Negative.   Gastrointestinal: Negative.   Musculoskeletal: Negative.   Skin: Negative.   Neurological: Negative.   Psychiatric/Behavioral: Positive for confusion, dysphoric mood and hallucinations.    Blood pressure (!) 127/51, pulse 61, temperature 98.3 F (36.8 C), temperature source Oral, resp. rate 15, height 5' 3" (1.6 m), weight 75.8 kg, SpO2 97 %.Body mass index is 29.58 kg/m.  General Appearance: Casual  Eye Contact:  Fair  Speech:  Clear and Coherent  Volume:  Normal  Mood:  Euthymic  Affect:  Congruent  Thought Process:  Goal Directed   Orientation:  Full (Time, Place, and Person)  Thought Content:  Hallucinations: Auditory  Suicidal Thoughts:  No  Homicidal Thoughts:  No  Memory:  Immediate;   Fair Recent;   Fair Remote;   Fair  Judgement:  Impaired  Insight:  Shallow  Psychomotor Activity:  Normal  Concentration:  Concentration: Fair  Recall:  [employer] of Knowledge:  Fair  Language:  Fair  Akathisia:  No  Handed:  Right  AIMS (if indicated):     Assets:  Desire for Improvement Housing Resilience Social Support  ADL's:  Intact  Cognition:  WNL  Sleep:        Treatment Plan Summary: Medication management and Plan We have had no success finding a geriatric psychiatry ward to accept this patient.  Decision had been made that she was appropriate for inpatient hospitalization.  Because of her medical stability and lack of dementia she is appropriate for admission to our unit.  Patient will be admitted to the psychiatric unit here.  15-minute checks.  Low-dose antipsychotic.  Labs all reviewed and ordered.  Disposition: Recommend psychiatric Inpatient admission when medically cleared. Supportive therapy provided about ongoing stressors.  Alethia Berthold, MD 07/11/2020 2:25 PM

## 2020-07-11 NOTE — Plan of Care (Signed)
  Problem: Education: Goal: Knowledge of [place] [facility] Education information/materials will improve Outcome: Progressing Goal: Emotional status will improve Outcome: Progressing Goal: Mental status will improve Outcome: Progressing Goal: Verbalization of understanding the information provided will improve Outcome: Progressing   Problem: Activity: Goal: Interest or engagement in activities will improve Outcome: Progressing Goal: Sleeping patterns will improve Outcome: Progressing   Problem: Coping: Goal: Ability to verbalize frustrations and anger appropriately will improve Outcome: Progressing Goal: Ability to demonstrate self-control will improve Outcome: Progressing   Problem: Health Behavior/Discharge Planning: Goal: Identification of resources available to assist in meeting health care needs will improve Outcome: Progressing Goal: Compliance with treatment plan for underlying cause of condition will improve Outcome: Progressing   Problem: Physical Regulation: Goal: Ability to maintain clinical measurements within normal limits will improve Outcome: Progressing   Problem: Safety: Goal: Periods of time without injury will increase Outcome: Progressing   

## 2020-07-11 NOTE — BH Assessment (Addendum)
Update: Per Charge Nurse Pamala Hurry pt is assigned to BMU room 302. Helene Kelp in patient access notified.

## 2020-07-11 NOTE — ED Notes (Signed)
Pt ambulated to restroom, NAD  Eating breakfast tray at this time

## 2020-07-11 NOTE — BH Assessment (Signed)
Requested information faxed to [facility] and confirmed it was received ([phone]).

## 2020-07-11 NOTE — ED Notes (Signed)
Pt ambulated to the bathroom.  

## 2020-07-11 NOTE — BH Assessment (Signed)
Patient is to be admitted to [facility] by Dr. Weber Cooks.  Attending Physician will be Dr. Domingo Cocking.   Patient has pending bed assignment. ER staff is aware of the admission:  Sherry Ruffing, ER Secretary    Joneen Caraway, Patient's Nurse   [place], Patient Access.

## 2020-07-11 NOTE — ED Notes (Signed)
Family at bedside. 

## 2020-07-11 NOTE — Tx Team (Signed)
Initial Treatment Plan 07/11/2020 11:15 PM [name] LUD:[id number]    PATIENT STRESSORS: Health problems   PATIENT STRENGTHS: Average or above average intelligence Communication skills General fund of knowledge Supportive family/friends   PATIENT IDENTIFIED PROBLEMS:     psychosis                 DISCHARGE CRITERIA:  Ability to meet basic life and health needs Adequate post-discharge living arrangements Improved stabilization in mood, thinking, and/or behavior Medical problems require only outpatient monitoring Motivation to continue treatment in a less acute level of care Need for constant or close observation no longer present Reduction of life-threatening or endangering symptoms to within safe limits Safe-care adequate arrangements made Verbal commitment to aftercare and medication compliance  PRELIMINARY DISCHARGE PLAN: Outpatient therapy Return to previous living arrangement  PATIENT/FAMILY INVOLVEMENT: This treatment plan has been presented to and reviewed with the patient, Stacey Boyd, and/or family member.  The patient and family have been given the opportunity to ask questions and make suggestions.  Libby Maw, RN 07/11/2020, 11:15 PM

## 2020-07-12 DIAGNOSIS — R440 Auditory hallucinations: Secondary | ICD-10-CM

## 2020-07-12 NOTE — Plan of Care (Signed)
°  Problem: Education: Goal: Knowledge of [place] [facility] Education information/materials will improve Outcome: Progressing Goal: Emotional status will improve Outcome: Progressing Goal: Mental status will improve Outcome: Not Progressing Goal: Verbalization of understanding the information provided will improve Outcome: Not Progressing   Problem: Activity: Goal: Interest or engagement in activities will improve Outcome: Not Progressing Goal: Sleeping patterns will improve Outcome: Progressing   Problem: Coping: Goal: Ability to verbalize frustrations and anger appropriately will improve Outcome: Progressing Goal: Ability to demonstrate self-control will improve Outcome: Progressing   Problem: Health Behavior/Discharge Planning: Goal: Compliance with treatment plan for underlying cause of condition will improve Outcome: Progressing   Problem: Safety: Goal: Periods of time without injury will increase Outcome: Progressing
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
## 2020-07-12 NOTE — [facility] Group Notes (Signed)
[facility] LCSW Group Therapy Note  Date/Time:  07/12/2020 1:14 PM-2:00 PM   Type of Therapy and Topic:  Group Therapy:  Healthy and Unhealthy Supports  Participation Level:  Minimal   Description of Group:  Patients in this group were introduced to the idea of adding a variety of healthy supports to address the various needs in their lives.Patients discussed what additional healthy supports could be helpful in their recovery and wellness after discharge in order to prevent future hospitalizations.   An emphasis was placed on using counselor, doctor, therapy groups, 12-step groups, and problem-specific support groups to expand supports.  They also worked as a group on developing a specific plan for several patients to deal with unhealthy supports through [place], psychoeducation with loved ones, and even termination of relationships.   Therapeutic Goals:   1)  discuss importance of adding supports to stay well once out of the hospital  2)  compare healthy versus unhealthy supports and identify some examples of each  3)  generate ideas and descriptions of healthy supports that can be added  4)  offer mutual support about how to address unhealthy supports  5)  encourage active participation in and adherence to discharge plan    Summary of Patient Progress:  Patient was quiet during group but participated in the group activity by listing 3 supports and how they are a benefit and a barrier.   Therapeutic Modalities:   Motivational Interviewing Brief Solution-Focused Therapy  Raina Mina, Latanya Presser 07/12/2020  3:05 PM

## 2020-07-12 NOTE — H&P (Signed)
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 Extraocular movements intact.  Cardiovascular:     Rate and Rhythm: Normal rate.  Pulmonary:     Effort: Pulmonary effort is normal. No respiratory distress.  Musculoskeletal:        General: Normal range of motion.     Cervical back: Normal range of motion.  Neurological:     General: No focal deficit present.     Mental Status: She is alert and oriented to person, place, and time.     Review of Systems  Constitutional: Negative.   Respiratory: Negative.   Cardiovascular: Negative.   Gastrointestinal: Negative.   Neurological: Negative for dizziness, tremors, weakness and headaches.  Psychiatric/Behavioral: Positive for confusion  (delusions). Negative for agitation, behavioral problems, decreased concentration, dysphoric mood, hallucinations,  self-injury, sleep disturbance and suicidal ideas. The patient is nervous/anxious.     Blood pressure (!) 147/56, pulse 79, temperature 97.7 F (36.5 C), temperature source Oral, resp. rate 18, height _0  (1.6 m), weight 75.8 kg, SpO2 100 %.Body mass index is 29.58 kg/m.  General Appearance: Casual and Fairly Groomed  Eye Contact:  Good  Speech:  Clear and Coherent and Normal Rate  Volume:  Normal  Mood:  Euthymic  Affect:  Appropriate and Congruent  Thought Process:  Coherent and Descriptions of Associations: Circumstantial  Orientation:  Full (Time, Place, and Person)  Thought Content:  Delusions and Hallucinations: Denies today  Suicidal Thoughts:  No  Homicidal Thoughts:  No  Memory:  Immediate;   Fair Recent;   Fair Remote;   Fair  Judgement:  Impaired  Insight:  Shallow  Psychomotor Activity:  Normal  Concentration:  Concentration: Good  Recall:  Good  Fund of Knowledge:  Good  Language:  Good  Akathisia:  No  Handed:  Right  AIMS (if indicated):     Assets:  Communication Skills Desire for Improvement Financial Resources/Insurance Resilience Social Support  ADL's:  Intact  Cognition:  Impaired,  Mild  Sleep:  Number of Hours: 7.5    Treatment Plan Summary: Daily contact with patient to assess and evaluate symptoms and progress in treatment and Medication management  Observation Level/Precautions:  15 minute checks  Laboratory:  Reviewed from emergency department and October 2021  Psychotherapy: Attend group therapy while on the unit  Medications: Continue home medications with start of Remeron, Zoloft, and Risperdal.  Will seek further clarification regarding ropinirole and timing related to delusions and hallucinations  Consultations: With hospitalist as needed  Discharge Concerns: Recurrent delusions causing housing concerns  Estimated LOS: 5-10  days  Other: Consider outpatient referral to sleep medicine for restless leg treatment and other underlying sleep disorders as indicated   Physician Treatment Plan for Primary Diagnosis: Psychosis ([place]) Long Term Goal(s): Improvement in symptoms so as ready for discharge  Short Term Goals: Ability to identify changes in lifestyle to reduce recurrence of condition will improve, Ability to verbalize feelings will improve, Ability to demonstrate self-control will improve, Ability to identify and develop effective coping behaviors will improve, Ability to maintain clinical measurements within normal limits will improve and Compliance with prescribed medications will improve  Physician Treatment Plan for Secondary Diagnosis: Principal Problem:   Psychosis ([place]) Active Problems:   Auditory hallucination  Long Term Goal(s): Improvement in symptoms so as ready for discharge  Short Term Goals: Ability to identify changes in lifestyle to reduce recurrence of condition will improve, Ability to verbalize feelings will improve, Ability to disclose and discuss suicidal ideas, Ability to demonstrate self-control will improve, Ability to identify and develop effective coping behaviors will improve, Ability to maintain clinical measurements within normal limits will improve and Compliance with prescribed medications will improve  I certify that inpatient services furnished can reasonably be expected to improve the patient's condition.    Lavella Hammock, MD 2/26/20229:23 AM
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
## 2020-07-12 NOTE — Progress Notes (Signed)
Patient Pleasant and cooperative. Continues to be paranoid and delusional. Reports she feels safe here on the unit as long as we are watching out for her. Denies any SI, HI, AVh. Medication compliant. Did complain of pain to right shoulder, tylenol given this am and lidocaine patch with partial relief. Patient did attend group. Pt reports feeling a little dizzy. Reports she is not sure what medications may be causing it. Writer encouraged patient to call for help prior to getting up if feeling dizzy. Pt verbalized understanding.  Encouragement and support provided. Safety checks maintained. Medications given as prescribed. Pt receptive and remains safe on unit with q 15 min checks.

## 2020-07-12 NOTE — Progress Notes (Signed)
Patient admitted after running out of her house into the woods in the middle of the night because she believes there are people in her attic who are trying to kill her. She is pleasant and cooperative. Arrived with a sling on her arm that she said she does not need. 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 Patient has a lidocaine patch on her shoulder that she says was applied shortly before she transferred. Skin search completed with Tiffany, MHT and patient does not appear to have any injuries. No contraband found. Patient denies SI, HI and AVH. She is relieved that no one can get onto the unit without a badge because she is afraid the people in her attic are coming here to try to attack her.

## 2020-07-13 DIAGNOSIS — E559 Vitamin D deficiency, unspecified: Secondary | ICD-10-CM

## 2020-07-13 DIAGNOSIS — M8580 Other specified disorders of bone density and structure, unspecified site: Secondary | ICD-10-CM

## 2020-07-13 DIAGNOSIS — N183 Chronic kidney disease, stage 3 unspecified: Secondary | ICD-10-CM

## 2020-07-13 DIAGNOSIS — G629 Polyneuropathy, unspecified: Secondary | ICD-10-CM

## 2020-07-13 DIAGNOSIS — F5101 Primary insomnia: Secondary | ICD-10-CM

## 2020-07-13 DIAGNOSIS — F22 Delusional disorders: Secondary | ICD-10-CM

## 2020-07-13 DIAGNOSIS — I10 Essential (primary) hypertension: Secondary | ICD-10-CM

## 2020-07-13 DIAGNOSIS — R300 Dysuria: Secondary | ICD-10-CM

## 2020-07-13 DIAGNOSIS — M1990 Unspecified osteoarthritis, unspecified site: Secondary | ICD-10-CM

## 2020-07-13 DIAGNOSIS — G2581 Restless legs syndrome: Secondary | ICD-10-CM

## 2020-07-13 DIAGNOSIS — E039 Hypothyroidism, unspecified: Secondary | ICD-10-CM

## 2020-07-13 DIAGNOSIS — Z794 Long term (current) use of insulin: Secondary | ICD-10-CM

## 2020-07-13 DIAGNOSIS — E1122 Type 2 diabetes mellitus with diabetic chronic kidney disease: Secondary | ICD-10-CM

## 2020-07-13 DIAGNOSIS — R42 Dizziness and giddiness: Secondary | ICD-10-CM

## 2020-07-13 LAB — URINALYSIS, ROUTINE W REFLEX MICROSCOPIC
Bilirubin Urine: NEGATIVE
Glucose, UA: NEGATIVE mg/dL
Hgb urine dipstick: NEGATIVE
Ketones, ur: NEGATIVE mg/dL
Leukocytes,Ua: NEGATIVE
Nitrite: NEGATIVE
Protein, ur: NEGATIVE mg/dL
Specific Gravity, Urine: 1.018 (ref 1.005–1.030)
pH: 5 (ref 5.0–8.0)

## 2020-07-13 MED ORDER — GABAPENTIN 300 MG PO CAPS
300.0000 mg | ORAL_CAPSULE | Freq: Every day | ORAL | Status: DC
  Administered 2020-07-13 – 2020-07-15 (×3): 300 mg via ORAL
  Filled 2020-07-13 (×3): qty 1

## 2020-07-13 MED ORDER — HYDROCHLOROTHIAZIDE 12.5 MG PO CAPS
12.5000 mg | ORAL_CAPSULE | Freq: Every day | ORAL | Status: DC
  Administered 2020-07-14 – 2020-07-16 (×3): 12.5 mg via ORAL
  Filled 2020-07-13 (×3): qty 1

## 2020-07-13 MED ORDER — TAB-A-VITE/IRON PO TABS
1.0000 | ORAL_TABLET | Freq: Every day | ORAL | Status: DC
  Administered 2020-07-13 – 2020-07-16 (×4): 1 via ORAL
  Filled 2020-07-13 (×5): qty 1

## 2020-07-13 MED ORDER — VITAMIN D3 25 MCG (1000 UNIT) PO TABS
5000.0000 [IU] | ORAL_TABLET | Freq: Every day | ORAL | Status: DC
  Administered 2020-07-13 – 2020-07-15 (×3): 5000 [IU] via ORAL
  Filled 2020-07-13 (×7): qty 5

## 2020-07-13 MED ORDER — LISINOPRIL 20 MG PO TABS
10.0000 mg | ORAL_TABLET | Freq: Every day | ORAL | Status: DC
  Administered 2020-07-14 – 2020-07-16 (×3): 10 mg via ORAL
  Filled 2020-07-13 (×3): qty 1

## 2020-07-13 MED ORDER — PROPRANOLOL HCL 20 MG PO TABS: 10.0000 mg | ORAL_TABLET | Freq: Two times a day (BID) | ORAL | Status: DC | PRN

## 2020-07-13 NOTE — BHH Group Notes (Signed)
LCSW Group Therapy 07/13/20: 1:20 PM- 2:15 PM   Type of Therapy and Topic:  Group Therapy:  Setting Goals   Participation Level:  Active   Description of Group: In this process group, patients discussed using strengths to work toward goals and address challenges.  Patients identified two positive things about themselves and one goal they were working on.  Patients were given the opportunity to share openly and support each other's plan for self-empowerment.  The group discussed the value of gratitude and were encouraged to have a daily reflection of positive characteristics or circumstances.  Patients were encouraged to identify a plan to utilize their strengths to work on current challenges and goals.   Therapeutic Goals 1. Patient will verbalize personal strengths/positive qualities and relate how these can assist with achieving desired personal goals 2. Patients will verbalize affirmation of peers plans for personal change and goal setting 3. Patients will explore the value of gratitude and positive focus as related to successful achievement of goals 4. Patients will verbalize a plan for regular reinforcement of personal positive qualities and circumstances.   Summary of Patient Progress: Patient could not identify any person strengths and stated she was still thinking about it. Patient stated that she would like to continue to help people in the future.        Therapeutic Modalities Cognitive Behavioral Therapy Motivational Interviewing

## 2020-07-13 NOTE — Progress Notes (Signed)
Patient pleasant and cooperative. Continues to be paranoid about someone outside of her window. Denies any SI, HI, AVH. Medication compliant. Complains of pain to lower back and abdominal area. MD notified, Urinalysis ordered and sent. Pt complains of dizziness with meds. Lisinopril held this am for low systolic rate. Pt visible in milieu, socializing appropriately with peers. Eating meals well. Pt reports lidocaine patch helps her shoulder. Encouragement and support provided. Safety checks maintained. Medications given as prescribed. Pt receptive and remains safe on unit with q 15 min checks.

## 2020-07-13 NOTE — Plan of Care (Signed)
  Problem: Education: Goal: Knowledge of [facility] Education information/materials will improve Outcome: Progressing Goal: Emotional status will improve Outcome: Progressing Goal: Mental status will improve Outcome: Progressing Goal: Verbalization of understanding the information provided will improve Outcome: Progressing   Problem: Activity: Goal: Interest or engagement in activities will improve Outcome: Progressing Goal: Sleeping patterns will improve Outcome: Progressing   Problem: Coping: Goal: Ability to verbalize frustrations and anger appropriately will improve Outcome: Progressing Goal: Ability to demonstrate self-control will improve Outcome: Progressing   Problem: Health Behavior/Discharge Planning: Goal: Identification of resources available to assist in meeting health care needs will improve Outcome: Progressing Goal: Compliance with treatment plan for underlying cause of condition will improve Outcome: Progressing   Problem: Physical Regulation: Goal: Ability to maintain clinical measurements within normal limits will improve Outcome: Progressing   Problem: Safety: Goal: Periods of time without injury will increase Outcome: Progressing   Problem: Activity: Goal: Will verbalize the importance of balancing activity with adequate rest periods Outcome: Progressing   Problem: Education: Goal: Will be free of psychotic symptoms Outcome: Progressing Goal: Knowledge of the prescribed therapeutic regimen will improve Outcome: Progressing   Problem: Coping: Goal: Coping ability will improve Outcome: Progressing Goal: Will verbalize feelings Outcome: Progressing   Problem: Health Behavior/Discharge Planning: Goal: Compliance with prescribed medication regimen will improve Outcome: Progressing   Problem: Nutritional: Goal: Ability to achieve adequate nutritional intake will improve Outcome: Progressing   Problem: Role Relationship: Goal:  Ability to communicate needs accurately will improve Outcome: Progressing Goal: Ability to interact with others will improve Outcome: Progressing   Problem: Safety: Goal: Ability to redirect hostility and anger into socially appropriate behaviors will improve Outcome: Progressing Goal: Ability to remain free from injury will improve Outcome: Progressing   Problem: Self-Care: Goal: Ability to participate in self-care as condition permits will improve Outcome: Progressing   Problem: Self-Concept: Goal: Will verbalize positive feelings about self Outcome: Progressing   

## 2020-07-13 NOTE — BHH Suicide Risk Assessment (Signed)
[facility] INPATIENT:  Family/Significant Other Suicide Prevention Education  Suicide Prevention Education:  Contact Attempts: Stacey Boyd, [phone] (Son) has been identified by the patient as the family member/significant other with whom the patient will be residing, and identified as the person(s) who will aid the patient in the event of a mental health crisis.  With written consent from the patient, two attempts were made to provide suicide prevention education, prior to and/or following the patient's discharge.  We were unsuccessful in providing suicide prevention education.  A suicide education pamphlet was given to the patient to share with family/significant other.  Date and time of first attempt: 07/13/20, 11:37 AM  Date and time of second attempt:  Stacey Boyd 07/13/2020, 11:37 AM

## 2020-07-13 NOTE — Progress Notes (Signed)
Patient continues to be paranoid. Asked if someone was outside of her window. Denies SI, HI. Says she hears people talking outside her window

## 2020-07-13 NOTE — BHH Suicide Risk Assessment (Signed)
[place] INPATIENT:  Family/Significant Other Suicide Prevention Education  Suicide Prevention Education:  Education Completed; Janene Harvey, (Son ) has been identified by the patient as the family member/significant other with whom the patient will be residing, and identified as the person(s) who will aid the patient in the event of a mental health crisis (suicidal ideations/suicide attempt).  With written consent from the patient, the family member/significant other has been provided the following suicide prevention education, prior to the and/or following the discharge of the patient.  The suicide prevention education provided includes the following:  Suicide risk factors  Suicide prevention and interventions  National Suicide Hotline telephone number  [facility] assessment telephone number  [facility] Emergency Assistance [place] and/or Residential Mobile Crisis Unit telephone number  Request made of family/significant other to:  Remove weapons (e.g., guns, rifles, knives), all items previously/currently identified as safety concern.    Remove drugs/medications (over-the-counter, prescriptions, illicit drugs), all items previously/currently identified as a safety concern.  The family member/significant other verbalizes understanding of the suicide prevention education information provided.  The family member/significant other agrees to remove the items of safety concern listed above.  Larene Beach  Mariena Meares 07/13/2020, 3:23 PM

## 2020-07-13 NOTE — Progress Notes (Signed)
[facility] MD Progress Note  07/13/2020 3:03 PM Stacey Boyd  MRN:  [id number]    Subjective: "I heard voices of 3 men coming to get me last night.  The staff was able to calm me down and keep me safe.  Patient is seen, chart is reviewed, overnight nursing staff reports that patient continues to be paranoid. Asked if someone was outside of her window.  Says she hears people talking outside her window.  Nurses report that patient has been complaining of urinary tract infection symptoms, and notes that prior to admission she was being treated for UTI, but believes she did not complete her antibiotic therapy.  Nurses also document that patient's blood pressure has been low requiring them to hold her lisinopril.  HCTZ and Inderal have been given.  Patient complaints of dizziness.  She has not had a fall while hospitalized.  On evaluation today, patient is sleepy on first approach.  She awakens with some prompting and is able to come to the office for assessment.  Patient reports a history of being diagnosed with restless leg syndrome 4-5 years ago.  She does not recall restless leg symptoms, but reports pain in her legs.  She recalls being on iron supplementation without improvement.  She states that she was started on ropinirole approximately 3 years ago.  She also reports auditory hallucinations starting approximately 3 years ago.  She believes that she has been on gabapentin 3 times daily for 1 year and no longer has pain in her legs.  She does however endorse feeling sleepy and fatigued over the past year.  She states that it is hard for her even to do her crocheting due to feeling sleepy.  She describes that she has been working with Dr. Brand MalesChancy Milroy to help decrease her polypharmacy.  She believes that she has been taking Zoloft and mirtazapine for 7-8 months, but denies symptoms of depression other than situationally due to the presence of auditory hallucinations.  She believes that if she was not having  auditory hallucinations she would also not feel depressed.  Patient again discusses her trauma from living at [place].  She does have appear to have a little insight today into recognizing delusions versus reality, however describes female auditory hallucinations being the women that she knew from [place]. Offered to call patient's son to discuss medication, which patient declines.  She states that he has told her to do what ever she needs to do in order to help prevent her auditory hallucinations.  Patient is agreeable to discontinuation of ropinirole as this may be contributing to hallucinations.  Will maximize gabapentin at bedtime instead of 3 times daily to help decrease her daytime sedation and ideally treat any leg pain that she has at night.  I have discussed with patient further titration of antidepressants could be completed by her outpatient psychiatrist should her other symptoms improve.  Patient states that she typically takes vitamin D and multiple vitamins at home, and requests to restart these today.  She is able to provide a urine sample to evaluate if there is still an active UTI.   Principal Problem: Psychosis ([place]) Diagnosis: Principal Problem:   Psychosis ([place]) Active Problems:   Essential hypertension, benign   OA (osteoarthritis)   Osteopenia   Vitamin D deficiency   Delusional disorder ([place])   Type 2 diabetes mellitus with stage 3 chronic kidney disease (HCC)   Peripheral polyneuropathy   Auditory hallucination   Primary insomnia   Acquired  hypothyroidism   Restless leg syndrome   Dysuria   Vertigo  Total Time spent with patient: 65 minutes  Past Psychiatric History: Patient has a past history of these kind of symptoms with a vague somewhat unclear diagnosis. The diagnosis of delusional disorder has been positive in the past mainly because she has these psychotic symptoms but without schizophrenia or a clear mood component. However, the  hallucinations are actually the primary issue rather than the delusions. Furthermore she does not appear to have a significant degree of dementia or any other neurologic problem to account for the hallucinations. She has a history of agitated behavior similar to what she is showing now in response to these symptoms. She also describes herself as having longstanding chronic anxiety. She has had prior hospitalization and medication trials   Past Medical History:  Past Medical History:  Diagnosis Date  . Allergy   . Anxiety   . Arthritis    legs  . Cancer ([place])    skin cancer on right shoulder.   . Depression   . Diabetes mellitus without complication ([place])   . Diverticulitis   . Dyspnea    with exertion  . Glaucoma   . Hyperlipidemia   . Hypertension    controlled on meds  . Hypothyroidism   . Liver cyst   . Osteoporosis   . Shoulder pain, left    and arm  . Thyroid disease   . Wrist fracture 2001   right    Past Surgical History:  Procedure Laterality Date  . ABDOMINAL HYSTERECTOMY  2005  . APPENDECTOMY  1980  . CARDIAC CATHETERIZATION Left 09/17/2015   Procedure: Left Heart Cath and Coronary Angiography;  Surgeon: Teodoro Spray, MD;  Location: [place] [facility];  Service: Cardiovascular;  Laterality: Left;  . CARPAL TUNNEL RELEASE    . CATARACT EXTRACTION W/PHACO Left 09/25/2019   Procedure: CATARACT EXTRACTION PHACO AND INTRAOCULAR LENS PLACEMENT (IOC) LEFT   4.45  00:36.1;  Surgeon: Birder Robson, MD;  Location: [facility];  Service: Ophthalmology;  Laterality: Left;  . CATARACT EXTRACTION W/PHACO Right 10/16/2019   Procedure: CATARACT EXTRACTION PHACO AND INTRAOCULAR LENS PLACEMENT (IOC)right  DIABETIC;  Surgeon: Birder Robson, MD;  Location: [place];  Service: Ophthalmology;  Laterality: Right;  5.29 0:33.7  . COLONOSCOPY WITH PROPOFOL N/A 11/07/2017   Procedure: COLONOSCOPY WITH PROPOFOL;  Surgeon: Jonathon Bellows, MD;  Location: [facility]  ENDOSCOPY;  Service: Gastroenterology;  Laterality: N/A;  . FOOT SURGERY Left    2 nodules removed  . FRACTURE SURGERY Right    wrist  . WRIST FRACTURE SURGERY Right    Family History:  Family History  Problem Relation Age of Onset  . Heart disease Mother        h/o rheumatic fever  . Heart disease Father   . Stroke Father   . Diabetes Sister   . Diabetes Brother   . Diabetes Sister   . Mental illness Other   . Colon cancer Neg Hx   . Breast cancer Neg Hx    Family Psychiatric  History: Denies Social History:  Social History   Substance and Sexual Activity  Alcohol Use No     Social History   Substance and Sexual Activity  Drug Use No    Social History   Socioeconomic History  . Marital status: Divorced    Spouse name: Not on file  . Number of children: 3  . Years of education: Not on file  .  Highest education level: 10th grade  Occupational History  . Not on file  Tobacco Use  . Smoking status: Former Smoker    Packs/day: 0.25    Years: 10.00    Pack years: 2.50    Types: Cigarettes    Quit date: 09/17/1979    Years since quitting: 40.8  . Smokeless tobacco: Never Used  Vaping Use  . Vaping Use: Never used  Substance and Sexual Activity  . Alcohol use: No  . Drug use: No  . Sexual activity: Yes    Partners: Male    Birth control/protection: None  Other Topics Concern  . Not on file  Social History Narrative   3 kids, local   Lives with daughter as of 2016   Divorced ~2002   Social Determinants of Health   Financial Resource Strain: Not on file  Food Insecurity: Not on file  Transportation Needs: Not on file  Physical Activity: Not on file  Stress: Not on file  Social Connections: Not on file   Additional Social History:             Currently living with her son while awaiting placement at [facility] living              Sleep: Good-with sedation during the day  Appetite:  Good  Current Medications: Current  Facility-Administered Medications  Medication Dose Route Frequency Provider Last Rate Last Admin  . acetaminophen (TYLENOL) tablet 650 mg  650 mg Oral Q6H PRN Clapacs, John T, MD      . acetaminophen (TYLENOL) tablet 650 mg  650 mg Oral Q6H PRN Clapacs, Madie Reno, MD   650 mg at 07/13/20 [phone]  . alum & mag hydroxide-simeth (MAALOX/MYLANTA) 200-200-20 MG/5ML suspension 30 mL  30 mL Oral Q4H PRN Clapacs, John T, MD      . atorvastatin (LIPITOR) tablet 10 mg  10 mg Oral Daily Clapacs, Madie Reno, MD   10 mg at 07/13/20 0813  . cholecalciferol (VITAMIN D) tablet 5,000 Units  5,000 Units Oral Daily Lavella Hammock, MD      . dorzolamide-timolol (COSOPT) 22.3-6.8 MG/ML ophthalmic solution 1 drop  1 drop Both Eyes BID Clapacs, Madie Reno, MD   1 drop at 07/13/20 0813  . gabapentin (NEURONTIN) capsule 300 mg  300 mg Oral QHS Lavella Hammock, MD      . glimepiride (AMARYL) tablet 1 mg  1 mg Oral Q lunch Clapacs, Madie Reno, MD   1 mg at 07/13/20 1203  . [START ON 07/14/2020] lisinopril (ZESTRIL) tablet 10 mg  10 mg Oral Daily Lavella Hammock, MD       And  . Derrill Memo ON 07/14/2020] hydrochlorothiazide (MICROZIDE) capsule 12.5 mg  12.5 mg Oral Daily Lavella Hammock, MD      . latanoprost (XALATAN) 0.005 % ophthalmic solution 1 drop  1 drop Both Eyes QHS Clapacs, Madie Reno, MD   1 drop at 07/12/20 2114  . lidocaine (LIDODERM) 5 % 1 patch  1 patch Transdermal Q24H Clapacs, Madie Reno, MD   1 patch at 07/13/20 1203  . magnesium hydroxide (MILK OF MAGNESIA) suspension 30 mL  30 mL Oral Daily PRN Clapacs, John T, MD      . mirtazapine (REMERON SOL-TAB) disintegrating tablet 45 mg  45 mg Oral QHS Clapacs, Madie Reno, MD   45 mg at 07/12/20 2115  . multivitamins with iron tablet 1 tablet  1 tablet Oral Daily Lavella Hammock, MD      .  propranolol (INDERAL) tablet 10 mg  10 mg Oral BID PRN Lavella Hammock, MD      . risperiDONE (RISPERDAL M-TABS) disintegrating tablet 1 mg  1 mg Oral QHS Clapacs, John T, MD   1 mg at 07/12/20 2115  .  sertraline (ZOLOFT) tablet 100 mg  100 mg Oral Daily Clapacs, Madie Reno, MD   100 mg at 07/13/20 0813  . traZODone (DESYREL) tablet 25-50 mg  25-50 mg Oral QHS PRN Clapacs, Madie Reno, MD        Lab Results:  Results for orders placed or performed during the hospital encounter of 07/11/20 (from the past 48 hour(s))  Urinalysis, Routine w reflex microscopic Urine, Clean Catch     Status: Abnormal   Collection Time: 07/13/20  8:51 AM  Result Value Ref Range   Color, Urine YELLOW (A) YELLOW   APPearance CLEAR (A) CLEAR   Specific Gravity, Urine 1.018 1.005 - 1.030   pH 5.0 5.0 - 8.0   Glucose, UA NEGATIVE NEGATIVE mg/dL   Hgb urine dipstick NEGATIVE NEGATIVE   Bilirubin Urine NEGATIVE NEGATIVE   Ketones, ur NEGATIVE NEGATIVE mg/dL   Protein, ur NEGATIVE NEGATIVE mg/dL   Nitrite NEGATIVE NEGATIVE   Leukocytes,Ua NEGATIVE NEGATIVE    Comment: Performed at [facility], [address] [place], [place] [address]    Blood Alcohol level:  Lab Results  Component Value Date   [facility] <10 07/11/2020   ETH <10 84/16/6063    Metabolic Disorder Labs: Lab Results  Component Value Date   HGBA1C 6.3 (A) 04/23/2020   MPG 134.11 10/10/2017   No results found for: PROLACTIN Lab Results  Component Value Date   CHOL 182 02/26/2020   TRIG 333 (H) 02/26/2020   HDL 34 (L) 02/26/2020   CHOLHDL 4.4 10/10/2017   VLDL 21 10/10/2017   LDLCALC 92 02/26/2020   LDLCALC 124 (H) 10/10/2017    Physical Findings: AIMS: Facial and Oral Movements Muscles of Facial Expression: None, normal Lips and Perioral Area: None, normal Jaw: None, normal Tongue: None, normal,Extremity Movements Upper (arms, wrists, hands, fingers): None, normal Lower (legs, knees, ankles, toes): None, normal, Trunk Movements Neck, shoulders, hips: None, normal, Overall Severity Severity of abnormal movements (highest score from questions above): None, normal Incapacitation due to abnormal movements: None, normal Patient's  awareness of abnormal movements (rate only patient's report): No Awareness, Dental Status Current problems with teeth and/or dentures?: No Does patient usually wear dentures?: No  CIWA:    COWS:     Musculoskeletal: Strength & Muscle Tone: decreased and With some pain and stiffness Gait & Station: normal Patient leans: N/A  Psychiatric Specialty Exam: Physical Exam Constitutional:      Appearance: Normal appearance.  HENT:     Head: Normocephalic and atraumatic.  Eyes:     Extraocular Movements: Extraocular movements intact.  Cardiovascular:     Rate and Rhythm: Normal rate.  Pulmonary:     Effort: Pulmonary effort is normal. No respiratory distress.  Musculoskeletal:     Cervical back: Normal range of motion.     Comments: Decreased, right shoulder injury  Neurological:     General: No focal deficit present.     Mental Status: She is alert and oriented to person, place, and time.     Review of Systems  Constitutional: Negative.   Respiratory: Negative.   Cardiovascular: Negative.   Musculoskeletal: Positive for arthralgias and myalgias.  Neurological: Negative.   Psychiatric/Behavioral: Positive for hallucinations (auditory). Negative for agitation,  behavioral problems, confusion, decreased concentration, dysphoric mood, self-injury, sleep disturbance and suicidal ideas. The patient is not nervous/anxious and is not hyperactive.     Blood pressure (!) 114/58, pulse 65, temperature 97.7 F (36.5 C), temperature source Oral, resp. rate 17, height 5\' 3"  (1.6 m), weight 75.8 kg, SpO2 97 %.Body mass index is 29.58 kg/m.  General Appearance: Casual and Fairly Groomed  Eye Contact:  Good  Speech:  Clear and Coherent and Normal Rate  Volume:  Normal  Mood:  Dysphoric  Affect:  Congruent  Thought Process:  Coherent and Descriptions of Associations: Circumstantial  Orientation:  Full (Time, Place, and Person)  Thought Content:  Delusions and Hallucinations: Auditory   Suicidal Thoughts:  No  Homicidal Thoughts:  No  Memory:  Immediate;   Good Recent;   Good Remote;   Good  Judgement:  Fair  Insight:  Fair  Psychomotor Activity:  Decreased  Concentration:  Concentration: Good and Attention Span: Good  Recall:  Good  Fund of Knowledge:  Good  Language:  Good  Akathisia:  No  Handed:  Right  AIMS (if indicated):     Assets:  Communication Skills Desire for Improvement Financial Resources/Insurance Resilience Social Support  ADL's:  Intact  Cognition:  WNL  Sleep:  Number of Hours: 8.15     Treatment Plan Summary: Daily contact with patient to assess and evaluate symptoms and progress in treatment and Medication management   Assessment:  Teyana Pierron is a 75 y.o. female with a history of delusions and auditory hallucinations for the past 3 years.  Of note, patient has been on ropinirole which is a dopaminergic agent for the past 3 years which may be contributing.  We will discontinue ropinirole and continue to monitor.   Diagnosis: Psychosis ([place])    Pertinent findings today: Patient continues to have auditory hallucinations last night  UA today negative for UTI    Treatment Plan Summary: Daily contact with patient to assess and evaluate symptoms and progress in treatment.   Scheduled medications/labs: -Zoloft 100 mg daily for depression and anxiety -Mirtazapine 45 mg at bedtime for depression -Risperdal M-Tab 1 mg daily at bedtime to prevent auditory hallucinations   Medical needs: -Lipitor 10 mg daily for hyperlipidemia -Vitamin D 5000 units daily for osteopenia -Cosopt 1 drop both eyes twice daily for glaucoma -Xalatan ophthalmic solution 1 drop at bedtime for glaucoma -Glimepiride 1 mg daily for diabetes -Change gabapentin from 100 mg 3 times daily to 300 mg at bedtime for leg pain, possible RLS -Start multiple vitamin with iron for RLS prevention -Lidoderm patch to right shoulder -Decrease Zestril/HCTZ's dose to  10/12.25 for hypertension -Change Inderal to 10 mg twice daily as needed for elevated blood pressure or anxiety    PRN's : -Trazodone 25-50 mg oral as needed for sleep -Tylenol 650 mg every 6 hours as needed for pain -As needed's for GI complaints       Psychosocial:   1. Encouragement to attend group therapies. 2. Encourage patient to learn and implement coping strategies to avoid need for PRN medications. 3. Encouragement for medication compliance. 4. Disposition in progress, appreciate social work assistance. 5.  Patient would benefit from family meeting with son prior to discharge.   Develop treatment plan to decrease risk of need for readmission.  Psycho-social education regarding self care.  Health care follow up as needed for medical problems.  Review, reconcile, and reinstate any pertinent home medications for other health issues where appropriate. Call for  consults with hospitalist for any additional specialty patient care services as needed   Lavella Hammock, MD 07/13/2020, 3:03 PM

## 2020-07-13 NOTE — Plan of Care (Signed)
  Problem: Education: Goal: Knowledge of  General Education information/materials will improve Outcome: Progressing Goal: Emotional status will improve Outcome: Progressing Goal: Mental status will improve Outcome: Not Progressing Goal: Verbalization of understanding the information provided will improve Outcome: Progressing   Problem: Activity: Goal: Interest or engagement in activities will improve Outcome: Progressing   Problem: Coping: Goal: Ability to verbalize frustrations and anger appropriately will improve Outcome: Progressing Goal: Ability to demonstrate self-control will improve Outcome: Progressing   Problem: Health Behavior/Discharge Planning: Goal: Compliance with treatment plan for underlying cause of condition will improve Outcome: Progressing   Problem: Safety: Goal: Periods of time without injury will increase Outcome: Progressing

## 2020-07-13 NOTE — Progress Notes (Signed)
Patient reportedly has lower back pain in the soft part below the ribs and radiates around to the lower abdomen. Writer asked about UTI with any burning symptoms. Pt reports she had a UTI prior to admission and did not complete her full course of antibiotics.

## 2020-07-14 ENCOUNTER — Telehealth: Payer: Self-pay

## 2020-07-14 DIAGNOSIS — F29 Unspecified psychosis not due to a substance or known physiological condition: Secondary | ICD-10-CM

## 2020-07-14 MED ORDER — RISPERIDONE 1 MG PO TBDP
2.0000 mg | ORAL_TABLET | Freq: Every day | ORAL | Status: DC
  Administered 2020-07-14 – 2020-07-15 (×2): 2 mg via ORAL
  Filled 2020-07-14 (×2): qty 2

## 2020-07-14 NOTE — Progress Notes (Addendum)
Recreation Therapy Notes   Date: 07/14/2020  Time: 9:30 am   Location: Craft room   Behavioral response: Appropriate  Intervention Topic: Self-esteem    Discussion/Intervention:  Group content today was focused on self-esteem. Patient defined self-esteem and where it comes form. The group described reasons self-esteem is important. Individuals stated things that impact self-esteem and positive ways to improve self-esteem. Patient went over the components of self-esteem. The group participated in the intervention where patients were able to use origami to identify positive ways to improve their self-esteem.  Clinical Observations/Feedback: Patient came to group and was focused on what peers and staff had to say about self-esteem. Individual was social with staff while participating in the intervention. [name] LRT/CTRS          Teressa Mcglocklin 07/14/2020 11:02 AM

## 2020-07-14 NOTE — Progress Notes (Signed)
Patient is calm and cooperative with assessment this morning. Her affect was appropriate and she was observed to be smiling and interacting appropriately with peers. Patient denied SI, HI, and AVH this morning. She stated she believes the medication is helping and she was able to sleep all night. She does not report any medication side effects. Patient reports some pain in her right arm/shoulder from a prior fall.  Patient remains safe on the unit at this time and q15 minute safety checks are maintained. Support and encouragement is provided.

## 2020-07-14 NOTE — Tx Team (Addendum)
Interdisciplinary Treatment and Diagnostic Plan Update  07/14/2020 Time of Session: 09:00AM Stacey Boyd MRN: [id number]  Principal Diagnosis: Psychosis [facility])  Secondary Diagnoses: Principal Problem:   Psychosis ([place]) Active Problems:   Essential hypertension, benign   OA (osteoarthritis)   Osteopenia   Vitamin D deficiency   Delusional disorder ([place])   Type 2 diabetes mellitus with stage 3 chronic kidney disease (HCC)   Peripheral polyneuropathy   Auditory hallucination   Primary insomnia   Acquired hypothyroidism   Restless leg syndrome   Dysuria   Vertigo   Current Medications:  Current Facility-Administered Medications  Medication Dose Route Frequency Provider Last Rate Last Admin  . acetaminophen (TYLENOL) tablet 650 mg  650 mg Oral Q6H PRN Clapacs, John T, MD      . acetaminophen (TYLENOL) tablet 650 mg  650 mg Oral Q6H PRN Clapacs, Madie Reno, MD   650 mg at 07/13/20 [phone]  . alum & mag hydroxide-simeth (MAALOX/MYLANTA) 200-200-20 MG/5ML suspension 30 mL  30 mL Oral Q4H PRN Clapacs, John T, MD      . atorvastatin (LIPITOR) tablet 10 mg  10 mg Oral Daily Clapacs, Madie Reno, MD   10 mg at 07/14/20 0827  . cholecalciferol (VITAMIN D) tablet 5,000 Units  5,000 Units Oral Daily Lavella Hammock, MD   5,000 Units at 07/14/20 [phone]  . dorzolamide-timolol (COSOPT) 22.3-6.8 MG/ML ophthalmic solution 1 drop  1 drop Both Eyes BID Clapacs, Madie Reno, MD   1 drop at 07/14/20 [phone]  . gabapentin (NEURONTIN) capsule 300 mg  300 mg Oral QHS Lavella Hammock, MD   300 mg at 07/13/20 2108  . glimepiride (AMARYL) tablet 1 mg  1 mg Oral Q lunch Clapacs, John T, MD   1 mg at 07/13/20 1203  . lisinopril (ZESTRIL) tablet 10 mg  10 mg Oral Daily Lavella Hammock, MD   10 mg at 07/14/20 3833   And  . hydrochlorothiazide (MICROZIDE) capsule 12.5 mg  12.5 mg Oral Daily Lavella Hammock, MD   12.5 mg at 07/14/20 3832  . latanoprost (XALATAN) 0.005 % ophthalmic solution 1 drop  1 drop Both Eyes QHS Clapacs,  Madie Reno, MD   1 drop at 07/13/20 2109  . lidocaine (LIDODERM) 5 % 1 patch  1 patch Transdermal Q24H Clapacs, Madie Reno, MD   1 patch at 07/13/20 1203  . magnesium hydroxide (MILK OF MAGNESIA) suspension 30 mL  30 mL Oral Daily PRN Clapacs, John T, MD      . mirtazapine (REMERON SOL-TAB) disintegrating tablet 45 mg  45 mg Oral QHS Clapacs, Madie Reno, MD   45 mg at 07/13/20 2109  . multivitamins with iron tablet 1 tablet  1 tablet Oral Daily Lavella Hammock, MD   1 tablet at 07/14/20 [phone]  . propranolol (INDERAL) tablet 10 mg  10 mg Oral BID PRN Lavella Hammock, MD      . risperiDONE (RISPERDAL M-TABS) disintegrating tablet 1 mg  1 mg Oral QHS Clapacs, Madie Reno, MD   1 mg at 07/13/20 2109  . sertraline (ZOLOFT) tablet 100 mg  100 mg Oral Daily Clapacs, Madie Reno, MD   100 mg at 07/14/20 6606  . traZODone (DESYREL) tablet 25-50 mg  25-50 mg Oral QHS PRN Clapacs, Madie Reno, MD       PTA Medications: Medications Prior to Admission  Medication Sig Dispense Refill Last Dose  . Acetaminophen (TYLENOL ARTHRITIS PAIN PO) Take 650-1,300 mg by mouth as needed.     Marland Kitchen  atorvastatin (LIPITOR) 10 MG tablet Take 1 tablet (10 mg total) by mouth daily. FOR CHOLESTEROL 90 tablet 3   . azelastine (ASTELIN) 0.1 % nasal spray Place 2 sprays into both nostrils 2 (two) times daily. Use in each nostril as directed 30 mL 1   . Cholecalciferol (VITAMIN D3) 5000 units TABS Take 5,000 Units by mouth daily.      . Dorzolamide HCl-Timolol Mal PF 2-0.5 % SOLN Apply 1 drop to eye 2 (two) times daily.     . fexofenadine (ALLEGRA) 180 MG tablet Take 180 mg by mouth daily.     Marland Kitchen gabapentin (NEURONTIN) 100 MG capsule Take 100 mg by mouth 3 (three) times daily. (Patient not taking: No sig reported)     . glimepiride (AMARYL) 1 MG tablet TAKE ONE TAB WITH LUNCH FOR DM (Patient taking differently: Take 1 mg by mouth daily with lunch. TAKE ONE TAB WITH LUNCH FOR DM) 90 tablet 3   . latanoprost (XALATAN) 0.005 % ophthalmic solution Place 1 drop into  both eyes at bedtime.     Marland Kitchen lisinopril-hydrochlorothiazide (ZESTORETIC) 20-12.5 MG tablet Take 2 tablets by mouth daily.     . meclizine (ANTIVERT) 12.5 MG tablet Take 1 tablet by mouth 2 (two) times daily as needed. (Patient not taking: No sig reported)     . Melatonin 10 MG TABS Take 20 mg by mouth as needed.      . mirtazapine (REMERON SOL-TAB) 45 MG disintegrating tablet Take 1 tablet (45 mg total) by mouth at bedtime. 30 tablet 2   . Multiple Vitamins-Minerals (SENTRY SENIOR) TABS Take 1 tablet by mouth daily. Eye vitamin     . nitrofurantoin, macrocrystal-monohydrate, (MACROBID) 100 MG capsule Use as instructed (Patient taking differently: Take 100 mg by mouth 2 (two) times daily. Use as instructed) 20 capsule 0   . propranolol (INDERAL) 10 MG tablet Take 1 tablet (10 mg total) by mouth 2 (two) times daily. (Patient not taking: No sig reported) 60 tablet 2   . rOPINIRole (REQUIP) 2 MG tablet Take 2 mg by mouth at bedtime.     . sertraline (ZOLOFT) 100 MG tablet Take 1 tablet (100 mg total) by mouth daily. 30 tablet 3   . traZODone (DESYREL) 50 MG tablet Take 0.5-1 tablets (25-50 mg total) by mouth at bedtime as needed for sleep. 30 tablet 3   . valACYclovir (VALTREX) 1000 MG tablet Take 1 tablet (1,000 mg total) by mouth as directed. Take 2 by mouth at onset then 2 by mouth 12 hours later. 30 tablet 0     Patient Stressors: Health problems  Patient Strengths: Average or above average intelligence Communication skills General fund of knowledge Supportive family/friends  Treatment Modalities: Medication Management, Group therapy, Case management,  1 to 1 session with clinician, Psychoeducation, Recreational therapy.   Physician Treatment Plan for Primary Diagnosis: Psychosis ([place]) Long Term Goal(s): Improvement in symptoms so as ready for discharge Improvement in symptoms so as ready for discharge   Short Term Goals: Ability to identify changes in lifestyle to reduce recurrence of  condition will improve Ability to verbalize feelings will improve Ability to demonstrate self-control will improve Ability to identify and develop effective coping behaviors will improve Ability to maintain clinical measurements within normal limits will improve Compliance with prescribed medications will improve Ability to identify changes in lifestyle to reduce recurrence of condition will improve Ability to verbalize feelings will improve Ability to disclose and discuss suicidal ideas Ability to demonstrate self-control will improve  Ability to identify and develop effective coping behaviors will improve Ability to maintain clinical measurements within normal limits will improve Compliance with prescribed medications will improve  Medication Management: Evaluate patient's response, side effects, and tolerance of medication regimen.  Therapeutic Interventions: 1 to 1 sessions, Unit Group sessions and Medication administration.  Evaluation of Outcomes: Not Met  Physician Treatment Plan for Secondary Diagnosis: Principal Problem:   Psychosis ([place]) Active Problems:   Essential hypertension, benign   OA (osteoarthritis)   Osteopenia   Vitamin D deficiency   Delusional disorder ([place])   Type 2 diabetes mellitus with stage 3 chronic kidney disease (HCC)   Peripheral polyneuropathy   Auditory hallucination   Primary insomnia   Acquired hypothyroidism   Restless leg syndrome   Dysuria   Vertigo  Long Term Goal(s): Improvement in symptoms so as ready for discharge Improvement in symptoms so as ready for discharge   Short Term Goals: Ability to identify changes in lifestyle to reduce recurrence of condition will improve Ability to verbalize feelings will improve Ability to demonstrate self-control will improve Ability to identify and develop effective coping behaviors will improve Ability to maintain clinical measurements within normal limits will improve Compliance with prescribed  medications will improve Ability to identify changes in lifestyle to reduce recurrence of condition will improve Ability to verbalize feelings will improve Ability to disclose and discuss suicidal ideas Ability to demonstrate self-control will improve Ability to identify and develop effective coping behaviors will improve Ability to maintain clinical measurements within normal limits will improve Compliance with prescribed medications will improve     Medication Management: Evaluate patient's response, side effects, and tolerance of medication regimen.  Therapeutic Interventions: 1 to 1 sessions, Unit Group sessions and Medication administration.  Evaluation of Outcomes: Not Met   RN Treatment Plan for Primary Diagnosis: Psychosis ([place]) Long Term Goal(s): Knowledge of disease and therapeutic regimen to maintain health will improve  Short Term Goals: Ability to remain free from injury will improve, Ability to participate in decision making will improve, Ability to verbalize feelings will improve, Ability to identify and develop effective coping behaviors will improve and Compliance with prescribed medications will improve  Medication Management: RN will administer medications as ordered by provider, will assess and evaluate patient's response and provide education to patient for prescribed medication. RN will report any adverse and/or side effects to prescribing provider.  Therapeutic Interventions: 1 on 1 counseling sessions, Psychoeducation, Medication administration, Evaluate responses to treatment, Monitor vital signs and CBGs as ordered, Perform/monitor CIWA, COWS, AIMS and Fall Risk screenings as ordered, Perform wound care treatments as ordered.  Evaluation of Outcomes: Not Met   LCSW Treatment Plan for Primary Diagnosis: Psychosis ([place]) Long Term Goal(s): Safe transition to appropriate next level of care at discharge, Engage patient in therapeutic group addressing interpersonal  concerns.  Short Term Goals: Engage patient in aftercare planning with referrals and resources, Increase social support, Increase emotional regulation, Identify triggers associated with mental health/substance abuse issues and Increase skills for wellness and recovery  Therapeutic Interventions: Assess for all discharge needs, 1 to 1 time with Social worker, Explore available resources and support systems, Assess for adequacy in community support network, Educate family and significant other(s) on suicide prevention, Complete Psychosocial Assessment, Interpersonal group therapy.  Evaluation of Outcomes: Not Met   Progress in Treatment: Attending groups: Yes. Participating in groups: Yes. Taking medication as prescribed: Yes. Toleration medication: Yes. Family/Significant other contact made: Yes, individual(s) contacted:  Janene Harvey, son Patient understands diagnosis:  Yes. Discussing patient identified problems/goals with staff: Yes. Medical problems stabilized or resolved: Yes. Denies suicidal/homicidal ideation: Yes. Issues/concerns per patient self-inventory: No. Other:   New problem(s) identified: No, Describe:  No changes at this time  New Short Term/Long Term Goal(s): Ability to remain free from injury will improve, Ability to participate in decision making will improve, Ability to verbalize feelings will improve, Ability to identify and develop effective coping behaviors will improve and Compliance with prescribed medications will improve  Patient Goals:  "I had a trauma and it triggers me when I hear cussing and fussing." Pt stated that she wants helps to reduce trauma and reduce flashbacks.  Discharge Plan or Barriers: CSW will assist pt in obtaining transportation home when pt is adequate for discharge. Pt states that she has insurance and CSW will assist pt in finding follow-up care.  Reason for Continuation of Hospitalization: Anxiety Hallucinations Medical  Issues Medication stabilization  Estimated Length of Stay: 1- 7 days  Recreational Therapy: Patient Stressors: N/A Patient Goal: Patient will engage in groups without prompting or encouragement from LRT x3 group sessions within 5 recreation therapy group sessions.  Attendees: Patient: Chyrl Elwell. [place] 07/14/2020 9:13 AM  Physician: Salley Scarlet, MD 07/14/2020 9:13 AM  Nursing:  07/14/2020 9:13 AM  RN Care Manager: Marla Roe, RN  07/14/2020 9:13 AM  Social Worker: Assunta Curtis, MSW, LCSW 07/14/2020 9:13 AM  Recreational Therapist: Roanna Epley, Reather Converse, LRT  07/14/2020 9:13 AM  Other: Kiva Martinique, MSW, [place] 07/14/2020 9:13 AM  Other:  07/14/2020 9:13 AM  Other: 07/14/2020 9:13 AM    Scribe for Treatment Team: Kiva A Martinique, [place] 07/14/2020 9:13 AM

## 2020-07-14 NOTE — Progress Notes (Signed)
Recreation Therapy Notes  INPATIENT RECREATION THERAPY ASSESSMENT  Patient Details Name: Stacey Boyd MRN: [id number] DOB: 1946-01-02 Today's Date: 07/14/2020       Information Obtained From: Patient  Able to Participate in Assessment/Interview: Yes  Patient Presentation: Responsive  Reason for Admission (Per Patient): Active Symptoms  Patient Stressors:    Coping Skills:   Isolation,Talk,Avoidance  Leisure Interests (2+):  Crafts - Knitting/Crocheting,Crafts - Sewing,Crafts - Quilting,Exercise - Walking,Social - Friends Lacinda Axon)  Frequency of Recreation/Participation: Monthly  Awareness of Community Resources:  Yes  Community Resources:  Stacey Boyd  Current Use:    If no, Barriers?:    Expressed Interest in Stacey Boyd Information:    [employer] of Residence:  Insurance underwriter  Patient Main Form of Transportation: Musician  Patient Strengths:  Caring, polite, friendly  Patient Identified Areas of Improvement:  Get my mind straight  Patient Goal for Hospitalization:  Get the voices out of my head  Current SI (including self-harm):  No  Current HI:  No  Current AVH: No  Staff Intervention Plan: Collaborate with Interdisciplinary Treatment Team,Group Attendance  Consent to Intern Participation: N/A  Stacey Boyd 07/14/2020, 2:30 PM

## 2020-07-14 NOTE — Plan of Care (Signed)
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
## 2020-07-14 NOTE — BHH Group Notes (Signed)
[facility] Group Notes:  (Nursing/MHT/Case Management/Adjunct)  Date:  07/14/2020  Time:  8:53 PM  Type of Therapy:  Group Therapy  Participation Level:  Active  Participation Quality:  Appropriate  Affect:  Appropriate  Cognitive:  Alert  Insight:  Good  Engagement in Group:  Engaged and goal was to learn to adjust.  Modes of Intervention:  Support  Summary of Progress/Problems:  Nehemiah Settle 07/14/2020, 8:53 PM

## 2020-07-14 NOTE — BHH Group Notes (Signed)
LCSW Group Therapy Note   07/14/2020 2:14 PM  Type of Therapy and Topic:  Group Therapy:  Overcoming Obstacles   Participation Level:  Did Not Attend   Description of Group:    In this group patients will be encouraged to explore what they see as obstacles to their own wellness and recovery. They will be guided to discuss their thoughts, feelings, and behaviors related to these obstacles. The group will process together ways to cope with barriers, with attention given to specific choices patients can make. Each patient will be challenged to identify changes they are motivated to make in order to overcome their obstacles. This group will be process-oriented, with patients participating in exploration of their own experiences as well as giving and receiving support and challenge from other group members.   Therapeutic Goals: 1. Patient will identify personal and current obstacles as they relate to admission. 2. Patient will identify barriers that currently interfere with their wellness or overcoming obstacles.  3. Patient will identify feelings, thought process and behaviors related to these barriers. 4. Patient will identify two changes they are willing to make to overcome these obstacles:      Summary of Patient Progress X   Therapeutic Modalities:   Cognitive Behavioral Therapy Solution Focused Therapy Motivational Interviewing Relapse Prevention Therapy  Assunta Curtis, MSW, LCSW 07/14/2020 2:14 PM

## 2020-07-14 NOTE — Progress Notes (Signed)
Patient is pleasant and cooperative. Denies any SI, HI, AVH. Reports feels wonderful but she is not ready to go home yet. Pt reports med change helped with her restless legs. Pt visible in milieu first part of shift talking and socializing with peers. Complaints of pain to right arm, tylenol requested and given as well as prn for sleep with good relief.  Encouragement and support provided. Safety checks maintained. Medications given as prescribed. Pt receptive and remains safe on unit with q 15 min checks.

## 2020-07-14 NOTE — Progress Notes (Signed)
Patient has been out of room more. Calm and cooperative. Denies SI, HI and AVH

## 2020-07-14 NOTE — Plan of Care (Signed)
  Problem: Education: Goal: Knowledge of [facility] Education information/materials will improve Outcome: Progressing Goal: Emotional status will improve Outcome: Progressing Goal: Mental status will improve Outcome: Progressing Goal: Verbalization of understanding the information provided will improve Outcome: Progressing   Problem: Activity: Goal: Interest or engagement in activities will improve Outcome: Progressing Goal: Sleeping patterns will improve Outcome: Progressing   Problem: Coping: Goal: Ability to verbalize frustrations and anger appropriately will improve Outcome: Progressing Goal: Ability to demonstrate self-control will improve Outcome: Progressing   Problem: Health Behavior/Discharge Planning: Goal: Identification of resources available to assist in meeting health care needs will improve Outcome: Progressing Goal: Compliance with treatment plan for underlying cause of condition will improve Outcome: Progressing   Problem: Physical Regulation: Goal: Ability to maintain clinical measurements within normal limits will improve Outcome: Progressing   Problem: Safety: Goal: Periods of time without injury will increase Outcome: Progressing   Problem: Activity: Goal: Will verbalize the importance of balancing activity with adequate rest periods Outcome: Progressing   Problem: Education: Goal: Will be free of psychotic symptoms Outcome: Progressing Goal: Knowledge of the prescribed therapeutic regimen will improve Outcome: Progressing   Problem: Coping: Goal: Coping ability will improve Outcome: Progressing Goal: Will verbalize feelings Outcome: Progressing   Problem: Health Behavior/Discharge Planning: Goal: Compliance with prescribed medication regimen will improve Outcome: Progressing   Problem: Nutritional: Goal: Ability to achieve adequate nutritional intake will improve Outcome: Progressing   Problem: Role Relationship: Goal:  Ability to communicate needs accurately will improve Outcome: Progressing Goal: Ability to interact with others will improve Outcome: Progressing   Problem: Safety: Goal: Ability to redirect hostility and anger into socially appropriate behaviors will improve Outcome: Progressing Goal: Ability to remain free from injury will improve Outcome: Progressing   Problem: Self-Care: Goal: Ability to participate in self-care as condition permits will improve Outcome: Progressing   Problem: Self-Concept: Goal: Will verbalize positive feelings about self Outcome: Progressing   

## 2020-07-14 NOTE — Telephone Encounter (Signed)
Returned call, LMOM.

## 2020-07-14 NOTE — Progress Notes (Signed)
Recreation Therapy Notes  INPATIENT RECREATION TR PLAN  Patient Details Name: Stacey Boyd MRN: [id number] DOB: 1945/07/02 Today's Date: 07/14/2020  Rec Therapy Plan Is patient appropriate for Therapeutic Recreation?: Yes Treatment times per week: at least 3 Estimated Length of Stay: 5-7 Days TR Treatment/Interventions: Group participation (Comment)  Discharge Criteria Pt will be discharged from therapy if:: Discharged Treatment plan/goals/alternatives discussed and agreed upon by:: Patient/family  Discharge Summary     [name] 07/14/2020, 2:33 PM

## 2020-07-15 NOTE — BHH Group Notes (Signed)
LCSW Group Therapy Note  07/15/2020 2:13 PM  Type of Therapy/Topic:  Group Therapy:  Balance in Life  Participation Level:  Active  Description of Group:    This group will address the concept of balance and how it feels and looks when one is unbalanced. Patients will be encouraged to process areas in their lives that are out of balance and identify reasons for remaining unbalanced. Facilitators will guide patients in utilizing problem-solving interventions to address and correct the stressor making their life unbalanced. Understanding and applying boundaries will be explored and addressed for obtaining and maintaining a balanced life. Patients will be encouraged to explore ways to assertively make their unbalanced needs known to significant others in their lives, using other group members and facilitator for support and feedback.  Therapeutic Goals: 1. Patient will identify two or more emotions or situations they have that consume much of in their lives. 2. Patient will identify signs/triggers that life has become out of balance:  3. Patient will identify two ways to set boundaries in order to achieve balance in their lives:  4. Patient will demonstrate ability to communicate their needs through discussion and/or role plays  Summary of Patient Progress: Patient was present in group. Patient was pleasant and cooperative.  Patient was supportive of other group members.  Patient shared that she enjoys quilting and crafts for de-escalation.  Patient reports that she likes to listen to gospel music as well.   Therapeutic Modalities:   Cognitive Behavioral Therapy Solution-Focused Therapy Assertiveness Training  Assunta Curtis MSW, LCSW 07/15/2020 2:13 PM

## 2020-07-15 NOTE — Progress Notes (Signed)
[facility] MD Progress Note  07/15/2020 1:29 PM Stacey Boyd  MRN:  [id number]   CC "I feel wonderful today" Subjective:  Patient is seen, chart is reviewed, overnight nursing staff reports no overnight events, medication compliant, and attending to ADLs.   Patient seen one-on-one today. She notes that she slept well overnight, and denied any auditory hallucinations or feelings that someone was trying to enter her room or harm her. She has somewhat improved insight today, and notes that everything may have been in her head. She recognizes that she has not harmed anyone, and it would be very unlikely that someone would want to follow her from place to place to harm her. She is hopeful that this new medication will keep those thoughts from occurring. She denies suicidal ideations, homicidal ideations, visual hallucinations, or auditory hallucinations. She notes some mild arthralgia pains, but has been able to ambulate the unit. She has enjoyed our group therapy sessions, and hopes to pursue outpatient group therapy upon discharge. She would like to follow-up with [facility], and social work team is working to determine if [place] will accept her insurance.   Contacted patient's son, Stacey Boyd, [phone]. Updated on patient progress and medication changes. He will be available to pick her up at [facility] tomorrow.    Principal Problem: Psychosis ([place]) Diagnosis: Principal Problem:   Psychosis ([place]) Active Problems:   Essential hypertension, benign   OA (osteoarthritis)   Osteopenia   Vitamin D deficiency   Delusional disorder ([place])   Type 2 diabetes mellitus with stage 3 chronic kidney disease (HCC)   Peripheral polyneuropathy   Auditory hallucination   Primary insomnia   Acquired hypothyroidism   Restless leg syndrome   Dysuria   Vertigo  Total Time spent with patient: 35 minutes  Past Psychiatric History: See H&P   Past Medical History:  Past Medical History:  Diagnosis Date  . Allergy   . Anxiety    . Arthritis    legs  . Cancer ([place])    skin cancer on right shoulder.   . Depression   . Diabetes mellitus without complication ([place])   . Diverticulitis   . Dyspnea    with exertion  . Glaucoma   . Hyperlipidemia   . Hypertension    controlled on meds  . Hypothyroidism   . Liver cyst   . Osteoporosis   . Shoulder pain, left    and arm  . Thyroid disease   . Wrist fracture 2001   right    Past Surgical History:  Procedure Laterality Date  . ABDOMINAL HYSTERECTOMY  2005  . APPENDECTOMY  1980  . CARDIAC CATHETERIZATION Left 09/17/2015   Procedure: Left Heart Cath and Coronary Angiography;  Surgeon: Teodoro Spray, MD;  Location: [place] [facility];  Service: Cardiovascular;  Laterality: Left;  . CARPAL TUNNEL RELEASE    . CATARACT EXTRACTION W/PHACO Left 09/25/2019   Procedure: CATARACT EXTRACTION PHACO AND INTRAOCULAR LENS PLACEMENT (IOC) LEFT   4.45  00:36.1;  Surgeon: Birder Robson, MD;  Location: [place];  Service: Ophthalmology;  Laterality: Left;  . CATARACT EXTRACTION W/PHACO Right 10/16/2019   Procedure: CATARACT EXTRACTION PHACO AND INTRAOCULAR LENS PLACEMENT (IOC)right  DIABETIC;  Surgeon: Birder Robson, MD;  Location: [place];  Service: Ophthalmology;  Laterality: Right;  5.29 0:33.7  . COLONOSCOPY WITH PROPOFOL N/A 11/07/2017   Procedure: COLONOSCOPY WITH PROPOFOL;  Surgeon: Jonathon Bellows, MD;  Location[facility];  Service: Gastroenterology;  Laterality: N/A;  . FOOT SURGERY  Left    2 nodules removed  . FRACTURE SURGERY Right    wrist  . WRIST FRACTURE SURGERY Right    Family History:  Family History  Problem Relation Age of Onset  . Heart disease Mother        h/o rheumatic fever  . Heart disease Father   . Stroke Father   . Diabetes Sister   . Diabetes Brother   . Diabetes Sister   . Mental illness Other   . Colon cancer Neg Hx   . Breast cancer Neg Hx    Family Psychiatric  History: Denies Social History:  Social  History   Substance and Sexual Activity  Alcohol Use No     Social History   Substance and Sexual Activity  Drug Use No    Social History   Socioeconomic History  . Marital status: Divorced    Spouse name: Not on file  . Number of children: 3  . Years of education: Not on file  . Highest education level: 10th grade  Occupational History  . Not on file  Tobacco Use  . Smoking status: Former Smoker    Packs/day: 0.25    Years: 10.00    Pack years: 2.50    Types: Cigarettes    Quit date: 09/17/1979    Years since quitting: 40.8  . Smokeless tobacco: Never Used  Vaping Use  . Vaping Use: Never used  Substance and Sexual Activity  . Alcohol use: No  . Drug use: No  . Sexual activity: Yes    Partners: Male    Birth control/protection: None  Other Topics Concern  . Not on file  Social History Narrative   3 kids, local   Lives with daughter as of 2016   Divorced ~2002   Social Determinants of Health   Financial Resource Strain: Not on file  Food Insecurity: Not on file  Transportation Needs: Not on file  Physical Activity: Not on file  Stress: Not on file  Social Connections: Not on file   Additional Social History:             Currently living with her son while awaiting placement at [facility] living              Sleep: Good  Appetite:  Good  Current Medications: Current Facility-Administered Medications  Medication Dose Route Frequency Provider Last Rate Last Admin  . acetaminophen (TYLENOL) tablet 650 mg  650 mg Oral Q6H PRN Clapacs, Madie Reno, MD   650 mg at 07/14/20 2101  . acetaminophen (TYLENOL) tablet 650 mg  650 mg Oral Q6H PRN Clapacs, Madie Reno, MD   650 mg at 07/13/20 9528  . alum & mag hydroxide-simeth (MAALOX/MYLANTA) 200-200-20 MG/5ML suspension 30 mL  30 mL Oral Q4H PRN Clapacs, John T, MD      . atorvastatin (LIPITOR) tablet 10 mg  10 mg Oral Daily Clapacs, Madie Reno, MD   10 mg at 07/15/20 0759  . cholecalciferol (VITAMIN D) tablet  5,000 Units  5,000 Units Oral Daily Lavella Hammock, MD   5,000 Units at 07/15/20 0800  . dorzolamide-timolol (COSOPT) 22.3-6.8 MG/ML ophthalmic solution 1 drop  1 drop Both Eyes BID Clapacs, Madie Reno, MD   1 drop at 07/15/20 0804  . gabapentin (NEURONTIN) capsule 300 mg  300 mg Oral QHS Lavella Hammock, MD   300 mg at 07/14/20 2058  . glimepiride (AMARYL) tablet 1 mg  1 mg Oral Q lunch Clapacs,  Madie Reno, MD   1 mg at 07/15/20 1109  . lisinopril (ZESTRIL) tablet 10 mg  10 mg Oral Daily Lavella Hammock, MD   10 mg at 07/15/20 1324   And  . hydrochlorothiazide (MICROZIDE) capsule 12.5 mg  12.5 mg Oral Daily Lavella Hammock, MD   12.5 mg at 07/15/20 0759  . latanoprost (XALATAN) 0.005 % ophthalmic solution 1 drop  1 drop Both Eyes QHS Clapacs, Madie Reno, MD   1 drop at 07/14/20 2217  . lidocaine (LIDODERM) 5 % 1 patch  1 patch Transdermal Q24H Clapacs, Madie Reno, MD   1 patch at 07/15/20 1108  . magnesium hydroxide (MILK OF MAGNESIA) suspension 30 mL  30 mL Oral Daily PRN Clapacs, John T, MD      . mirtazapine (REMERON SOL-TAB) disintegrating tablet 45 mg  45 mg Oral QHS Clapacs, Madie Reno, MD   45 mg at 07/14/20 2059  . multivitamins with iron tablet 1 tablet  1 tablet Oral Daily Lavella Hammock, MD   1 tablet at 07/15/20 0800  . propranolol (INDERAL) tablet 10 mg  10 mg Oral BID PRN Lavella Hammock, MD      . risperiDONE (RISPERDAL M-TABS) disintegrating tablet 2 mg  2 mg Oral QHS Salley Scarlet, MD   2 mg at 07/14/20 2057  . sertraline (ZOLOFT) tablet 100 mg  100 mg Oral Daily Clapacs, Madie Reno, MD   100 mg at 07/15/20 0759  . traZODone (DESYREL) tablet 25-50 mg  25-50 mg Oral QHS PRN Clapacs, Madie Reno, MD   50 mg at 07/14/20 2058    Lab Results:  No results found for this or any previous visit (from the past 57 hour(s)).  Blood Alcohol level:  Lab Results  Component Value Date   ETH <10 07/11/2020   ETH <10 40/02/2724    Metabolic Disorder Labs: Lab Results  Component Value Date   HGBA1C 6.3  (A) 04/23/2020   MPG 134.11 10/10/2017   No results found for: PROLACTIN Lab Results  Component Value Date   CHOL 182 02/26/2020   TRIG 333 (H) 02/26/2020   HDL 34 (L) 02/26/2020   CHOLHDL 4.4 10/10/2017   VLDL 21 10/10/2017   LDLCALC 92 02/26/2020   LDLCALC 124 (H) 10/10/2017    Physical Findings: AIMS: Facial and Oral Movements Muscles of Facial Expression: None, normal Lips and Perioral Area: None, normal Jaw: None, normal Tongue: None, normal,Extremity Movements Upper (arms, wrists, hands, fingers): None, normal Lower (legs, knees, ankles, toes): None, normal, Trunk Movements Neck, shoulders, hips: None, normal, Overall Severity Severity of abnormal movements (highest score from questions above): None, normal Incapacitation due to abnormal movements: None, normal Patient's awareness of abnormal movements (rate only patient's report): No Awareness, Dental Status Current problems with teeth and/or dentures?: No Does patient usually wear dentures?: No  CIWA:    COWS:     Musculoskeletal: Strength & Muscle Tone: decreased and With some pain and stiffness Gait & Station: normal Patient leans: N/A  Psychiatric Specialty Exam: Physical Exam Constitutional:      Appearance: Normal appearance.  HENT:     Head: Normocephalic and atraumatic.  Eyes:     Extraocular Movements: Extraocular movements intact.  Cardiovascular:     Rate and Rhythm: Normal rate.  Pulmonary:     Effort: Pulmonary effort is normal. No respiratory distress.  Musculoskeletal:     Cervical back: Normal range of motion.     Comments: Decreased, right shoulder injury  Neurological:     General: No focal deficit present.     Mental Status: She is alert and oriented to person, place, and time.     Review of Systems  Constitutional: Negative.   Respiratory: Negative.   Cardiovascular: Negative.   Musculoskeletal: Positive for arthralgias and myalgias.  Neurological: Negative.    Psychiatric/Behavioral: Negative for agitation, behavioral problems, confusion, decreased concentration, dysphoric mood, hallucinations (auditory), self-injury, sleep disturbance and suicidal ideas. The patient is not nervous/anxious and is not hyperactive.     Blood pressure (!) 119/106, pulse 70, temperature 97.7 F (36.5 C), temperature source Oral, resp. rate 18, height 5\' 3"  (1.6 m), weight 75.8 kg, SpO2 100 %.Body mass index is 29.58 kg/m.  General Appearance: Casual and Fairly Groomed  Eye Contact:  Good  Speech:  Clear and Coherent and Normal Rate  Volume:  Normal  Mood:  Euthymic  Affect:  Congruent  Thought Process:  Coherent and Descriptions of Associations: Circumstantial  Orientation:  Full (Time, Place, and Person)  Thought Content:  Logical  Suicidal Thoughts:  No  Homicidal Thoughts:  No  Memory:  Immediate;   Good Recent;   Good Remote;   Good  Judgement:  Fair  Insight:  Fair  Psychomotor Activity:  Decreased  Concentration:  Concentration: Good and Attention Span: Good  Recall:  Good  Fund of Knowledge:  Good  Language:  Good  Akathisia:  No  Handed:  Right  AIMS (if indicated):     Assets:  Communication Skills Desire for Improvement Financial Resources/Insurance Resilience Social Support  ADL's:  Intact  Cognition:  WNL  Sleep:  Number of Hours: 7.5     Treatment Plan Summary: Daily contact with patient to assess and evaluate symptoms and progress in treatment and Medication management   Assessment:  Stacey Boyd is a 75 y.o. female with a history of delusions and auditory hallucinations for the past 3 years.  Of note, patient has been on ropinirole which is a dopaminergic agent for the past 3 years which may be contributing.    Diagnosis: Psychosis [facility])  Treatment Plan Summary: Daily contact with patient to assess and evaluate symptoms and progress in treatment.   Scheduled medications/labs: -Zoloft 100 mg daily for depression and  anxiety -Mirtazapine 45 mg at bedtime for depression -Continue Risperdal M-Tab 2 mg daily at bedtime to prevent auditory hallucinations   Medical needs: -Lipitor 10 mg daily for hyperlipidemia -Vitamin D 5000 units daily for osteopenia -Cosopt 1 drop both eyes twice daily for glaucoma -Xalatan ophthalmic solution 1 drop at bedtime for glaucoma -Glimepiride 1 mg daily for diabetes -Continue gabapentin 300 mg at bedtime for leg pain, possible RLS -Continue multiple vitamin with iron for RLS prevention -Lidoderm patch to right shoulder -Continue Zestril/HCTZ's dose to 10/12.25 for hypertension -Continue Inderal to 10 mg twice daily as needed for elevated blood pressure or anxiety    PRN's : -Trazodone 25-50 mg oral as needed for sleep -Tylenol 650 mg every 6 hours as needed for pain -As needed's for GI complaints       Psychosocial:   1. Encouragement to attend group therapies. 2. Encourage patient to learn and implement coping strategies to avoid need for PRN medications. 3. Encouragement for medication compliance. 4. Anticipate discharge tomorrow to son's home  07/15/20 Psychiatric exam above reviewed and remains accurate. Assessment and plan above reviewed and updated.      Salley Scarlet, MD 07/15/2020, 1:29 PM

## 2020-07-15 NOTE — [facility] Group Notes (Signed)
[facility] Group Notes:  (Nursing/MHT/Case Management/Adjunct)  Date:  07/15/2020  Time:  9:11 AM  Type of Therapy:  Community Meeting  Participation Level:  Active  Participation Quality:  Appropriate and Attentive  Affect:  Appropriate  Cognitive:  Alert and Appropriate  Insight:  Appropriate  Engagement in Group:  Engaged  Modes of Intervention:  Discussion, Education and Support  Summary of Progress/Problems:  Stacey Boyd 07/15/2020, 9:11 AM

## 2020-07-15 NOTE — Progress Notes (Signed)
Patient calm and cooperative during assessment denying SI/HI/AVH, anxiety and depression. Patient stated to this writer that she is ready to D/C tomorrow. Patient observed interacting appropriately with staff and peers on the unit. Pt compliant with medication administration per MD orders. Pt given education, support, and encouragement to be active in her treatment plan. Patient being monitored Q 15 minutes for safety per unit protocol. Pt remains safe on the unit.

## 2020-07-15 NOTE — Progress Notes (Signed)
Patient is smiling when she presents to the medication room. Speech is logical and assertive. She denies SI, HI, and AVH. She says she slept well and her appetite is good. She states, "I've been feeling good for three days". She is present in the milieu and is observed to be interacting appropriately with staff and peers.  Medications were given per MD orders. Support and encouragement is provided. Patient remains safe on the unit at this time and q15 minute safety checks are maintained.

## 2020-07-15 NOTE — Progress Notes (Signed)
Recreation Therapy Notes  Date: 07/15/2020   Time: 9:30 am   Location: Craft room     Behavioral response: N/A   Intervention Topic: Relaxation  Discussion/Intervention: Patient did not attend group.   Clinical Observations/Feedback:  Patient did not attend group.   Ruffus Kamaka LRT/CTRS        Cleaven Demario 07/15/2020 4:16 PM

## 2020-07-15 NOTE — Plan of Care (Signed)
Patient states that she is feeling better and is ready to D/C tomorrow  Problem: Education: Goal: Emotional status will improve Outcome: Progressing Goal: Mental status will improve Outcome: Progressing

## 2020-07-16 MED ORDER — GABAPENTIN 300 MG PO CAPS: 300.0000 mg | ORAL_CAPSULE | Freq: Every day | ORAL | 1 refills | Status: DC

## 2020-07-16 MED ORDER — PROPRANOLOL HCL 10 MG PO TABS: 10.0000 mg | ORAL_TABLET | Freq: Two times a day (BID) | ORAL | 1 refills | Status: DC | PRN

## 2020-07-16 MED ORDER — RISPERIDONE 2 MG PO TABS: 2.0000 mg | ORAL_TABLET | Freq: Every day | ORAL | 1 refills | Status: DC

## 2020-07-16 MED ORDER — LIDOCAINE 5 % EX PTCH: 1.0000 | MEDICATED_PATCH | CUTANEOUS | 0 refills | Status: DC

## 2020-07-16 NOTE — Progress Notes (Signed)
Recreation Therapy Notes   Date: 07/16/2020  Time: 9:30 am   Location: Craft room     Behavioral response: N/A   Intervention Topic: Leisure   Discussion/Intervention: Patient did not attend group.   Clinical Observations/Feedback:  Patient did not attend group.   Synai Prettyman LRT/CTRS        Daanya Lanphier 07/16/2020 11:16 AM

## 2020-07-16 NOTE — Progress Notes (Signed)
Pt denies SI, HI and AVH. Pt was educated on dc plan and verbalizes understanding. Pt received AVS and belongings. Collier Bullock RN

## 2020-07-16 NOTE — [facility] Group Notes (Addendum)
LCSW Group Therapy Note  07/16/2020 2:02 PM  Type of Therapy/Topic:  Group Therapy:  Emotion Regulation  Participation Level:  Active   Description of Group:   The purpose of this group is to assist patients in learning to regulate negative emotions and experience positive emotions. Patients will be guided to discuss ways in which they have been vulnerable to their negative emotions. These vulnerabilities will be juxtaposed with experiences of positive emotions or situations, and patients will be challenged to use positive emotions to combat negative ones. Special emphasis will be placed on coping with negative emotions in conflict situations, and patients will process healthy conflict resolution skills.  Therapeutic Goals: 1. Patient will identify two positive emotions or experiences to reflect on in order to balance out negative emotions 2. Patient will label two or more emotions that they find the most difficult to experience 3. Patient will demonstrate positive conflict resolution skills through discussion and/or role plays  Summary of Patient Progress: Patient was present for the entirety of the group. She identified flowers as things that make her happy. Pt stated that she would usually walk through her yard with her cup of coffee and look at the flowers which would make her feel better. She participated in the activity.   Therapeutic Modalities:   Cognitive Behavioral Therapy Feelings Identification Dialectical Behavioral Therapy  Stacey Boyd, MSW, [place], [place] 07/16/2020 2:02 PM

## 2020-07-16 NOTE — Discharge Summary (Signed)
Physician Discharge Summary Note  Patient:  Stacey Boyd is an 75 y.o., female MRN:  [id number] DOB:  11-Oct-1945 Patient phone:  [phone] (home)  Patient address:   [place] [address],  Total Time spent with patient: 35 minutes- 25 minutes face-to-face contact with patient, 10 minutes documentation, coordination of care, scripts   Date of Admission:  07/11/2020 Date of Discharge: 07/16/2020  Reason for Admission:  Stacey Boyd is a 75 y.o. female with a history of auditory hallucinations and delusional disorder who was brought to the emergency room by her son for a right shoulder injury after running from her home in the dark and tripping in an attempt to get away from voices stating that they were going to kill her.  Principal Problem: Psychosis [facility]) Discharge Diagnoses: Active Problems:   Essential hypertension, benign   OA (osteoarthritis)   Osteopenia   Vitamin D deficiency   Delusional disorder ([place])   Type 2 diabetes mellitus with stage 3 chronic kidney disease (HCC)   Peripheral polyneuropathy   Primary insomnia   Acquired hypothyroidism   Restless leg syndrome   Past Psychiatric History: Patient has a past history of these kind of symptoms with a vague somewhat unclear diagnosis. The diagnosis of delusional disorder has been positive in the past mainly because she has these psychotic symptoms but without schizophrenia or a clear mood component. However, the hallucinations are actually the primary issue rather than the delusions. Furthermore she does not appear to have a significant degree of dementia or any other neurologic problem to account for the hallucinations. She has a history of agitated behavior similar to what she is showing now in response to these symptoms. She also describes herself as having longstanding chronic anxiety. She has had prior hospitalization and medication trials  Past Medical History:  Past Medical History:  Diagnosis  Date  . Allergy   . Anxiety   . Arthritis    legs  . Cancer ([place])    skin cancer on right shoulder.   . Depression   . Diabetes mellitus without complication ([place])   . Diverticulitis   . Dyspnea    with exertion  . Glaucoma   . Hyperlipidemia   . Hypertension    controlled on meds  . Hypothyroidism   . Liver cyst   . Osteoporosis   . Shoulder pain, left    and arm  . Thyroid disease   . Wrist fracture 2001   right    Past Surgical History:  Procedure Laterality Date  . ABDOMINAL HYSTERECTOMY  2005  . APPENDECTOMY  1980  . CARDIAC CATHETERIZATION Left 09/17/2015   Procedure: Left Heart Cath and Coronary Angiography;  Surgeon: Teodoro Spray, MD;  Location: [facility];  Service: Cardiovascular;  Laterality: Left;  . CARPAL TUNNEL RELEASE    . CATARACT EXTRACTION W/PHACO Left 09/25/2019   Procedure: CATARACT EXTRACTION PHACO AND INTRAOCULAR LENS PLACEMENT (IOC) LEFT   4.45  00:36.1;  Surgeon: Birder Robson, MD;  Location: [place];  Service: Ophthalmology;  Laterality: Left;  . CATARACT EXTRACTION W/PHACO Right 10/16/2019   Procedure: CATARACT EXTRACTION PHACO AND INTRAOCULAR LENS PLACEMENT (IOC)right  DIABETIC;  Surgeon: Birder Robson, MD;  Location: [place];  Service: Ophthalmology;  Laterality: Right;  5.29 0:33.7  . COLONOSCOPY WITH PROPOFOL N/A 11/07/2017   Procedure: COLONOSCOPY WITH PROPOFOL;  Surgeon: Jonathon Bellows, MD;  Location: [facility];  Service: Gastroenterology;  Laterality: N/A;  . FOOT SURGERY Left  2 nodules removed  . FRACTURE SURGERY Right    wrist  . WRIST FRACTURE SURGERY Right    Family History:  Family History  Problem Relation Age of Onset  . Heart disease Mother        h/o rheumatic fever  . Heart disease Father   . Stroke Father   . Diabetes Sister   . Diabetes Brother   . Diabetes Sister   . Mental illness Other   . Colon cancer Neg Hx   . Breast cancer Neg Hx    Family Psychiatric  History:  Denies Social History:  Social History   Substance and Sexual Activity  Alcohol Use No     Social History   Substance and Sexual Activity  Drug Use No    Social History   Socioeconomic History  . Marital status: Divorced    Spouse name: Not on file  . Number of children: 3  . Years of education: Not on file  . Highest education level: 10th grade  Occupational History  . Not on file  Tobacco Use  . Smoking status: Former Smoker    Packs/day: 0.25    Years: 10.00    Pack years: 2.50    Types: Cigarettes    Quit date: 09/17/1979    Years since quitting: 40.8  . Smokeless tobacco: Never Used  Vaping Use  . Vaping Use: Never used  Substance and Sexual Activity  . Alcohol use: No  . Drug use: No  . Sexual activity: Yes    Partners: Male    Birth control/protection: None  Other Topics Concern  . Not on file  Social History Narrative   3 kids, local   Lives with daughter as of 2016   Divorced ~2002   Social Determinants of Health   Financial Resource Strain: Not on file  Food Insecurity: Not on file  Transportation Needs: Not on file  Physical Activity: Not on file  Stress: Not on file  Social Connections: Not on file    Hospital Course:  Stacey Boyd is a 75 y.o. female with a history of auditory hallucinations and delusional disorder who was brought to the emergency room by her son for a right shoulder injury after running from her home in the dark and tripping in an attempt to get away from voices stating that they were going to kill her. On her first night in the hospital she also felt that people were trying to come through the window and door to her hospital room trying to hurt her. While here, ropinirole was discontinued due to dopaminergic activity potentially worsening hallucinations. She was also started on Risperdal and titrated to 2 mg nightly. All other home medications were continued. With this, hallucinations resolved, and she was able to sleep through  the night. She also seemed to gain more insight into fact that feeling of man following her was indeed a delusion, and not happening to her. Her son was contacted prior to discharge, and he felt safe with her returning home. Patient denies suicidal ideations, homicidal ideations, visual hallucinations, and auditory hallucinations.   Physical Findings: AIMS: Facial and Oral Movements Muscles of Facial Expression: None, normal Lips and Perioral Area: None, normal Jaw: None, normal Tongue: None, normal,Extremity Movements Upper (arms, wrists, hands, fingers): None, normal Lower (legs, knees, ankles, toes): None, normal, Trunk Movements Neck, shoulders, hips: None, normal, Overall Severity Severity of abnormal movements (highest score from questions above): None, normal Incapacitation due to abnormal movements: None,  normal Patient's awareness of abnormal movements (rate only patient's report): No Awareness, Dental Status Current problems with teeth and/or dentures?: No Does patient usually wear dentures?: No  CIWA:    COWS:     Musculoskeletal: Strength & Muscle Tone: within normal limits Gait & Station: normal Patient leans: N/A  Psychiatric Specialty Exam: Physical Exam Vitals and nursing note reviewed.  Constitutional:      Appearance: Normal appearance.  HENT:     Head: Normocephalic and atraumatic.     Right Ear: External ear normal.     Left Ear: External ear normal.     Nose: Nose normal.     Mouth/Throat:     Mouth: Mucous membranes are moist.     Pharynx: Oropharynx is clear.  Eyes:     Extraocular Movements: Extraocular movements intact.     Conjunctiva/sclera: Conjunctivae normal.     Pupils: Pupils are equal, round, and reactive to light.  Cardiovascular:     Rate and Rhythm: Normal rate.     Pulses: Normal pulses.  Pulmonary:     Effort: Pulmonary effort is normal.     Breath sounds: Normal breath sounds.  Abdominal:     General: Abdomen is flat.      Palpations: Abdomen is soft.  Musculoskeletal:        General: No swelling. Normal range of motion.     Cervical back: Normal range of motion and neck supple.  Skin:    General: Skin is warm and dry.  Neurological:     General: No focal deficit present.     Mental Status: She is alert and oriented to person, place, and time.  Psychiatric:        Mood and Affect: Mood normal.        Behavior: Behavior normal.        Thought Content: Thought content normal.        Judgment: Judgment normal.     Review of Systems  Constitutional: Negative for appetite change and fatigue.  HENT: Negative for rhinorrhea and sore throat.   Eyes: Negative for pain and visual disturbance.  Respiratory: Negative for cough and shortness of breath.   Cardiovascular: Negative for chest pain and palpitations.  Gastrointestinal: Negative for constipation, diarrhea, nausea and vomiting.  Endocrine: Negative for cold intolerance and heat intolerance.  Genitourinary: Negative for difficulty urinating and dysuria.  Musculoskeletal: Negative for arthralgias and myalgias.  Skin: Negative for rash and wound.  Allergic/Immunologic: Negative for food allergies and immunocompromised state.  Neurological: Negative for dizziness and headaches.  Hematological: Negative for adenopathy. Does not bruise/bleed easily.  Psychiatric/Behavioral: Negative for agitation, behavioral problems, hallucinations and suicidal ideas.    Blood pressure (!) 141/69, pulse 80, temperature 97.9 F (36.6 C), temperature source Oral, resp. rate 18, height 5\' 3"  (1.6 m), weight 75.8 kg, SpO2 95 %.Body mass index is 29.58 kg/m.  General Appearance: Well Groomed  Engineer, water::  Good  Speech:  Clear and Coherent and Normal Rate  Volume:  Normal  Mood:  Euthymic  Affect:  Congruent  Thought Process:  Coherent and Linear  Orientation:  Full (Time, Place, and Person)  Thought Content:  Logical  Suicidal Thoughts:  No  Homicidal Thoughts:  No   Memory:  Immediate;   Fair Recent;   Fair Remote;   Fair  Judgement:  Intact  Insight:  Present  Psychomotor Activity:  Normal  Concentration:  Fair  Recall:  [employer] of Knowledge:Fair  Language: Fair  Akathisia:  Negative  Handed:  Right  AIMS (if indicated):     Assets:  Communication Skills Desire for Improvement Financial Resources/Insurance [place] Talents/Skills Transportation  Sleep:  Number of Hours: 8  Cognition: WNL  ADL's:  Intact        Have you used any form of tobacco in the last 30 days? (Cigarettes, Smokeless Tobacco, Cigars, and/or Pipes): No  Has this patient used any form of tobacco in the last 30 days? (Cigarettes, Smokeless Tobacco, Cigars, and/or Pipes) No  Blood Alcohol level:  Lab Results  Component Value Date   ETH <10 07/11/2020   ETH <10 81/19/1478    Metabolic Disorder Labs:  Lab Results  Component Value Date   HGBA1C 6.3 (A) 04/23/2020   MPG 134.11 10/10/2017   No results found for: PROLACTIN Lab Results  Component Value Date   CHOL 182 02/26/2020   TRIG 333 (H) 02/26/2020   HDL 34 (L) 02/26/2020   CHOLHDL 4.4 10/10/2017   VLDL 21 10/10/2017   LDLCALC 92 02/26/2020   LDLCALC 124 (H) 10/10/2017    See Psychiatric Specialty Exam and Suicide Risk Assessment completed by Attending Physician prior to discharge.  Discharge destination:  Home  Is patient on multiple antipsychotic therapies at discharge:  No   Has Patient had three or more failed trials of antipsychotic monotherapy by history:  No  Recommended Plan for Multiple Antipsychotic Therapies: NA  Discharge Instructions    Diet - low sodium heart healthy   Complete by: As directed    Increase activity slowly   Complete by: As directed      Allergies as of 07/16/2020      Reactions   Codeine Hives, Nausea And Vomiting      Medication List    STOP taking these medications   Dorzolamide HCl-Timolol Mal PF 2-0.5 %  Soln   fexofenadine 180 MG tablet Commonly known as: ALLEGRA   meclizine 12.5 MG tablet Commonly known as: ANTIVERT   Melatonin 10 MG Tabs   nitrofurantoin (macrocrystal-monohydrate) 100 MG capsule Commonly known as: Macrobid   rOPINIRole 2 MG tablet Commonly known as: REQUIP   valACYclovir 1000 MG tablet Commonly known as: VALTREX     TAKE these medications     Indication  atorvastatin 10 MG tablet Commonly known as: LIPITOR Take 1 tablet (10 mg total) by mouth daily. FOR CHOLESTEROL  Indication: High Amount of Fats in the Blood   azelastine 0.1 % nasal spray Commonly known as: ASTELIN Place 2 sprays into both nostrils 2 (two) times daily. Use in each nostril as directed  Indication: Hayfever   gabapentin 300 MG capsule Commonly known as: NEURONTIN Take 1 capsule (300 mg total) by mouth at bedtime. What changed:   medication strength  how much to take  when to take this  Indication: Restless Leg Syndrome   glimepiride 1 MG tablet Commonly known as: Amaryl TAKE ONE TAB WITH LUNCH FOR DM What changed:   how much to take  how to take this  when to take this  Indication: Type 2 Diabetes   latanoprost 0.005 % ophthalmic solution Commonly known as: XALATAN Place 1 drop into both eyes at bedtime.  Indication: Persistently Increased Pressure in the Eye   lidocaine 5 % Commonly known as: LIDODERM Place 1 patch onto the skin daily. Remove & Discard patch within 12 hours or as directed by MD  Indication: Allodynia   lisinopril-hydrochlorothiazide 20-12.5 MG tablet Commonly known as: ZESTORETIC Take  2 tablets by mouth daily.  Indication: High Blood Pressure Disorder   mirtazapine 45 MG disintegrating tablet Commonly known as: REMERON SOL-TAB Take 1 tablet (45 mg total) by mouth at bedtime.  Indication: Major Depressive Disorder   propranolol 10 MG tablet Commonly known as: INDERAL Take 1 tablet (10 mg total) by mouth 2 (two) times daily as needed  (anxiety). What changed:   when to take this  reasons to take this  Indication: Feeling Anxious   risperiDONE 2 MG tablet Commonly known as: RisperDAL Take 1 tablet (2 mg total) by mouth at bedtime.  Indication: Delusions, Major Depressive Disorder   [place] Senior Tabs Take 1 tablet by mouth daily. Eye vitamin  Indication: nutritional support   sertraline 100 MG tablet Commonly known as: ZOLOFT Take 1 tablet (100 mg total) by mouth daily.  Indication: Major Depressive Disorder   traZODone 50 MG tablet Commonly known as: DESYREL Take 0.5-1 tablets (25-50 mg total) by mouth at bedtime as needed for sleep.  Indication: Major Depressive Disorder   TYLENOL ARTHRITIS PAIN PO Take 650-1,300 mg by mouth as needed.  Indication: arthritis pain   Vitamin D3 125 MCG (5000 UT) Tabs Take 5,000 Units by mouth daily.  Indication: general health        Follow-up recommendations:  Activity:  as tolerated Diet:  low sodium heart healthy diet  Comments:  30-day scripts with one refill sent to [facility] per patient request. [facility] was also contacted to discard remaining refills of requip on 07/16/20 at 0930.   Signed: Salley Scarlet, MD 07/16/2020, 9:39 AM

## 2020-07-16 NOTE — Progress Notes (Signed)
Recreation Therapy Notes  INPATIENT RECREATION TR PLAN  Patient Details Name: Stacey Boyd MRN: [id number] DOB: 20-Apr-1946 Today's Date: 07/16/2020  Rec Therapy Plan Is patient appropriate for Therapeutic Recreation?: Yes Treatment times per week: at least 3 Estimated Length of Stay: 5-7 Days TR Treatment/Interventions: Group participation (Comment)  Discharge Criteria Pt will be discharged from therapy if:: Discharged Treatment plan/goals/alternatives discussed and agreed upon by:: Patient/family  Discharge Summary Short term goals set: Patient will engage in groups without prompting or encouragement from LRT x3 group sessions within 5 recreation therapy group sessions Short term goals met: Adequate for discharge Progress toward goals comments: Groups attended Which groups?: Self-esteem Reason goals not met: N/A Therapeutic equipment acquired: N/A Reason patient discharged from therapy: Discharge from hospital Pt/family agrees with progress & goals achieved: Yes Date patient discharged from therapy: 07/16/20   Ilani Otterson 07/16/2020, 2:40 PM

## 2020-07-16 NOTE — [facility] Suicide Risk Assessment (Signed)
[facility] Discharge Suicide Risk Assessment   Principal Problem: Psychosis [facility]) Discharge Diagnoses: Principal Problem:   Psychosis ([place]) Active Problems:   Essential hypertension, benign   OA (osteoarthritis)   Osteopenia   Vitamin D deficiency   Delusional disorder ([place])   Type 2 diabetes mellitus with stage 3 chronic kidney disease (HCC)   Peripheral polyneuropathy   Auditory hallucination   Primary insomnia   Acquired hypothyroidism   Restless leg syndrome   Dysuria   Vertigo   Total Time spent with patient: 35 minutes- 25 minutes face-to-face contact with patient, 10 minutes documentation, coordination of care, scripts   Musculoskeletal: Strength & Muscle Tone: within normal limits Gait & Station: normal Patient leans: N/A  Psychiatric Specialty Exam: Review of Systems  Constitutional: Negative for appetite change and fatigue.  HENT: Negative for rhinorrhea and sore throat.   Eyes: Negative for pain and visual disturbance.  Respiratory: Negative for cough and shortness of breath.   Cardiovascular: Negative for chest pain and palpitations.  Gastrointestinal: Negative for constipation, diarrhea, nausea and vomiting.  Endocrine: Negative for cold intolerance and heat intolerance.  Genitourinary: Negative for difficulty urinating and dysuria.  Musculoskeletal: Negative for arthralgias and myalgias.  Skin: Negative for rash and wound.  Allergic/Immunologic: Negative for food allergies and immunocompromised state.  Neurological: Negative for dizziness and headaches.  Hematological: Negative for adenopathy. Does not bruise/bleed easily.  Psychiatric/Behavioral: Negative for agitation, behavioral problems, hallucinations and suicidal ideas.    Blood pressure (!) 141/69, pulse 80, temperature 97.9 F (36.6 C), temperature source Oral, resp. rate 18, height 5\' 3"  (1.6 m), weight 75.8 kg, SpO2 95 %.Body mass index is 29.58 kg/m.  General Appearance: Well Groomed  Engineer, water::   Good  Speech:  Clear and Coherent and Normal Rate  Volume:  Normal  Mood:  Euthymic  Affect:  Congruent  Thought Process:  Coherent and Linear  Orientation:  Full (Time, Place, and Person)  Thought Content:  Logical  Suicidal Thoughts:  No  Homicidal Thoughts:  No  Memory:  Immediate;   Fair Recent;   Fair Remote;   Fair  Judgement:  Intact  Insight:  Present  Psychomotor Activity:  Normal  Concentration:  Fair  Recall:  [employer] of Knowledge:Fair  Language: Fair  Akathisia:  Negative  Handed:  Right  AIMS (if indicated):     Assets:  Communication Skills Desire for Improvement Financial Resources/Insurance Housing Physical Health Resilience Social Support Talents/Skills Transportation  Sleep:  Number of Hours: 8  Cognition: WNL  ADL's:  Intact   Mental Status Per Nursing Assessment::   On Admission:  NA  Demographic Factors:  Age 75 or older and Caucasian  Loss Factors: NA  Historical Factors: NA  Risk Reduction Factors:   Sense of responsibility to family, Religious beliefs about death, Living with another person, especially a relative, Positive social support, Positive therapeutic relationship and Positive coping skills or problem solving skills  Continued Clinical Symptoms:  Previous Psychiatric Diagnoses and Treatments  Cognitive Features That Contribute To Risk:  None    Suicide Risk:  Minimal: No identifiable suicidal ideation.  Patients presenting with no risk factors but with morbid ruminations; may be classified as minimal risk based on the severity of the depressive symptoms    Plan Of Care/Follow-up recommendations:  Activity:  as tolerated Diet:  low sodium heart healthy diet  Salley Scarlet, MD 07/16/2020, 9:34 AM

## 2020-07-16 NOTE — Progress Notes (Signed)
  [facility] Adult Case Management Discharge Plan :  Will you be returning to the same living situation after discharge:  Yes,  pt reports that she will be staying with her son. At discharge, do you have transportation home?: Yes,  pt reports that son will provide transportation.  Do you have the ability to pay for your medications: Yes,  [employer] Medicare and [facility].  Release of information consent forms completed and in the chart;  Patient's signature needed at discharge.  Patient to Follow up at:  Follow-up Information    [facility] Follow up.   Why: Walk in apointments are Wednesday, Thursday and Friday from 9-4.  Thanks! Contact information: [place] [address] [phone]               Next level of care provider has access to [place] and Suicide Prevention discussed: Yes,  SPE completed with patient and patient's son.   Have you used any form of tobacco in the last 30 days? (Cigarettes, Smokeless Tobacco, Cigars, and/or Pipes): No  Has patient been referred to the Quitline?: Patient refused referral  Patient has been referred for addiction treatment: Pt. refused referral  Rozann Lesches, LCSW 07/16/2020, 10:16 AM

## 2020-07-16 NOTE — Plan of Care (Signed)
Pt denies depression, anxiety, SI, HI and AVH. Pt was educated on care plan and verbalizes understanding. Pt anticipates discharge. Collier Bullock RN Problem: Education: Goal: Knowledge of [place] [facility] Education information/materials will improve Outcome: Adequate for Discharge Goal: Emotional status will improve Outcome: Adequate for Discharge Goal: Mental status will improve Outcome: Adequate for Discharge Goal: Verbalization of understanding the information provided will improve Outcome: Adequate for Discharge   Problem: Activity: Goal: Interest or engagement in activities will improve Outcome: Adequate for Discharge Goal: Sleeping patterns will improve Outcome: Adequate for Discharge   Problem: Coping: Goal: Ability to verbalize frustrations and anger appropriately will improve Outcome: Adequate for Discharge Goal: Ability to demonstrate self-control will improve Outcome: Adequate for Discharge   Problem: Health Behavior/Discharge Planning: Goal: Identification of resources available to assist in meeting health care needs will improve Outcome: Adequate for Discharge Goal: Compliance with treatment plan for underlying cause of condition will improve Outcome: Adequate for Discharge   Problem: Physical Regulation: Goal: Ability to maintain clinical measurements within normal limits will improve Outcome: Adequate for Discharge   Problem: Safety: Goal: Periods of time without injury will increase Outcome: Adequate for Discharge   Problem: Activity: Goal: Will verbalize the importance of balancing activity with adequate rest periods Outcome: Adequate for Discharge   Problem: Education: Goal: Will be free of psychotic symptoms Outcome: Adequate for Discharge Goal: Knowledge of the prescribed therapeutic regimen will improve Outcome: Adequate for Discharge   Problem: Coping: Goal: Coping ability will improve Outcome: Adequate for Discharge Goal: Will verbalize  feelings Outcome: Adequate for Discharge   Problem: Health Behavior/Discharge Planning: Goal: Compliance with prescribed medication regimen will improve Outcome: Adequate for Discharge    Problem: Nutritional: Goal: Ability to achieve adequate nutritional intake will improve Outcome: Adequate for Discharge   Problem: Role Relationship: Goal: Ability to communicate needs accurately will improve Outcome: Adequate for Discharge Goal: Ability to interact with others will improve Outcome: Adequate for Discharge   Problem: Safety: Goal: Ability to redirect hostility and anger into socially appropriate behaviors will improve Outcome: Adequate for Discharge Goal: Ability to remain free from injury will improve Outcome: Adequate for Discharge   Problem: Self-Care: Goal: Ability to participate in self-care as condition permits will improve Outcome: Adequate for Discharge   Problem: Self-Concept: Goal: Will verbalize positive feelings about self Outcome: Adequate for Discharge

## 2020-07-16 NOTE — Care Management Important Message (Signed)
Important Message  Patient Details  Name: Stacey Boyd MRN: [id number] Date of Birth: [birth year]   Medicare Important Message Given:  Yes     Rozann Lesches, LCSW 07/16/2020, 10:11 AM

## 2020-07-16 NOTE — Plan of Care (Signed)
  Problem: Group Participation Goal: STG - Patient will engage in groups without prompting or encouragement from LRT x3 group sessions within 5 recreation therapy group sessions Description: STG - Patient will engage in groups without prompting or encouragement from LRT x3 group sessions within 5 recreation therapy group sessions 07/16/2020 1438 by Ernest Haber, LRT Outcome: Adequate for Discharge 07/16/2020 1438 by Ernest Haber, LRT Outcome: Adequate for Discharge

## 2020-07-24 ENCOUNTER — Other Ambulatory Visit: Payer: Self-pay

## 2020-07-24 ENCOUNTER — Encounter: Payer: Self-pay | Admitting: Gastroenterology

## 2020-07-24 ENCOUNTER — Ambulatory Visit (INDEPENDENT_AMBULATORY_CARE_PROVIDER_SITE_OTHER): Payer: Medicare Other | Admitting: Gastroenterology

## 2020-07-24 VITALS — BP 127/62 | HR 56 | Ht 63.0 in | Wt 165.2 lb

## 2020-07-24 DIAGNOSIS — R1011 Right upper quadrant pain: Secondary | ICD-10-CM | POA: Diagnosis not present

## 2020-07-24 DIAGNOSIS — K7689 Other specified diseases of liver: Secondary | ICD-10-CM

## 2020-07-24 DIAGNOSIS — G8929 Other chronic pain: Secondary | ICD-10-CM | POA: Diagnosis not present

## 2020-07-24 NOTE — Progress Notes (Signed)
Jonathon Bellows MD, MRCP(U.K) [address]  [place]  [place], [place] [address]  Main: [phone]  Fax: [phone]   Primary Care Physician: Lavera Guise, MD  Primary Gastroenterologist:  Dr. Jonathon Bellows   Follow-up for right upper quadrant pain and liver cyst  HPI: Stacey Boyd is a 75 y.o. female    Summary of history :  Stacey Boyd is a 75 y.o. female who was previously seen by me in 2019 for osmotic diarrhea due to consumption of high quantity of fructose corn syrup in her diet.  Resolved with cessation of certain food items.  She is here today as she had an ultrasound of the abdomen in November 2021.That demonstrated a simple cyst in the left lobe of the liver and in the right lobe there is a complicated septated cyst measuring 9.5 x 9.7 cm.  There was tenderness noted when the transducer apply pressure over the area of the cyst.  CT scan of the abdomen in April 2019 demonstrated multiple stable well-circumscribed lesions throughout the liver compatible with cysts.  Largest cyst was 8.1 x 7.5 cm.  At her visit in January 2022 she had pain in the right side of her abdomen in the midclavicular line and tenderness on palpation in the area of the right upper quadrant    Interval history 06/16/2020-07/24/2020    06/26/2020 MRI of the liver with and without contrast shows stable benign appearing hepatic cysts the largest measuring 9.5 cm.  Stable large distal duodenal diverticulum.  She states that since her last visit she has had absolutely no pain in the right side of her abdomen.   Current Outpatient Medications  Medication Sig Dispense Refill  . Acetaminophen (TYLENOL ARTHRITIS PAIN PO) Take 650-1,300 mg by mouth as needed.    Marland Kitchen atorvastatin (LIPITOR) 10 MG tablet Take 1 tablet (10 mg total) by mouth daily. FOR CHOLESTEROL 90 tablet 3  . azelastine (ASTELIN) 0.1 % nasal spray Place 2 sprays into both nostrils 2 (two) times daily. Use in each nostril as directed  30 mL 1  . Cholecalciferol (VITAMIN D3) 5000 units TABS Take 5,000 Units by mouth daily.     Marland Kitchen gabapentin (NEURONTIN) 300 MG capsule Take 1 capsule (300 mg total) by mouth at bedtime. 30 capsule 1  . glimepiride (AMARYL) 1 MG tablet TAKE ONE TAB WITH LUNCH FOR DM (Patient taking differently: Take 1 mg by mouth daily with lunch. TAKE ONE TAB WITH LUNCH FOR DM) 90 tablet 3  . latanoprost (XALATAN) 0.005 % ophthalmic solution Place 1 drop into both eyes at bedtime.    . lidocaine (LIDODERM) 5 % Place 1 patch onto the skin daily. Remove & Discard patch within 12 hours or as directed by MD 30 patch 0  . lisinopril-hydrochlorothiazide (ZESTORETIC) 20-12.5 MG tablet Take 2 tablets by mouth daily.    . mirtazapine (REMERON SOL-TAB) 45 MG disintegrating tablet Take 1 tablet (45 mg total) by mouth at bedtime. 30 tablet 2  . Multiple Vitamins-Minerals (SENTRY SENIOR) TABS Take 1 tablet by mouth daily. Eye vitamin    . propranolol (INDERAL) 10 MG tablet Take 1 tablet (10 mg total) by mouth 2 (two) times daily as needed (anxiety). 60 tablet 1  . risperiDONE (RISPERDAL) 2 MG tablet Take 1 tablet (2 mg total) by mouth at bedtime. 30 tablet 1  . sertraline (ZOLOFT) 100 MG tablet Take 1 tablet (100 mg total) by mouth daily. 30 tablet 3  . traZODone (DESYREL) 50 MG  tablet Take 0.5-1 tablets (25-50 mg total) by mouth at bedtime as needed for sleep. 30 tablet 3   No current facility-administered medications for this visit.    Allergies as of 07/24/2020 - Review Complete 07/11/2020  Allergen Reaction Noted  . Codeine Hives and Nausea And Vomiting 03/06/2013    ROS:  General: Negative for anorexia, weight loss, fever, chills, fatigue, weakness. ENT: Negative for hoarseness, difficulty swallowing , nasal congestion. CV: Negative for chest pain, angina, palpitations, dyspnea on exertion, peripheral edema.  Respiratory: Negative for dyspnea at rest, dyspnea on exertion, cough, sputum, wheezing.  GI: See history  of present illness. GU:  Negative for dysuria, hematuria, urinary incontinence, urinary frequency, nocturnal urination.  Endo: Negative for unusual weight change.    Physical Examination:   There were no vitals taken for this visit.  General: Well-nourished, well-developed in no acute distress.  Eyes: No icterus. Conjunctivae pink. Mouth: Oropharyngeal mucosa moist and pink , no lesions erythema or exudate. Lungs: Clear to auscultation bilaterally. Non-labored. Heart: Regular rate and rhythm, no murmurs rubs or gallops.  Abdomen: Bowel sounds are normal, nontender, nondistended, no hepatosplenomegaly or masses, no abdominal bruits or hernia , no rebound or guarding.   Extremities: No lower extremity edema. No clubbing or deformities. Neuro: Alert and oriented x 3.  Grossly intact. Skin: Warm and dry, no jaundice.   Psych: Alert and cooperative, normal mood and affect.   Imaging Studies: DG Shoulder Right  Result Date: 07/10/2020 CLINICAL DATA:  Right shoulder pain for 1 week. Increased pain after a fall last night. EXAM: RIGHT SHOULDER - 2+ VIEW COMPARISON:  None. FINDINGS: No acute fracture or dislocation is identified. No significant glenohumeral or acromioclavicular arthropathy is evident for age. The soft tissues are unremarkable. IMPRESSION: Negative. Electronically Signed   By: Logan Bores M.D.   On: 07/10/2020 11:12   CT Head Wo Contrast  Result Date: 07/11/2020 CLINICAL DATA:  75 year old female status post fall last night landing on right shoulder. Altered mental status and increased pain. EXAM: CT HEAD WITHOUT CONTRAST TECHNIQUE: Contiguous axial images were obtained from the base of the skull through the vertex without intravenous contrast. COMPARISON:  Head CT 04/07/2018. FINDINGS: Brain: Cerebral volume is stable and within normal limits for age. No midline shift, ventriculomegaly, mass effect, evidence of mass lesion, intracranial hemorrhage or evidence of cortically based  acute infarction. Mild for age scattered white matter hypodensity is stable. No cortical encephalomalacia identified. Vascular: No suspicious intracranial vascular hyperdensity. Skull: Stable and intact. Sinuses/Orbits: Visualized paranasal sinuses and mastoids are stable and well pneumatized. Other: Visualized orbits and scalp soft tissues are within normal limits. IMPRESSION: 1. No acute intracranial abnormality or acute traumatic injury identified. 2. Stable non contrast CT appearance of the brain, mild for age white matter changes. Electronically Signed   By: Genevie Ann M.D.   On: 07/11/2020 04:30   MR LIVER W WO CONTRAST  Result Date: 06/26/2020 CLINICAL DATA:  Right upper quadrant pain for 3 months. Nausea and vomiting. Liver lesion seen on outside ultrasound. EXAM: MRI ABDOMEN WITHOUT AND WITH CONTRAST TECHNIQUE: Multiplanar multisequence MR imaging of the abdomen was performed both before and after the administration of intravenous contrast. CONTRAST:  7.57mL GADAVIST GADOBUTROL 1 MMOL/ML IV SOLN COMPARISON:  CT on 09/09/2017 FINDINGS: Lower chest: No acute findings. Hepatobiliary: Multiple hepatic cysts are again seen some containing thin internal septations. Largest in the dome of the right lobe measures 9.5 cm, and these show no significant change compared to prior  CT in 2019. No enhancing nodules or solid components are identified. Gallbladder is unremarkable. No evidence of biliary ductal dilatation. Pancreas:  No mass or inflammatory changes. Spleen:  Within normal limits in size and appearance. Adrenals/Urinary Tract: No masses identified. No evidence of hydronephrosis. Stomach/Bowel: Large distal duodenal diverticulum measuring approximately 5 cm. Otherwise unremarkable. Vascular/Lymphatic: No pathologically enlarged lymph nodes identified. No abdominal aortic aneurysm. Other:  None. Musculoskeletal:  No suspicious bone lesions identified. IMPRESSION: Stable benign-appearing hepatic cysts, largest  measuring 9.5 cm. No evidence of hepatic neoplasm or other acute findings. Stable large distal duodenal diverticulum. Electronically Signed   By: Marlaine Hind M.D.   On: 06/26/2020 15:57    Assessment and Plan:   Stacey Boyd is a 75 y.o. y/o female here to follow-up for symptomatic cyst on the liver.  She has a 9.5 cm benign-appearing cyst noted on the MRI with no concerning features.  At her last visit she was symptomatic with pain in the right side of her abdomen which she says have completely resolved and does not have any issues whatsoever at this point of time.  I explained to her if pain returns then to call my office and we will refer her to [facility] to be evaluated by hepatobiliary surgeon if there is no pain whatsoever and she does not need any further intervention.    Dr Jonathon Bellows  MD,MRCP [facility]) Follow up as needed

## 2020-07-31 ENCOUNTER — Telehealth: Payer: Self-pay

## 2020-07-31 NOTE — Telephone Encounter (Signed)
Faxed medical records to [facility] per there request

## 2020-09-05 ENCOUNTER — Telehealth: Payer: Self-pay

## 2020-09-05 NOTE — Telephone Encounter (Signed)
Completed medical records for [facility]  Mailed to [address] [place] [address]

## 2020-09-18 ENCOUNTER — Encounter: Payer: Self-pay | Admitting: Physician Assistant

## 2020-09-18 ENCOUNTER — Other Ambulatory Visit: Payer: Self-pay

## 2020-09-18 ENCOUNTER — Ambulatory Visit (INDEPENDENT_AMBULATORY_CARE_PROVIDER_SITE_OTHER): Payer: Medicare Other | Admitting: Hospice and Palliative Medicine

## 2020-09-18 VITALS — BP 132/64 | HR 67 | Temp 98.3°F | Resp 16 | Ht 63.0 in | Wt 167.4 lb

## 2020-09-18 DIAGNOSIS — M25511 Pain in right shoulder: Secondary | ICD-10-CM | POA: Diagnosis not present

## 2020-09-18 DIAGNOSIS — E1122 Type 2 diabetes mellitus with diabetic chronic kidney disease: Secondary | ICD-10-CM

## 2020-09-18 DIAGNOSIS — E119 Type 2 diabetes mellitus without complications: Secondary | ICD-10-CM

## 2020-09-18 DIAGNOSIS — G8929 Other chronic pain: Secondary | ICD-10-CM

## 2020-09-18 DIAGNOSIS — F323 Major depressive disorder, single episode, severe with psychotic features: Secondary | ICD-10-CM

## 2020-09-18 DIAGNOSIS — I10 Essential (primary) hypertension: Secondary | ICD-10-CM

## 2020-09-18 LAB — POCT GLYCOSYLATED HEMOGLOBIN (HGB A1C): Hemoglobin A1C: 6.2 % — AB (ref 4.0–5.6)

## 2020-09-18 NOTE — Progress Notes (Signed)
[facility] [place], [place] [address]  Internal MEDICINE  Office Visit Note  Patient Name: Stacey Boyd  [id number]  [id number]  Date of Service: 09/19/2020  Chief Complaint  Patient presents with  . Follow-up    Right arm pain can not raise it above waist side   . Allergies  . Anxiety  . Cancer  . Depression  . Diabetes  . Hyperlipidemia  . Hypertension  . Hyperthyroidism  . Quality Metric Gaps    Foot exam    HPI Patient is here for routine follow-up Had an inpatient behavioral health stay in February for psychosis and delusional disorder Has since stopped taking gabapentin due to causing her to feel "drunk" and off balance Working on getting established with psychiatrist on outpatient setting Started on Risperidone while hospitalized, continued on mirtazapine, trazodone as well as Zoloft Feeling that her anxiety and depression are much better controlled  Has recently moved into senior living apartments last week, working on getting settled into her new place  BP elevated today--has been taking propranolol, no longer taking lisinopril-HCTZ explains this medication was discontinued a while ago as her BP was well controlled BP initially elevated but much improved on recheck  Followed by GI for liver cysts, MRI revealed normal liver cysts without suspicious appearance, at this time she is not experiencing any symptoms of pain Advised to contact GI office if she develops further pain and will need referral to [facility] specialist  DM-continues to take Amaryl, does not routinely monitor glucose levels at home  Complaining of right arm pain--has been going on for several months, had an xray while in hospital in February, normal imaging--has seen an ortho specialist in the past and received a cortisone injection, cannot remember when that was but does remember that the injection resolved her pain at that time  Sleeping well, no recent changes in her  appetite or issues with constipation  Current Medication: Outpatient Encounter Medications as of 09/18/2020  Medication Sig  . Acetaminophen (TYLENOL ARTHRITIS PAIN PO) Take 650-1,300 mg by mouth as needed.  Marland Kitchen atorvastatin (LIPITOR) 10 MG tablet Take 1 tablet (10 mg total) by mouth daily. FOR CHOLESTEROL  . azelastine (ASTELIN) 0.1 % nasal spray Place 2 sprays into both nostrils 2 (two) times daily. Use in each nostril as directed  . Cholecalciferol (VITAMIN D3) 5000 units TABS Take 5,000 Units by mouth daily.   Marland Kitchen gabapentin (NEURONTIN) 300 MG capsule Take 1 capsule (300 mg total) by mouth at bedtime.  Marland Kitchen glimepiride (AMARYL) 1 MG tablet TAKE ONE TAB WITH LUNCH FOR DM (Patient taking differently: Take 1 mg by mouth daily with lunch. TAKE ONE TAB WITH LUNCH FOR DM)  . latanoprost (XALATAN) 0.005 % ophthalmic solution Place 1 drop into both eyes at bedtime.  . lidocaine (LIDODERM) 5 % Place 1 patch onto the skin daily. Remove & Discard patch within 12 hours or as directed by MD  . lisinopril-hydrochlorothiazide (ZESTORETIC) 20-12.5 MG tablet Take 2 tablets by mouth daily.  . mirtazapine (REMERON SOL-TAB) 45 MG disintegrating tablet Take 1 tablet (45 mg total) by mouth at bedtime.  . Multiple Vitamins-Minerals (SENTRY SENIOR) TABS Take 1 tablet by mouth daily. Eye vitamin  . propranolol (INDERAL) 10 MG tablet Take 1 tablet (10 mg total) by mouth 2 (two) times daily as needed (anxiety).  . risperiDONE (RISPERDAL) 2 MG tablet Take 1 tablet (2 mg total) by mouth at bedtime.  . sertraline (ZOLOFT) 100 MG tablet Take 1  tablet (100 mg total) by mouth daily.  . traZODone (DESYREL) 50 MG tablet Take 0.5-1 tablets (25-50 mg total) by mouth at bedtime as needed for sleep.   No facility-administered encounter medications on file as of 09/18/2020.    Surgical History: Past Surgical History:  Procedure Laterality Date  . ABDOMINAL HYSTERECTOMY  2005  . APPENDECTOMY  1980  . CARDIAC CATHETERIZATION Left  09/17/2015   Procedure: Left Heart Cath and Coronary Angiography;  Surgeon: Teodoro Spray, MD;  Location: [facility];  Service: Cardiovascular;  Laterality: Left;  . CARPAL TUNNEL RELEASE    . CATARACT EXTRACTION W/PHACO Left 09/25/2019   Procedure: CATARACT EXTRACTION PHACO AND INTRAOCULAR LENS PLACEMENT (IOC) LEFT   4.45  00:36.1;  Surgeon: Birder Robson, MD;  Location: [facility];  Service: Ophthalmology;  Laterality: Left;  . CATARACT EXTRACTION W/PHACO Right 10/16/2019   Procedure: CATARACT EXTRACTION PHACO AND INTRAOCULAR LENS PLACEMENT (IOC)right  DIABETIC;  Surgeon: Birder Robson, MD;  Location: [facility];  Service: Ophthalmology;  Laterality: Right;  5.29 0:33.7  . COLONOSCOPY WITH PROPOFOL N/A 11/07/2017   Procedure: COLONOSCOPY WITH PROPOFOL;  Surgeon: Jonathon Bellows, MD;  Location: [facility];  Service: Gastroenterology;  Laterality: N/A;  . FOOT SURGERY Left    2 nodules removed  . FRACTURE SURGERY Right    wrist  . WRIST FRACTURE SURGERY Right     Medical History: Past Medical History:  Diagnosis Date  . Allergy   . Anxiety   . Arthritis    legs  . Cancer ([place])    skin cancer on right shoulder.   . Depression   . Diabetes mellitus without complication ([place])   . Diverticulitis   . Dyspnea    with exertion  . Glaucoma   . Hyperlipidemia   . Hypertension    controlled on meds  . Hypothyroidism   . Liver cyst   . Osteoporosis   . Shoulder pain, left    and arm  . Thyroid disease   . Wrist fracture 2001   right    Family History: Family History  Problem Relation Age of Onset  . Heart disease Mother        h/o rheumatic fever  . Heart disease Father   . Stroke Father   . Diabetes Sister   . Diabetes Brother   . Diabetes Sister   . Mental illness Other   . Colon cancer Neg Hx   . Breast cancer Neg Hx     Social History   Socioeconomic History  . Marital status: Divorced    Spouse name: Not on file  . Number of  children: 3  . Years of education: Not on file  . Highest education level: 10th grade  Occupational History  . Not on file  Tobacco Use  . Smoking status: Former Smoker    Packs/day: 0.25    Years: 10.00    Pack years: 2.50    Types: Cigarettes    Quit date: 09/17/1979    Years since quitting: 41.0  . Smokeless tobacco: Never Used  Vaping Use  . Vaping Use: Never used  Substance and Sexual Activity  . Alcohol use: No  . Drug use: No  . Sexual activity: Yes    Partners: Male    Birth control/protection: None  Other Topics Concern  . Not on file  Social History Narrative   3 kids, local   Lives with daughter as of 2016   Divorced ~2002  Social Determinants of Health   Financial Resource Strain: Not on file  Food Insecurity: Not on file  Transportation Needs: Not on file  Physical Activity: Not on file  Stress: Not on file  Social Connections: Not on file  Intimate Partner Violence: Not on file      Review of Systems  Constitutional: Negative for chills, diaphoresis and fatigue.  HENT: Negative for ear pain, postnasal drip and sinus pressure.   Eyes: Negative for photophobia, discharge, redness, itching and visual disturbance.  Respiratory: Negative for cough, shortness of breath and wheezing.   Cardiovascular: Negative for chest pain, palpitations and leg swelling.  Gastrointestinal: Negative for abdominal pain, constipation, diarrhea, nausea and vomiting.  Genitourinary: Negative for dysuria and flank pain.  Musculoskeletal: Negative for arthralgias, back pain, gait problem and neck pain.       Right arm/shoulder pain  Skin: Negative for color change.  Allergic/Immunologic: Negative for environmental allergies and food allergies.  Neurological: Negative for dizziness and headaches.  Hematological: Does not bruise/bleed easily.  Psychiatric/Behavioral: Negative for agitation, behavioral problems (depression) and hallucinations.    Vital Signs: BP 132/64    Pulse 67   Temp 98.3 F (36.8 C)   Resp 16   Ht 5\' 3"  (1.6 m)   Wt 167 lb 6.4 oz (75.9 kg)   SpO2 93%   BMI 29.65 kg/m    Physical Exam Vitals reviewed.  Constitutional:      Appearance: Normal appearance. She is normal weight.  Cardiovascular:     Rate and Rhythm: Normal rate and regular rhythm.     Pulses: Normal pulses.     Heart sounds: Normal heart sounds.  Pulmonary:     Effort: Pulmonary effort is normal.     Breath sounds: Normal breath sounds.  Abdominal:     General: Abdomen is flat.     Palpations: Abdomen is soft.  Musculoskeletal:     Right shoulder: Decreased range of motion.     Cervical back: Normal range of motion.  Skin:    General: Skin is warm.  Neurological:     General: No focal deficit present.     Mental Status: She is alert and oriented to person, place, and time. Mental status is at baseline.  Psychiatric:        Mood and Affect: Mood normal.        Behavior: Behavior normal.        Thought Content: Thought content normal.        Judgment: Judgment normal.    Assessment/Plan: 1. Type 2 diabetes mellitus without complication, without long-term current use of insulin (HCC) A1C 6.2--remains well controlled, continue to monitor - POCT HgB A1C  2. Chronic right shoulder pain Normal imaging, decreased ROM, referral to ortho for further evaluation and treatment - Ambulatory referral to Orthopedic Surgery  3. Essential hypertension, benign BP and HR remain well controlled on current management, continue to monitor  4. Major depression with psychotic features [facility]) Recent inpatient stay, working on getting established with outpatient psychiatry, symptoms better controlled  General Counseling: tecora eustache understanding of the findings of todays visit and agrees with plan of treatment. I have discussed any further diagnostic evaluation that may be needed or ordered today. We also reviewed her medications today. she has been encouraged to call  the office with any questions or concerns that should arise related to todays visit.    Orders Placed This Encounter  Procedures  . Ambulatory referral to Orthopedic Surgery  .  POCT HgB A1C    Time spent:30 Minutes Time spent includes review of chart, medications, test results and follow-up plan with the patient.  This patient was seen by Theodoro Grist AGNP-C in Collaboration with Dr Lavera Guise as a part of collaborative care agreement     Tanna Furry. Seairra Otani AGNP-C Internal medicine

## 2020-09-19 ENCOUNTER — Encounter: Payer: Self-pay | Admitting: Hospice and Palliative Medicine

## 2020-10-16 ENCOUNTER — Other Ambulatory Visit: Payer: Self-pay | Admitting: Physician Assistant

## 2020-10-16 DIAGNOSIS — G4720 Circadian rhythm sleep disorder, unspecified type: Secondary | ICD-10-CM

## 2020-10-16 DIAGNOSIS — K7689 Other specified diseases of liver: Secondary | ICD-10-CM

## 2020-10-16 DIAGNOSIS — Z0001 Encounter for general adult medical examination with abnormal findings: Secondary | ICD-10-CM

## 2020-10-16 DIAGNOSIS — R300 Dysuria: Secondary | ICD-10-CM

## 2020-10-16 DIAGNOSIS — E1122 Type 2 diabetes mellitus with diabetic chronic kidney disease: Secondary | ICD-10-CM

## 2020-10-16 DIAGNOSIS — F323 Major depressive disorder, single episode, severe with psychotic features: Secondary | ICD-10-CM

## 2020-10-16 DIAGNOSIS — I10 Essential (primary) hypertension: Secondary | ICD-10-CM

## 2020-10-16 DIAGNOSIS — N1831 Chronic kidney disease, stage 3a: Secondary | ICD-10-CM

## 2020-10-20 DIAGNOSIS — M7501 Adhesive capsulitis of right shoulder: Secondary | ICD-10-CM | POA: Diagnosis not present

## 2020-10-22 ENCOUNTER — Other Ambulatory Visit: Payer: Self-pay

## 2020-10-22 ENCOUNTER — Encounter: Payer: Self-pay | Admitting: Emergency Medicine

## 2020-10-22 ENCOUNTER — Emergency Department
Admission: EM | Admit: 2020-10-22 | Discharge: 2020-10-23 | Disposition: A | Discharge status: Other Acute Inpatient Hospital | Payer: Medicare Other | Attending: Emergency Medicine | Admitting: Emergency Medicine

## 2020-10-22 DIAGNOSIS — Z7984 Long term (current) use of oral hypoglycemic drugs: Secondary | ICD-10-CM | POA: Diagnosis not present

## 2020-10-22 DIAGNOSIS — N183 Chronic kidney disease, stage 3 unspecified: Secondary | ICD-10-CM | POA: Insufficient documentation

## 2020-10-22 DIAGNOSIS — E1122 Type 2 diabetes mellitus with diabetic chronic kidney disease: Secondary | ICD-10-CM | POA: Insufficient documentation

## 2020-10-22 DIAGNOSIS — F600 Paranoid personality disorder: Secondary | ICD-10-CM | POA: Diagnosis not present

## 2020-10-22 DIAGNOSIS — E1142 Type 2 diabetes mellitus with diabetic polyneuropathy: Secondary | ICD-10-CM | POA: Insufficient documentation

## 2020-10-22 DIAGNOSIS — R443 Hallucinations, unspecified: Secondary | ICD-10-CM

## 2020-10-22 DIAGNOSIS — Z79899 Other long term (current) drug therapy: Secondary | ICD-10-CM | POA: Diagnosis not present

## 2020-10-22 DIAGNOSIS — I129 Hypertensive chronic kidney disease with stage 1 through stage 4 chronic kidney disease, or unspecified chronic kidney disease: Secondary | ICD-10-CM | POA: Diagnosis not present

## 2020-10-22 DIAGNOSIS — E039 Hypothyroidism, unspecified: Secondary | ICD-10-CM | POA: Insufficient documentation

## 2020-10-22 DIAGNOSIS — E7800 Pure hypercholesterolemia, unspecified: Secondary | ICD-10-CM | POA: Diagnosis present

## 2020-10-22 DIAGNOSIS — Z20822 Contact with and (suspected) exposure to covid-19: Secondary | ICD-10-CM | POA: Diagnosis not present

## 2020-10-22 DIAGNOSIS — I10 Essential (primary) hypertension: Secondary | ICD-10-CM | POA: Diagnosis present

## 2020-10-22 DIAGNOSIS — Z87891 Personal history of nicotine dependence: Secondary | ICD-10-CM | POA: Diagnosis not present

## 2020-10-22 DIAGNOSIS — Z85828 Personal history of other malignant neoplasm of skin: Secondary | ICD-10-CM | POA: Diagnosis not present

## 2020-10-22 DIAGNOSIS — F22 Delusional disorders: Secondary | ICD-10-CM | POA: Diagnosis present

## 2020-10-22 DIAGNOSIS — R440 Auditory hallucinations: Secondary | ICD-10-CM | POA: Diagnosis not present

## 2020-10-22 LAB — SALICYLATE LEVEL: Salicylate Lvl: <7 mg/dL — ABNORMAL LOW (ref 7.0–30.0)

## 2020-10-22 LAB — CBC
HCT: 39.2 % (ref 36.0–46.0)
Hemoglobin: 13.8 g/dL (ref 12.0–15.0)
MCH: 31.4 pg (ref 26.0–34.0)
MCHC: 35.2 g/dL (ref 30.0–36.0)
MCV: 89.1 fL (ref 80.0–100.0)
Platelets: 282 10*3/uL (ref 150–400)
RBC: 4.4 MIL/uL (ref 3.87–5.11)
RDW: 12.2 % (ref 11.5–15.5)
WBC: 7.7 10*3/uL (ref 4.0–10.5)
nRBC: 0 % (ref 0.0–0.2)

## 2020-10-22 LAB — ETHANOL: Alcohol, Ethyl (B): <10 mg/dL (ref <10)

## 2020-10-22 LAB — URINALYSIS, COMPLETE (UACMP) WITH MICROSCOPIC
Bilirubin Urine: NEGATIVE
Glucose, UA: NEGATIVE mg/dL
Hgb urine dipstick: NEGATIVE
Ketones, ur: NEGATIVE mg/dL
Nitrite: NEGATIVE
Protein, ur: NEGATIVE mg/dL
Specific Gravity, Urine: 1.011 (ref 1.005–1.030)
pH: 6 (ref 5.0–8.0)

## 2020-10-22 LAB — COMPREHENSIVE METABOLIC PANEL
ALT: 20 U/L (ref 0–44)
AST: 19 U/L (ref 15–41)
Albumin: 4.6 g/dL (ref 3.5–5.0)
Alkaline Phosphatase: 75 U/L (ref 38–126)
Anion gap: 9 (ref 5–15)
BUN: 22 mg/dL (ref 8–23)
CO2: 26 mmol/L (ref 22–32)
Calcium: 9.4 mg/dL (ref 8.9–10.3)
Chloride: 104 mmol/L (ref 98–111)
Creatinine, Ser: 1 mg/dL (ref 0.44–1.00)
GFR, Estimated: 58 mL/min — ABNORMAL LOW (ref >60)
Glucose, Bld: 122 mg/dL — ABNORMAL HIGH (ref 70–99)
Potassium: 4.1 mmol/L (ref 3.5–5.1)
Sodium: 139 mmol/L (ref 135–145)
Total Bilirubin: 0.7 mg/dL (ref 0.3–1.2)
Total Protein: 7.7 g/dL (ref 6.5–8.1)

## 2020-10-22 LAB — TSH: TSH: 4.296 u[IU]/mL (ref 0.350–4.500)

## 2020-10-22 LAB — URINE DRUG SCREEN, QUALITATIVE (ARMC ONLY)
Amphetamines, Ur Screen: NOT DETECTED
Barbiturates, Ur Screen: NOT DETECTED
Benzodiazepine, Ur Scrn: NOT DETECTED
Cannabinoid 50 Ng, Ur ~~LOC~~: NOT DETECTED
Cocaine Metabolite,Ur ~~LOC~~: NOT DETECTED
MDMA (Ecstasy)Ur Screen: NOT DETECTED
Methadone Scn, Ur: NOT DETECTED
Opiate, Ur Screen: NOT DETECTED
Phencyclidine (PCP) Ur S: NOT DETECTED
Tricyclic, Ur Screen: NOT DETECTED

## 2020-10-22 LAB — RESP PANEL BY RT-PCR (FLU A&B, COVID) ARPGX2
Influenza A by PCR: NEGATIVE
Influenza B by PCR: NEGATIVE
SARS Coronavirus 2 by RT PCR: NEGATIVE

## 2020-10-22 LAB — ACETAMINOPHEN LEVEL: Acetaminophen (Tylenol), Serum: <10 ug/mL — ABNORMAL LOW (ref 10–30)

## 2020-10-22 MED ORDER — MIRTAZAPINE 15 MG PO TBDP
45.0000 mg | ORAL_TABLET | Freq: Every day | ORAL | Status: DC
  Administered 2020-10-22: 45 mg via ORAL
  Filled 2020-10-22 (×2): qty 3

## 2020-10-22 MED ORDER — SERTRALINE HCL 50 MG PO TABS
100.0000 mg | ORAL_TABLET | Freq: Every day | ORAL | Status: DC
  Administered 2020-10-23: 100 mg via ORAL
  Filled 2020-10-22: qty 2

## 2020-10-22 MED ORDER — RISPERIDONE 1 MG PO TABS
3.0000 mg | ORAL_TABLET | Freq: Every day | ORAL | Status: DC
  Administered 2020-10-22: 3 mg via ORAL
  Filled 2020-10-22: qty 3

## 2020-10-22 MED ORDER — GABAPENTIN 300 MG PO CAPS
300.0000 mg | ORAL_CAPSULE | Freq: Every day | ORAL | Status: DC
  Administered 2020-10-22: 300 mg via ORAL
  Filled 2020-10-22: qty 1

## 2020-10-22 MED ORDER — HYDROCHLOROTHIAZIDE 12.5 MG PO CAPS
12.5000 mg | ORAL_CAPSULE | Freq: Every day | ORAL | Status: DC
  Administered 2020-10-23: 12.5 mg via ORAL
  Filled 2020-10-22: qty 1

## 2020-10-22 MED ORDER — LISINOPRIL 10 MG PO TABS
20.0000 mg | ORAL_TABLET | Freq: Every day | ORAL | Status: DC
  Administered 2020-10-23: 20 mg via ORAL
  Filled 2020-10-22: qty 2

## 2020-10-22 MED ORDER — TRAZODONE HCL 50 MG PO TABS
50.0000 mg | ORAL_TABLET | Freq: Every day | ORAL | Status: DC
  Administered 2020-10-22: 50 mg via ORAL
  Filled 2020-10-22: qty 1

## 2020-10-22 MED ORDER — GLIMEPIRIDE 1 MG PO TABS
1.0000 mg | ORAL_TABLET | Freq: Every day | ORAL | Status: DC
  Filled 2020-10-22: qty 1

## 2020-10-22 MED ORDER — ATORVASTATIN CALCIUM 20 MG PO TABS
10.0000 mg | ORAL_TABLET | Freq: Every day | ORAL | Status: DC
  Administered 2020-10-22: 10 mg via ORAL
  Filled 2020-10-22: qty 1

## 2020-10-22 MED ORDER — LATANOPROST 0.005 % OP SOLN
1.0000 [drp] | Freq: Every day | OPHTHALMIC | Status: DC
  Filled 2020-10-22: qty 2.5

## 2020-10-22 MED ORDER — PROPRANOLOL HCL 20 MG PO TABS: 10.0000 mg | ORAL_TABLET | Freq: Two times a day (BID) | ORAL | Status: DC | PRN

## 2020-10-22 NOTE — ED Notes (Signed)
Dr.Clapacs at bedside  

## 2020-10-22 NOTE — ED Notes (Signed)
VOL  SEEN  BY  DR  CLAPACS  PENDING  PLACEMENT
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
## 2020-10-22 NOTE — ED Notes (Signed)
Pt. Was offered sandwich tray and a drink, but refused.
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
## 2020-10-22 NOTE — ED Notes (Addendum)
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

## 2020-10-22 NOTE — ED Provider Notes (Signed)
[facility] Emergency Department Provider Note  ____________________________________________   I have reviewed the triage vital signs and the nursing notes.   HISTORY  Chief Complaint Hallucinations and Paranoid   History limited by: Psychosis   HPI Stacey Boyd is a 75 y.o. female who presents to the emergency department today because of concern for hallucinations. The patient states that she was hearing a voice of a female earlier today. She says she was inside and the voice came from the outside. It did talk about wanting to rape the patient. She states she has heard the voice before and recognized it although she does not know the name of the person. She denies seeing anyone. The patient denies any recent illness.    Records reviewed. Per medical record review patient has a history of DM  Past Medical History:  Diagnosis Date  . Allergy   . Anxiety   . Arthritis    legs  . Cancer ([place])    skin cancer on right shoulder.   . Depression   . Diabetes mellitus without complication ([place])   . Diverticulitis   . Dyspnea    with exertion  . Glaucoma   . Hyperlipidemia   . Hypertension    controlled on meds  . Hypothyroidism   . Liver cyst   . Osteoporosis   . Shoulder pain, left    and arm  . Thyroid disease   . Wrist fracture 2001   right    Patient Active Problem List   Diagnosis Date Noted  . Hepatic cyst 04/23/2020  . Acute recurrent pansinusitis 02/05/2020  . Chronic left shoulder pain 10/31/2018  . Recurrent cold sores 10/31/2018  . Restless leg syndrome 05/31/2018  . Tardive dyskinesia 04/07/2018  . Radiculopathy, sacral and sacrococcygeal region 04/06/2018  . Uncontrolled type 2 diabetes mellitus with hyperglycemia ([place]) 02/10/2018  . Enlarged thyroid 02/10/2018  . Needs flu shot 02/10/2018  . Acquired hypothyroidism 12/29/2017  . Peripheral polyneuropathy 12/04/2017  . Other fatigue 12/04/2017  . Primary insomnia 12/04/2017   . Delusional disorder ([place]) 10/10/2017  . Acute midline low back pain without sciatica 09/09/2017  . Sacral bruising, initial encounter 09/09/2017  . Neoplasm of uncertain behavior of skin 09/09/2017  . Impaired fasting glucose 09/09/2017  . Vitamin D deficiency 09/09/2017  . Diverticulitis of large intestine without bleeding 01/07/2016  . Type 2 diabetes mellitus with stage 3 chronic kidney disease ([place]) 02/18/2015  . HTN, goal below 140/90 02/18/2015  . Osteopenia 11/15/2014  . Encounter for general adult medical examination with abnormal findings 09/24/2014  . Elevated serum creatinine 09/24/2014  . Pain in the coccyx 09/20/2013  . Routine general medical examination at a health care facility 09/20/2013  . Hyperglycemia 03/15/2013  . Essential hypertension, benign 03/15/2013  . Pure hypercholesterolemia 03/15/2013  . OA (osteoarthritis) 03/15/2013  . Glaucoma 03/15/2013    Past Surgical History:  Procedure Laterality Date  . ABDOMINAL HYSTERECTOMY  2005  . APPENDECTOMY  1980  . CARDIAC CATHETERIZATION Left 09/17/2015   Procedure: Left Heart Cath and Coronary Angiography;  Surgeon: Teodoro Spray, MD;  Location: [place] [facility];  Service: Cardiovascular;  Laterality: Left;  . CARPAL TUNNEL RELEASE    . CATARACT EXTRACTION W/PHACO Left 09/25/2019   Procedure: CATARACT EXTRACTION PHACO AND INTRAOCULAR LENS PLACEMENT (IOC) LEFT   4.45  00:36.1;  Surgeon: Birder Robson, MD;  Location: [place];  Service: Ophthalmology;  Laterality: Left;  . CATARACT EXTRACTION W/PHACO Right 10/16/2019  Procedure: CATARACT EXTRACTION PHACO AND INTRAOCULAR LENS PLACEMENT (IOC)right  DIABETIC;  Surgeon: Birder Robson, MD;  Location: [place];  Service: Ophthalmology;  Laterality: Right;  5.29 0:33.7  . COLONOSCOPY WITH PROPOFOL N/A 11/07/2017   Procedure: COLONOSCOPY WITH PROPOFOL;  Surgeon: Jonathon Bellows, MD;  Location: [facility] ENDOS[facility];  Service: Gastroenterology;   Laterality: N/A;  . FOOT SURGERY Left    2 nodules removed  . FRACTURE SURGERY Right    wrist  . WRIST FRACTURE SURGERY Right     Prior to Admission medications   Medication Sig Start Date End Date Taking? Authorizing Provider  Acetaminophen (TYLENOL ARTHRITIS PAIN PO) Take 650-1,300 mg by mouth as needed.    [provider]  atorvastatin (LIPITOR) 10 MG tablet Take 1 tablet (10 mg total) by mouth daily. FOR CHOLESTEROL 03/11/20   Lavera Guise, MD  azelastine (ASTELIN) 0.1 % nasal spray Place 2 sprays into both nostrils 2 (two) times daily. Use in each nostril as directed 03/12/20   Lavera Guise, MD  Cholecalciferol (VITAMIN D3) 5000 units TABS Take 5,000 Units by mouth daily.     [provider]  gabapentin (NEURONTIN) 300 MG capsule Take 1 capsule (300 mg total) by mouth at bedtime. 07/16/20   Salley Scarlet, MD  glimepiride (AMARYL) 1 MG tablet TAKE ONE TAB WITH LUNCH FOR DM Patient taking differently: Take 1 mg by mouth daily with lunch. TAKE ONE TAB WITH LUNCH FOR DM 03/11/20   Lavera Guise, MD  latanoprost (XALATAN) 0.005 % ophthalmic solution Place 1 drop into both eyes at bedtime.    [provider]  lidocaine (LIDODERM) 5 % Place 1 patch onto the skin daily. Remove & Discard patch within 12 hours or as directed by MD 07/16/20   Salley Scarlet, MD  lisinopril-hydrochlorothiazide (ZESTORETIC) 20-12.5 MG tablet Take 2 tablets by mouth daily. 04/30/19   [provider]  mirtazapine (REMERON SOL-TAB) 45 MG disintegrating tablet Take 1 tablet (45 mg total) by mouth at bedtime. 06/20/20   McDonough, Si Gaul, PA-C  Multiple Vitamins-Minerals (SENTRY SENIOR) TABS Take 1 tablet by mouth daily. Eye vitamin    [provider]  propranolol (INDERAL) 10 MG tablet Take 1 tablet (10 mg total) by mouth 2 (two) times daily as needed (anxiety). 07/16/20   Salley Scarlet, MD  risperiDONE (RISPERDAL) 2 MG tablet Take 1 tablet (2 mg total) by mouth at bedtime.  07/16/20 07/16/21  Salley Scarlet, MD  sertraline (ZOLOFT) 100 MG tablet TAKE 1 TABLET BY MOUTH DAILY 10/17/20   Lavera Guise, MD  traZODone (DESYREL) 50 MG tablet Take 0.5-1 tablets (25-50 mg total) by mouth at bedtime as needed for sleep. 06/20/20   McDonough, Si Gaul, PA-C    Allergies Codeine  Family History  Problem Relation Age of Onset  . Heart disease Mother        h/o rheumatic fever  . Heart disease Father   . Stroke Father   . Diabetes Sister   . Diabetes Brother   . Diabetes Sister   . Mental illness Other   . Colon cancer Neg Hx   . Breast cancer Neg Hx     Social History Social History   Tobacco Use  . Smoking status: Former Smoker    Packs/day: 0.25    Years: 10.00    Pack years: 2.50    Types: Cigarettes    Quit date: 09/17/1979    Years since quitting: 41.1  . Smokeless  tobacco: Never Used  Vaping Use  . Vaping Use: Never used  Substance Use Topics  . Alcohol use: No  . Drug use: No    Review of Systems Constitutional: No fever/chills Eyes: No visual changes. ENT: No sore throat. Cardiovascular: Denies chest pain. Respiratory: Denies shortness of breath. Gastrointestinal: No abdominal pain.  No nausea, no vomiting.  No diarrhea.   Genitourinary: Negative for dysuria. Musculoskeletal: Negative for back pain. Skin: Negative for rash. Neurological: Negative for headaches, focal weakness or numbness.  ____________________________________________   PHYSICAL EXAM:  VITAL SIGNS: ED Triage Vitals  Enc Vitals Group     BP 10/22/20 1511 (!) 184/79     Pulse Rate 10/22/20 1511 60     Resp 10/22/20 1511 16     Temp 10/22/20 1511 98.4 F (36.9 C)     Temp Source 10/22/20 1511 Oral     SpO2 10/22/20 1511 98 %     Weight 10/22/20 1512 164 lb (74.4 kg)     Height 10/22/20 1512 5\' 3"  (1.6 m)     Head Circumference --      Peak Flow --      Pain Score 10/22/20 1511 4   Constitutional: Awake and alert.  Eyes: Conjunctivae are normal.  ENT       Head: Normocephalic and atraumatic.      Nose: No congestion/rhinnorhea.      Mouth/Throat: Mucous membranes are moist.      Neck: No stridor. Hematological/Lymphatic/Immunilogical: No cervical lymphadenopathy. Cardiovascular: Normal rate, regular rhythm.  No murmurs, rubs, or gallops.  Respiratory: Normal respiratory effort without tachypnea nor retractions. Breath sounds are clear and equal bilaterally. No wheezes/rales/rhonchi. Gastrointestinal: Soft and non tender. No rebound. No guarding.  Genitourinary: Deferred Musculoskeletal: Normal range of motion in all extremities. No lower extremity edema. Neurologic:  Normal speech and language. No gross focal neurologic deficits are appreciated.  Skin:  Skin is warm, dry and intact. No rash noted. Psychiatric: Paranoia. Does not appear to be responding to internal stimuli.   ____________________________________________    LABS (pertinent positives/negatives)  Acetaminophen, salicylate, ethanol below threshold UA hazy, trace leukocytes, rare bacteria CMP wnl except glu 122 CBC wbc 7.7, hgb 13.8, plt 282 UDS negative ____________________________________________   EKG  None  ____________________________________________    RADIOLOGY  None  ____________________________________________   PROCEDURES  Procedures  ____________________________________________   INITIAL IMPRESSION / ASSESSMENT AND PLAN / ED COURSE  Pertinent labs & imaging results that were available during my care of the patient were reviewed by me and considered in my medical decision making (see chart for details).   Patient presents because of concern for hallucinations and paranoia. Patient was seen by psychiatry. Will plan on referring for geripsych bed.   The patient has been placed in psychiatric observation due to the need to provide a safe environment for the patient while obtaining psychiatric consultation and evaluation, as well as ongoing medical  and medication management to treat the patient's condition.  The patient has not been placed under full IVC at this time.   ____________________________________________   FINAL CLINICAL IMPRESSION(S) / ED DIAGNOSES  Hallucinations  Note: This dictation was prepared with Dragon dictation. Any transcriptional errors that result from this process are unintentional     Nance Pear, MD 10/22/20 1932

## 2020-10-22 NOTE — BH Assessment (Signed)
Adult/GERO MH  Referral information for Psychiatric Hospitalization faxed to:   [facility] ([phone])   [facility] [phone] -or- [phone], [phone])   [place] [phone]), No female unit   [facility] [phone] or [phone]),  . [facility] [phone] -or- [phone]

## 2020-10-22 NOTE — ED Triage Notes (Signed)
Pt comes into the ED via [facility] for psychiatric evaluation.  Pt states that a man is telling her that he is going to rape and kill her and she would rather be done than that be done to her.  Pt admits she doesn't feel safe in her home or going to her sons house because the "man" knows where he lives as well.  Pt thinks the people who live above her in the apartment want to kill her as well.  Delusional and paranoia noticed by [facility].  Pt currently calm and cooperative at this time.

## 2020-10-22 NOTE — ED Notes (Signed)
Pt given warm blanket.

## 2020-10-22 NOTE — BH Assessment (Addendum)
Patient has been accepted to [facility] on tomm 10/23/20 PENDING IVC.   Patient assigned to room [facility] Acute Unit Accepting physician is Dr. Gerrit Halls.  Call report to [phone].  Representative was [employer].   ER Staff is aware of it:  IT trainer  Dr. Archie Balboa, ER MD  [place] Patient's Nurse  TTS left message for patient's son Janene Harvey [phone]) to return call.

## 2020-10-23 DIAGNOSIS — R440 Auditory hallucinations: Secondary | ICD-10-CM | POA: Diagnosis not present

## 2020-10-23 NOTE — ED Notes (Signed)
IVC/ Accepted to [facility] [place]

## 2020-10-23 NOTE — ED Notes (Signed)
Up to bathroom, took mediations. Now eating breakfast waiting for [facility] arrival

## 2020-10-23 NOTE — ED Notes (Signed)
Given  breakfast

## 2020-10-23 NOTE — ED Notes (Signed)
Patient resting all night, vitals to be assessed when patient is awake

## 2020-10-24 DIAGNOSIS — M791 Myalgia, unspecified site: Secondary | ICD-10-CM | POA: Diagnosis not present

## 2020-10-24 DIAGNOSIS — Z79899 Other long term (current) drug therapy: Secondary | ICD-10-CM | POA: Diagnosis not present

## 2020-10-24 DIAGNOSIS — E119 Type 2 diabetes mellitus without complications: Secondary | ICD-10-CM | POA: Diagnosis not present

## 2020-10-24 LAB — URINE CULTURE

## 2020-10-25 DIAGNOSIS — N390 Urinary tract infection, site not specified: Secondary | ICD-10-CM | POA: Diagnosis not present

## 2020-10-25 DIAGNOSIS — M549 Dorsalgia, unspecified: Secondary | ICD-10-CM | POA: Diagnosis not present

## 2020-10-25 DIAGNOSIS — Z712 Person consulting for explanation of examination or test findings: Secondary | ICD-10-CM | POA: Diagnosis not present

## 2020-10-26 DIAGNOSIS — I10 Essential (primary) hypertension: Secondary | ICD-10-CM | POA: Diagnosis not present

## 2020-10-26 DIAGNOSIS — Z712 Person consulting for explanation of examination or test findings: Secondary | ICD-10-CM | POA: Diagnosis not present

## 2020-10-26 DIAGNOSIS — N390 Urinary tract infection, site not specified: Secondary | ICD-10-CM | POA: Diagnosis not present

## 2020-10-26 DIAGNOSIS — R5383 Other fatigue: Secondary | ICD-10-CM | POA: Diagnosis not present

## 2020-10-27 DIAGNOSIS — Z712 Person consulting for explanation of examination or test findings: Secondary | ICD-10-CM | POA: Diagnosis not present

## 2020-10-27 DIAGNOSIS — I10 Essential (primary) hypertension: Secondary | ICD-10-CM | POA: Diagnosis not present

## 2020-10-27 DIAGNOSIS — R5383 Other fatigue: Secondary | ICD-10-CM | POA: Diagnosis not present

## 2020-10-27 DIAGNOSIS — N390 Urinary tract infection, site not specified: Secondary | ICD-10-CM | POA: Diagnosis not present

## 2020-10-28 DIAGNOSIS — E119 Type 2 diabetes mellitus without complications: Secondary | ICD-10-CM | POA: Diagnosis not present

## 2020-10-28 DIAGNOSIS — I10 Essential (primary) hypertension: Secondary | ICD-10-CM | POA: Diagnosis not present

## 2020-10-29 DIAGNOSIS — I10 Essential (primary) hypertension: Secondary | ICD-10-CM | POA: Diagnosis not present

## 2020-10-30 DIAGNOSIS — I10 Essential (primary) hypertension: Secondary | ICD-10-CM | POA: Diagnosis not present

## 2020-10-31 DIAGNOSIS — I10 Essential (primary) hypertension: Secondary | ICD-10-CM | POA: Diagnosis not present

## 2020-11-03 ENCOUNTER — Ambulatory Visit (INDEPENDENT_AMBULATORY_CARE_PROVIDER_SITE_OTHER): Payer: Medicare Other | Admitting: Physician Assistant

## 2020-11-03 ENCOUNTER — Other Ambulatory Visit: Payer: Self-pay

## 2020-11-03 DIAGNOSIS — Z09 Encounter for follow-up examination after completed treatment for conditions other than malignant neoplasm: Secondary | ICD-10-CM | POA: Diagnosis not present

## 2020-11-03 DIAGNOSIS — F323 Major depressive disorder, single episode, severe with psychotic features: Secondary | ICD-10-CM

## 2020-11-03 DIAGNOSIS — I10 Essential (primary) hypertension: Secondary | ICD-10-CM | POA: Diagnosis not present

## 2020-11-03 DIAGNOSIS — R946 Abnormal results of thyroid function studies: Secondary | ICD-10-CM | POA: Diagnosis not present

## 2020-11-03 NOTE — Progress Notes (Signed)
[facility] [place], [place] [address]  Internal MEDICINE  Office Visit Note  Patient Name: Stacey Boyd  [id number]  [id number]  Date of Service: 11/04/2020     Chief Complaint  Patient presents with   Hospitalization Follow-up    Discuss meds and refills   Quality Metric Gaps    Shingrix, foot exam, covid booster      HPI Pt is here for recent hospital follow up. -She was admitted to [facility] from 10/23/20-10/30/20 for psychosis. -Pt feels better and denies any depression, just feels lonely at times. May see about moving in with son. Had a visit from her granddaughter and great grandchildren stopped by yesterday for a vist. -Pt does still admit that she hears voices, but denies any visual hallucinations, SI/HI. -[facility] f/u on Wednesday and will be established there. -pt was thought to be have a UTI, but came back contaminated species and pt does not think she was ever given the macrobid bc of this -also states she was restarted on Synthroid 39mcg during stay however upon reviewing labs she brought from stay--elevated TSH and T4, so instructed to stop synthroid and will check antibodies and thyroid US  Current Medication: Outpatient Encounter Medications as of 11/03/2020  Medication Sig   acetaminophen (TYLENOL) 650 MG CR tablet Take 650-1,300 mg by mouth every 8 (eight) hours as needed for pain.   atorvastatin (LIPITOR) 10 MG tablet Take 1 tablet (10 mg total) by mouth daily. FOR CHOLESTEROL (Patient taking differently: Take 10 mg by mouth daily.)   Cholecalciferol (VITAMIN D3) 5000 units TABS Take 5,000 Units by mouth daily.    dorzolamide-timolol (COSOPT) 22.3-6.8 MG/ML ophthalmic solution Place 1 drop into both eyes 2 (two) times daily.   gabapentin (NEURONTIN) 300 MG capsule Take 1 capsule (300 mg total) by mouth at bedtime.   glimepiride (AMARYL) 1 MG tablet TAKE ONE TAB WITH LUNCH FOR DM (Patient taking differently: Take 1 mg  by mouth daily with lunch.)   latanoprost (XALATAN) 0.005 % ophthalmic solution Place 1 drop into both eyes at bedtime.   levothyroxine (SYNTHROID) 25 MCG tablet Take 25 mcg by mouth daily before breakfast.   mirtazapine (REMERON SOL-TAB) 45 MG disintegrating tablet Take 1 tablet (45 mg total) by mouth at bedtime.   propranolol (INDERAL) 10 MG tablet Take 1 tablet (10 mg total) by mouth 2 (two) times daily as needed (anxiety).   risperiDONE (RISPERDAL) 2 MG tablet Take 1 tablet (2 mg total) by mouth at bedtime.   sertraline (ZOLOFT) 100 MG tablet TAKE 1 TABLET BY MOUTH DAILY (Patient taking differently: Take 100 mg by mouth daily.)   traZODone (DESYREL) 50 MG tablet Take 0.5-1 tablets (25-50 mg total) by mouth at bedtime as needed for sleep.   [DISCONTINUED] azelastine (ASTELIN) 0.1 % nasal spray Place 2 sprays into both nostrils 2 (two) times daily. Use in each nostril as directed (Patient not taking: No sig reported)   [DISCONTINUED] lidocaine (LIDODERM) 5 % Place 1 patch onto the skin daily. Remove & Discard patch within 12 hours or as directed by MD (Patient not taking: No sig reported)   No facility-administered encounter medications on file as of 11/03/2020.    Surgical History: Past Surgical History:  Procedure Laterality Date   ABDOMINAL HYSTERECTOMY  2005   APPENDECTOMY  1980   CARDIAC CATHETERIZATION Left 09/17/2015   Procedure: Left Heart Cath and Coronary Angiography;  Surgeon: Teodoro Spray, MD;  Location: [facility] INVASIVE CV  LAB;  Service: Cardiovascular;  Laterality: Left;   CARPAL TUNNEL RELEASE     CATARACT EXTRACTION W/PHACO Left 09/25/2019   Procedure: CATARACT EXTRACTION PHACO AND INTRAOCULAR LENS PLACEMENT (IOC) LEFT   4.45  00:36.1;  Surgeon: Birder Robson, MD;  Location: [place];  Service: Ophthalmology;  Laterality: Left;   CATARACT EXTRACTION W/PHACO Right 10/16/2019   Procedure: CATARACT EXTRACTION PHACO AND INTRAOCULAR LENS PLACEMENT (IOC)right  DIABETIC;   Surgeon: Birder Robson, MD;  Location: [place];  Service: Ophthalmology;  Laterality: Right;  5.29 0:33.7   COLONOSCOPY WITH PROPOFOL N/A 11/07/2017   Procedure: COLONOSCOPY WITH PROPOFOL;  Surgeon: Jonathon Bellows, MD;  Location: [facility] ENDOSCOPY;  Service: Gastroenterology;  Laterality: N/A;   FOOT SURGERY Left    2 nodules removed   FRACTURE SURGERY Right    wrist   WRIST FRACTURE SURGERY Right     Medical History: Past Medical History:  Diagnosis Date   Allergy    Anxiety    Arthritis    legs   Cancer ([place])    skin cancer on right shoulder.    Depression    Diabetes mellitus without complication ([place])    Diverticulitis    Dyspnea    with exertion   Glaucoma    Hyperlipidemia    Hypertension    controlled on meds   Hypothyroidism    Liver cyst    Osteoporosis    Shoulder pain, left    and arm   Thyroid disease    Wrist fracture 2001   right    Family History: Family History  Problem Relation Age of Onset   Heart disease Mother        h/o rheumatic fever   Heart disease Father    Stroke Father    Diabetes Sister    Diabetes Brother    Diabetes Sister    Mental illness Other    Colon cancer Neg Hx    Breast cancer Neg Hx     Social History   Socioeconomic History   Marital status: Divorced    Spouse name: Not on file   Number of children: 3   Years of education: Not on file   Highest education level: 10th grade  Occupational History   Not on file  Tobacco Use   Smoking status: Former    Packs/day: 0.25    Years: 10.00    Pack years: 2.50    Types: Cigarettes    Quit date: 09/17/1979    Years since quitting: 41.1   Smokeless tobacco: Never  Vaping Use   Vaping Use: Never used  Substance and Sexual Activity   Alcohol use: No   Drug use: No   Sexual activity: Yes    Partners: Male    Birth control/protection: None  Other Topics Concern   Not on file  Social History Narrative   3 kids, local   Lives with daughter as of 2016    Divorced ~2002   Social Determinants of Health   Financial Resource Strain: Not on file  Food Insecurity: Not on file  Transportation Needs: Not on file  Physical Activity: Not on file  Stress: Not on file  Social Connections: Not on file  Intimate Partner Violence: Not on file      Review of Systems  Constitutional:  Negative for chills, fatigue and unexpected weight change.  HENT:  Negative for congestion, rhinorrhea, sneezing and sore throat.   Eyes:  Negative for redness.  Respiratory:  Negative for  cough, chest tightness and shortness of breath.   Cardiovascular:  Negative for chest pain and palpitations.  Gastrointestinal:  Negative for abdominal pain, constipation, diarrhea, nausea and vomiting.  Genitourinary:  Negative for dysuria and frequency.  Musculoskeletal:  Negative for arthralgias, back pain, joint swelling and neck pain.  Skin:  Negative for rash.  Neurological: Negative.  Negative for tremors and numbness.  Hematological:  Negative for adenopathy. Does not bruise/bleed easily.  Psychiatric/Behavioral:  Positive for dysphoric mood and hallucinations. Negative for agitation, confusion, sleep disturbance and suicidal ideas. Behavioral problem: Depression.The patient is not nervous/anxious.    Vital Signs: BP (!) 146/70   Pulse 60   Temp (!) 97.3 F (36.3 C)   Resp 16   Ht 5\' 3"  (1.6 m)   Wt 164 lb (74.4 kg)   SpO2 98%   BMI 29.05 kg/m    Physical Exam Vitals and nursing note reviewed.  Constitutional:      General: She is not in acute distress.    Appearance: She is well-developed. She is not diaphoretic.  HENT:     Head: Normocephalic and atraumatic.     Mouth/Throat:     Pharynx: No oropharyngeal exudate.  Eyes:     Pupils: Pupils are equal, round, and reactive to light.  Neck:     Thyroid: No thyromegaly.     Vascular: No JVD.     Trachea: No tracheal deviation.  Cardiovascular:     Rate and Rhythm: Normal rate and regular rhythm.      Heart sounds: Normal heart sounds. No murmur heard.   No friction rub. No gallop.  Pulmonary:     Effort: Pulmonary effort is normal. No respiratory distress.     Breath sounds: No wheezing or rales.  Chest:     Chest wall: No tenderness.  Abdominal:     General: Bowel sounds are normal.     Palpations: Abdomen is soft.  Musculoskeletal:        General: Normal range of motion.     Cervical back: Normal range of motion and neck supple.  Lymphadenopathy:     Cervical: No cervical adenopathy.  Skin:    General: Skin is warm and dry.  Neurological:     Mental Status: She is alert and oriented to person, place, and time.     Cranial Nerves: No cranial nerve deficit.  Psychiatric:        Behavior: Behavior normal.        Thought Content: Thought content normal.        Judgment: Judgment normal.      Assessment/Plan: 1. Encounter for examination following treatment at hospital Reviewed inpatient stay at behavioral health facility  2. Major depression with psychotic features ([place]) Continue current medications and follow up with psych  3. Abnormal thyroid function test Abnormal labs provided from inpatient stay, hold synthroid and will obtain thyroid antibody labs and thyroid US - US THYROID; Future - Thyroglobulin Level - Thyrotropin receptor autoabs - Thyroid peroxidase antibody  4. Essential hypertension, benign Stable, Continue current therapy   General Counseling: drenda sobecki understanding of the findings of todays visit and agrees with plan of treatment. I have discussed any further diagnostic evaluation that may be needed or ordered today. We also reviewed her medications today. she has been encouraged to call the office with any questions or concerns that should arise related to todays visit.    Counseling:    Orders Placed This Encounter  Procedures   US  THYROID   Thyroglobulin Level   Thyrotropin receptor autoabs   Thyroid peroxidase antibody     This patient was seen by Drema Dallas, PA-C in collaboration with Dr. Clayborn Bigness as a part of collaborative care agreement.   I have reviewed all medical records from hospital follow up including radiology reports and consults from other physicians. Appropriate follow up diagnostics will be scheduled as needed. Patient/ Family understands the plan of treatment. Time spent 40 minutes.   Dr Lavera Guise, MD Internal Medicine

## 2020-11-04 DIAGNOSIS — M25511 Pain in right shoulder: Secondary | ICD-10-CM | POA: Diagnosis not present

## 2020-11-04 DIAGNOSIS — R29898 Other symptoms and signs involving the musculoskeletal system: Secondary | ICD-10-CM | POA: Diagnosis not present

## 2020-11-04 DIAGNOSIS — M25611 Stiffness of right shoulder, not elsewhere classified: Secondary | ICD-10-CM | POA: Diagnosis not present

## 2020-11-05 ENCOUNTER — Other Ambulatory Visit: Payer: Self-pay | Admitting: Physician Assistant

## 2020-11-05 DIAGNOSIS — R440 Auditory hallucinations: Secondary | ICD-10-CM

## 2020-11-05 DIAGNOSIS — F323 Major depressive disorder, single episode, severe with psychotic features: Secondary | ICD-10-CM

## 2020-11-10 ENCOUNTER — Ambulatory Visit (INDEPENDENT_AMBULATORY_CARE_PROVIDER_SITE_OTHER): Payer: Medicare Other | Admitting: Internal Medicine

## 2020-11-10 ENCOUNTER — Ambulatory Visit
Admission: RE | Admit: 2020-11-10 | Discharge: 2020-11-10 | Disposition: A | Discharge status: Home / Self Care | Payer: Medicare Other | Source: Ambulatory Visit | Attending: Internal Medicine | Admitting: Internal Medicine

## 2020-11-10 ENCOUNTER — Other Ambulatory Visit: Payer: Self-pay

## 2020-11-10 ENCOUNTER — Encounter: Payer: Self-pay | Admitting: Internal Medicine

## 2020-11-10 VITALS — BP 129/60 | HR 58 | Temp 97.8°F | Resp 16 | Ht 63.0 in | Wt 162.0 lb

## 2020-11-10 DIAGNOSIS — I700 Atherosclerosis of aorta: Secondary | ICD-10-CM | POA: Insufficient documentation

## 2020-11-10 DIAGNOSIS — Q438 Other specified congenital malformations of intestine: Secondary | ICD-10-CM | POA: Diagnosis not present

## 2020-11-10 DIAGNOSIS — M549 Dorsalgia, unspecified: Secondary | ICD-10-CM

## 2020-11-10 DIAGNOSIS — R1032 Left lower quadrant pain: Secondary | ICD-10-CM | POA: Diagnosis not present

## 2020-11-10 DIAGNOSIS — K7689 Other specified diseases of liver: Secondary | ICD-10-CM | POA: Diagnosis not present

## 2020-11-10 LAB — POCT URINALYSIS DIPSTICK
Bilirubin, UA: NEGATIVE
Blood, UA: NEGATIVE
Glucose, UA: NEGATIVE
Ketones, UA: NEGATIVE
Leukocytes, UA: NEGATIVE
Nitrite, UA: NEGATIVE
Protein, UA: NEGATIVE
Spec Grav, UA: 1.01 (ref 1.010–1.025)
Urobilinogen, UA: 0.2 E.U./dL
pH, UA: 6.5 (ref 5.0–8.0)

## 2020-11-10 MED ORDER — MELOXICAM 15 MG PO TABS: ORAL_TABLET | ORAL | 0 refills | Status: DC

## 2020-11-10 MED ORDER — ZOSTER VAC RECOMB ADJUVANTED 50 MCG/0.5ML IM SUSR: 0.5000 mL | Freq: Once | INTRAMUSCULAR | 0 refills | Status: AC

## 2020-11-10 MED ORDER — IOHEXOL 300 MG/ML  SOLN
100.0000 mL | Freq: Once | INTRAMUSCULAR | Status: AC | PRN
  Administered 2020-11-10: 100 mL via INTRAVENOUS

## 2020-11-10 NOTE — Progress Notes (Signed)
[facility] [place], [place] [address]  Internal MEDICINE  Office Visit Note  Patient Name: Stacey Boyd  [id number]  [id number]  Date of Service: 11/17/2020  Chief Complaint  Patient presents with   Acute Visit    Back pain  left side    Depression   Hypertension    HPI  Pt is having pain since Saturday, left flank pain. Denies any fever or chills.  Pt has h/o diverticulitis, she looks uncomfortable    Current Medication: Outpatient Encounter Medications as of 11/10/2020  Medication Sig   acetaminophen (TYLENOL) 650 MG CR tablet Take 650-1,300 mg by mouth every 8 (eight) hours as needed for pain.   atorvastatin (LIPITOR) 10 MG tablet Take 1 tablet (10 mg total) by mouth daily. FOR CHOLESTEROL (Patient taking differently: Take 10 mg by mouth daily.)   Cholecalciferol (VITAMIN D3) 5000 units TABS Take 5,000 Units by mouth daily.    dorzolamide-timolol (COSOPT) 22.3-6.8 MG/ML ophthalmic solution Place 1 drop into both eyes 2 (two) times daily.   gabapentin (NEURONTIN) 300 MG capsule Take 1 capsule (300 mg total) by mouth at bedtime.   glimepiride (AMARYL) 1 MG tablet TAKE ONE TAB WITH LUNCH FOR DM (Patient taking differently: Take 1 mg by mouth daily with lunch.)   latanoprost (XALATAN) 0.005 % ophthalmic solution Place 1 drop into both eyes at bedtime.   mirtazapine (REMERON SOL-TAB) 45 MG disintegrating tablet Take 1 tablet (45 mg total) by mouth at bedtime.   propranolol (INDERAL) 10 MG tablet Take 1 tablet (10 mg total) by mouth 2 (two) times daily as needed (anxiety).   risperiDONE (RISPERDAL) 2 MG tablet Take 1 tablet (2 mg total) by mouth at bedtime.   sertraline (ZOLOFT) 100 MG tablet TAKE 1 TABLET BY MOUTH DAILY (Patient taking differently: Take 100 mg by mouth daily.)   traZODone (DESYREL) 50 MG tablet Take 0.5-1 tablets (25-50 mg total) by mouth at bedtime as needed for sleep.   [DISCONTINUED] Zoster Vaccine Adjuvanted [facility]) injection  Inject 0.5 mLs into the muscle once.   [EXPIRED] Zoster Vaccine Adjuvanted [facility]) injection Inject 0.5 mLs into the muscle once for 1 dose.   [DISCONTINUED] levothyroxine (SYNTHROID) 25 MCG tablet Take 25 mcg by mouth daily before breakfast. (Patient not taking: Reported on 11/10/2020)   No facility-administered encounter medications on file as of 11/10/2020.    Surgical History: Past Surgical History:  Procedure Laterality Date   ABDOMINAL HYSTERECTOMY  2005   APPENDECTOMY  1980   CARDIAC CATHETERIZATION Left 09/17/2015   Procedure: Left Heart Cath and Coronary Angiography;  Surgeon: Teodoro Spray, MD;  Location: [place] [facility];  Service: Cardiovascular;  Laterality: Left;   CARPAL TUNNEL RELEASE     CATARACT EXTRACTION W/PHACO Left 09/25/2019   Procedure: CATARACT EXTRACTION PHACO AND INTRAOCULAR LENS PLACEMENT (IOC) LEFT   4.45  00:36.1;  Surgeon: Birder Robson, MD;  Location: [place];  Service: Ophthalmology;  Laterality: Left;   CATARACT EXTRACTION W/PHACO Right 10/16/2019   Procedure: CATARACT EXTRACTION PHACO AND INTRAOCULAR LENS PLACEMENT (IOC)right  DIABETIC;  Surgeon: Birder Robson, MD;  Location: [place];  Service: Ophthalmology;  Laterality: Right;  5.29 0:33.7   COLONOSCOPY WITH PROPOFOL N/A 11/07/2017   Procedure: COLONOSCOPY WITH PROPOFOL;  Surgeon: Jonathon Bellows, MD;  Location[facility];  Service: Gastroenterology;  Laterality: N/A;   FOOT SURGERY Left    2 nodules removed   FRACTURE SURGERY Right    wrist   WRIST FRACTURE  SURGERY Right     Medical History: Past Medical History:  Diagnosis Date   Allergy    Anxiety    Arthritis    legs   Cancer ([place])    skin cancer on right shoulder.    Depression    Diabetes mellitus without complication ([place])    Diverticulitis    Dyspnea    with exertion   Glaucoma    Hyperlipidemia    Hypertension    controlled on meds   Hypothyroidism    Liver cyst    Osteoporosis    Shoulder  pain, left    and arm   Thyroid disease    Wrist fracture 2001   right    Family History: Family History  Problem Relation Age of Onset   Heart disease Mother        h/o rheumatic fever   Heart disease Father    Stroke Father    Diabetes Sister    Diabetes Brother    Diabetes Sister    Mental illness Other    Colon cancer Neg Hx    Breast cancer Neg Hx     Social History   Socioeconomic History   Marital status: Divorced    Spouse name: Not on file   Number of children: 3   Years of education: Not on file   Highest education level: 10th grade  Occupational History   Not on file  Tobacco Use   Smoking status: Former    Packs/day: 0.25    Years: 10.00    Pack years: 2.50    Types: Cigarettes    Quit date: 09/17/1979    Years since quitting: 41.1   Smokeless tobacco: Never  Vaping Use   Vaping Use: Never used  Substance and Sexual Activity   Alcohol use: No   Drug use: No   Sexual activity: Yes    Partners: Male    Birth control/protection: None  Other Topics Concern   Not on file  Social History Narrative   3 kids, local   Lives with daughter as of 2016   Divorced ~2002   Social Determinants of Health   Financial Resource Strain: Not on file  Food Insecurity: Not on file  Transportation Needs: Not on file  Physical Activity: Not on file  Stress: Not on file  Social Connections: Not on file  Intimate Partner Violence: Not on file      Review of Systems  Constitutional:  Negative for chills, fatigue and unexpected weight change.  HENT:  Negative for congestion, postnasal drip, rhinorrhea, sneezing and sore throat.   Eyes:  Negative for redness.  Respiratory:  Negative for cough, chest tightness and shortness of breath.   Cardiovascular:  Negative for chest pain and palpitations.  Gastrointestinal:  Negative for abdominal pain, constipation, diarrhea, nausea and vomiting.  Genitourinary:  Negative for dysuria and frequency.  Musculoskeletal:   Negative for arthralgias, back pain, joint swelling and neck pain.  Skin:  Negative for rash.  Neurological: Negative.  Negative for tremors and numbness.  Hematological:  Negative for adenopathy. Does not bruise/bleed easily.  Psychiatric/Behavioral:  Negative for behavioral problems (Depression), sleep disturbance and suicidal ideas. The patient is not nervous/anxious.    Vital Signs: BP 129/60   Pulse (!) 58   Temp 97.8 F (36.6 C)   Resp 16   Ht 5\' 3"  (1.6 m)   Wt 162 lb (73.5 kg)   SpO2 98%   BMI 28.70 kg/m    Physical  Exam Constitutional:      Appearance: Normal appearance.  HENT:     Head: Normocephalic and atraumatic.     Nose: Nose normal.     Mouth/Throat:     Mouth: Mucous membranes are moist.     Pharynx: No posterior oropharyngeal erythema.  Eyes:     Extraocular Movements: Extraocular movements intact.     Pupils: Pupils are equal, round, and reactive to light.  Cardiovascular:     Pulses: Normal pulses.     Heart sounds: Normal heart sounds.  Pulmonary:     Effort: Pulmonary effort is normal.     Breath sounds: Normal breath sounds.  Abdominal:     Tenderness: There is abdominal tenderness. There is rebound.  Neurological:     General: No focal deficit present.     Mental Status: She is alert.  Psychiatric:        Mood and Affect: Mood normal.        Behavior: Behavior normal.       Assessment/Plan: 1. Left-sided back pain, unspecified back location, unspecified chronicity Pt is sent to have STAT CT scan, once the results are available will call with treatment - POCT Urinalysis Dipstick - CT Abdomen Pelvis W Contrast; Future   General Counseling: lesslie mossa understanding of the findings of todays visit and agrees with plan of treatment. I have discussed any further diagnostic evaluation that may be needed or ordered today. We also reviewed her medications today. she has been encouraged to call the office with any questions or concerns that  should arise related to todays visit.    Orders Placed This Encounter  Procedures   CT Abdomen Pelvis W Contrast   POCT Urinalysis Dipstick    Meds ordered this encounter  Medications   Zoster Vaccine Adjuvanted [facility]) injection    Sig: Inject 0.5 mLs into the muscle once for 1 dose.    Dispense:  0.5 mL    Refill:  0    Total time spent:30 Minutes Time spent includes review of chart, medications, test results, and follow up plan with the patient.   [place] Controlled Substance Database was reviewed by me.   Dr Lavera Guise Internal medicine

## 2020-11-10 NOTE — Telephone Encounter (Signed)
LMOM letting pt know CT results were normal and that we sent a prescription for mobic to her pharmacy

## 2020-11-11 ENCOUNTER — Telehealth: Payer: Self-pay

## 2020-11-11 DIAGNOSIS — M25511 Pain in right shoulder: Secondary | ICD-10-CM | POA: Diagnosis not present

## 2020-11-11 NOTE — Telephone Encounter (Signed)
[facility] 11/10/20 advising pt that her CT scan was normal and that we sent Meloxicam to her pharmacy

## 2020-11-13 DIAGNOSIS — M25511 Pain in right shoulder: Secondary | ICD-10-CM | POA: Diagnosis not present

## 2020-11-19 DIAGNOSIS — M25511 Pain in right shoulder: Secondary | ICD-10-CM | POA: Diagnosis not present

## 2020-11-20 DIAGNOSIS — N1831 Chronic kidney disease, stage 3a: Secondary | ICD-10-CM | POA: Diagnosis not present

## 2020-11-20 DIAGNOSIS — E1122 Type 2 diabetes mellitus with diabetic chronic kidney disease: Secondary | ICD-10-CM | POA: Diagnosis not present

## 2020-11-20 DIAGNOSIS — M7501 Adhesive capsulitis of right shoulder: Secondary | ICD-10-CM | POA: Diagnosis not present

## 2020-12-03 ENCOUNTER — Telehealth: Payer: Self-pay

## 2020-12-03 ENCOUNTER — Other Ambulatory Visit: Payer: Medicare Other

## 2020-12-03 NOTE — Telephone Encounter (Signed)
Patient is going to new dentist. She has infection in her mouth. They told her she would need to get antibiotics from her primary. Last seen 11/10/20. Can I put her on schedule for next week?

## 2020-12-04 ENCOUNTER — Other Ambulatory Visit: Payer: Self-pay

## 2020-12-04 MED ORDER — AMOXICILLIN-POT CLAVULANATE 875-125 MG PO TABS: 1.0000 | ORAL_TABLET | Freq: Two times a day (BID) | ORAL | 0 refills | Status: DC

## 2020-12-04 NOTE — Telephone Encounter (Signed)
Patient is going to new dentist. She has infection in her mouth. They told her she would need to get antibiotics from her primary. Per [facility] we sent in Augmentin 875 mg.  Pt notified of meds sent to pharmacy

## 2020-12-05 ENCOUNTER — Telehealth: Payer: Self-pay

## 2020-12-05 NOTE — Telephone Encounter (Signed)
Lvm to confirm 12/10/20 appointment-Stacey Boyd

## 2020-12-09 DIAGNOSIS — H401132 Primary open-angle glaucoma, bilateral, moderate stage: Secondary | ICD-10-CM | POA: Diagnosis not present

## 2020-12-10 ENCOUNTER — Ambulatory Visit (INDEPENDENT_AMBULATORY_CARE_PROVIDER_SITE_OTHER): Payer: Medicare Other

## 2020-12-10 ENCOUNTER — Other Ambulatory Visit: Payer: Self-pay

## 2020-12-10 DIAGNOSIS — R946 Abnormal results of thyroid function studies: Secondary | ICD-10-CM | POA: Diagnosis not present

## 2020-12-18 ENCOUNTER — Ambulatory Visit: Payer: Medicare Other | Admitting: Nurse Practitioner

## 2020-12-23 ENCOUNTER — Telehealth: Payer: Self-pay

## 2020-12-23 NOTE — Telephone Encounter (Signed)
Completed medical records for [facility] administrative coordinator  Mailed to [place] [address] [place] [id number]

## 2020-12-26 ENCOUNTER — Other Ambulatory Visit: Payer: Self-pay

## 2020-12-26 ENCOUNTER — Encounter: Payer: Self-pay | Admitting: Nurse Practitioner

## 2020-12-26 ENCOUNTER — Ambulatory Visit (INDEPENDENT_AMBULATORY_CARE_PROVIDER_SITE_OTHER): Payer: Medicare Other | Admitting: Nurse Practitioner

## 2020-12-26 VITALS — BP 132/68 | HR 65 | Temp 97.1°F | Resp 16 | Ht 63.0 in | Wt 158.2 lb

## 2020-12-26 DIAGNOSIS — F323 Major depressive disorder, single episode, severe with psychotic features: Secondary | ICD-10-CM | POA: Diagnosis not present

## 2020-12-26 DIAGNOSIS — M10071 Idiopathic gout, right ankle and foot: Secondary | ICD-10-CM

## 2020-12-26 DIAGNOSIS — M7989 Other specified soft tissue disorders: Secondary | ICD-10-CM | POA: Diagnosis not present

## 2020-12-26 DIAGNOSIS — M79674 Pain in right toe(s): Secondary | ICD-10-CM

## 2020-12-26 DIAGNOSIS — M109 Gout, unspecified: Secondary | ICD-10-CM

## 2020-12-26 MED ORDER — COLCHICINE 0.6 MG PO TABS: 0.6000 mg | ORAL_TABLET | Freq: Every day | ORAL | 0 refills | Status: DC | PRN

## 2020-12-26 NOTE — Progress Notes (Signed)
[facility] [place], [place] [address]  Internal MEDICINE  Office Visit Note  Patient Name: Stacey Boyd  [id number]  [id number]  Date of Service: 12/26/2020  Chief Complaint  Patient presents with   Acute Visit    SORE, REDNESS, FOOT PAIN, RT, big toe throbs, both feet burn at night, discuss dementia      HPI Stacey Boyd presents for an acute sick visit for right foot pain. The right great toe is sore, red, tender, throbs at night. This started 2 days ago. She denies any injury, cut, pressure sore or wound. She reports increased pain over 1st metatarsal joint. She reports decreased range of motion in that joint.  The pain is constant, 8/10. It became red and swollen over night 2 days ago.  -no history of problems with this toe or foot before. No history of gout.  - depression is under good control   Current Medication:  Outpatient Encounter Medications as of 12/26/2020  Medication Sig   acetaminophen (TYLENOL) 650 MG CR tablet Take 650-1,300 mg by mouth every 8 (eight) hours as needed for pain.   atorvastatin (LIPITOR) 10 MG tablet Take 1 tablet (10 mg total) by mouth daily. FOR CHOLESTEROL (Patient taking differently: Take 10 mg by mouth daily.)   Cholecalciferol (VITAMIN D3) 5000 units TABS Take 5,000 Units by mouth daily.    colchicine 0.6 MG tablet Take 1 tablet (0.6 mg total) by mouth daily as needed (gout flare/attack). On the first day, take 2 tablets then take 1 additional tablet an hour later, then take 1 tablet daily until the gout flare resolves.   dorzolamide-timolol (COSOPT) 22.3-6.8 MG/ML ophthalmic solution Place 1 drop into both eyes 2 (two) times daily.   gabapentin (NEURONTIN) 300 MG capsule Take 1 capsule (300 mg total) by mouth at bedtime.   latanoprost (XALATAN) 0.005 % ophthalmic solution Place 1 drop into both eyes at bedtime.   mirtazapine (REMERON SOL-TAB) 45 MG disintegrating tablet Take 1 tablet (45 mg total) by mouth at bedtime.    propranolol (INDERAL) 10 MG tablet Take 1 tablet (10 mg total) by mouth 2 (two) times daily as needed (anxiety).   traZODone (DESYREL) 50 MG tablet Take 0.5-1 tablets (25-50 mg total) by mouth at bedtime as needed for sleep.   [DISCONTINUED] glimepiride (AMARYL) 1 MG tablet TAKE ONE TAB WITH LUNCH FOR DM (Patient taking differently: Take 1 mg by mouth daily with lunch.)   [DISCONTINUED] sertraline (ZOLOFT) 100 MG tablet TAKE 1 TABLET BY MOUTH DAILY (Patient taking differently: Take 100 mg by mouth daily.)   [DISCONTINUED] amoxicillin-clavulanate (AUGMENTIN) 875-125 MG tablet Take 1 tablet by mouth 2 (two) times daily. (Patient not taking: Reported on 12/26/2020)   [DISCONTINUED] meloxicam (MOBIC) 15 MG tablet Take 1 tablet by mouth every day for 10 days for pain (Patient not taking: Reported on 12/26/2020)   [DISCONTINUED] risperiDONE (RISPERDAL) 2 MG tablet Take 1 tablet (2 mg total) by mouth at bedtime. (Patient not taking: Reported on 12/26/2020)   No facility-administered encounter medications on file as of 12/26/2020.      Medical History: Past Medical History:  Diagnosis Date   Allergy    Anxiety    Arthritis    legs   Cancer ([place])    skin cancer on right shoulder.    Depression    Diabetes mellitus without complication ([place])    Diverticulitis    Dyspnea    with exertion   Glaucoma    Hyperlipidemia  Hypertension    controlled on meds   Hypothyroidism    Liver cyst    Osteoporosis    Shoulder pain, left    and arm   Thyroid disease    Wrist fracture 2001   right     Vital Signs: BP 132/68   Pulse 65   Temp (!) 97.1 F (36.2 C)   Resp 16   Ht '5\' 3"'  (1.6 m)   Wt 158 lb 3.2 oz (71.8 kg)   SpO2 98%   BMI 28.02 kg/m    Review of Systems  Respiratory: Negative.  Negative for cough, chest tightness, shortness of breath and wheezing.   Cardiovascular: Negative.  Negative for chest pain and palpitations.  Gastrointestinal: Negative.   Musculoskeletal:  Positive  for arthralgias (right great toe 1st metatarsal joint).       Tender, swollen metatarsal joint per patient, reports it is painful and throbbing.   Skin: Negative.   Neurological: Negative.  Negative for dizziness, light-headedness and headaches.  Psychiatric/Behavioral: Negative.     Physical Exam Vitals reviewed.  Constitutional:      General: She is not in acute distress.    Appearance: Normal appearance. She is not ill-appearing.  HENT:     Head: Normocephalic and atraumatic.  Cardiovascular:     Rate and Rhythm: Normal rate and regular rhythm.     Pulses:          Dorsalis pedis pulses are 2+ on the right side.       Posterior tibial pulses are 2+ on the right side.  Pulmonary:     Effort: Pulmonary effort is normal. No respiratory distress.  Musculoskeletal:     Right foot: Decreased range of motion. Swelling and tenderness present.     Left foot: Normal.  Feet:     Right foot:     Skin integrity: Erythema and warmth present.     Left foot:     Skin integrity: Skin integrity normal.  Skin:    General: Skin is warm and dry.     Capillary Refill: Capillary refill takes less than 2 seconds.  Neurological:     Mental Status: She is alert and oriented to person, place, and time.  Psychiatric:        Mood and Affect: Mood normal.        Behavior: Behavior normal.      Assessment/Plan: 1. Acute idiopathic gout involving toe of right foot Colchicine prescribed with instructions for treating an acute flare, lab ordered to confirm diagnosis.  - Sed Rate (ESR) - Uric acid - colchicine 0.6 MG tablet; Take 1 tablet (0.6 mg total) by mouth daily as needed (gout flare/attack). On the first day, take 2 tablets then take 1 additional tablet an hour later, then take 1 tablet daily until the gout flare resolves.  Dispense: 30 tablet; Refill: 0  2. Pain and swelling of toe, right Lab ordered.  - Sed Rate (ESR) - Uric acid   3. Major depression with psychotic features  ([place]) Continue Zoloft    General Counseling: Stacey Boyd verbalizes understanding of the findings of todays visit and agrees with plan of treatment. I have discussed any further diagnostic evaluation that may be needed or ordered today. We also reviewed her medications today. she has been encouraged to call the office with any questions or concerns that should arise related to todays visit.    Counseling:    Orders Placed This Encounter  Procedures   Sed Rate (ESR)  Uric acid    Meds ordered this encounter  Medications   colchicine 0.6 MG tablet    Sig: Take 1 tablet (0.6 mg total) by mouth daily as needed (gout flare/attack). On the first day, take 2 tablets then take 1 additional tablet an hour later, then take 1 tablet daily until the gout flare resolves.    Dispense:  30 tablet    Refill:  0    Return if symptoms worsen or fail to improve, for make follow up visit to further discuss dementia. .  [place] Controlled Substance Database was reviewed by me for overdose risk score (ORS)  Time spent:20 Minutes Time spent with patient included reviewing progress notes, labs, imaging studies, and discussing plan for follow up.   This patient was seen by Jonetta Osgood, FNP-C in collaboration with Dr. Clayborn Bigness as a part of collaborative care agreement.  Raynelle Fujikawa R. Valetta Fuller, MSN, FNP-C Internal Medicine

## 2020-12-27 LAB — SEDIMENTATION RATE: Sed Rate: 9 mm/hr (ref 0–40)

## 2020-12-27 LAB — URIC ACID: Uric Acid: 6.2 mg/dL (ref 3.1–7.9)

## 2020-12-30 ENCOUNTER — Other Ambulatory Visit: Payer: Self-pay | Admitting: Internal Medicine

## 2020-12-31 ENCOUNTER — Other Ambulatory Visit: Payer: Self-pay

## 2020-12-31 DIAGNOSIS — N1831 Chronic kidney disease, stage 3a: Secondary | ICD-10-CM

## 2020-12-31 DIAGNOSIS — E1122 Type 2 diabetes mellitus with diabetic chronic kidney disease: Secondary | ICD-10-CM

## 2020-12-31 MED ORDER — GLIMEPIRIDE 1 MG PO TABS: ORAL_TABLET | ORAL | 3 refills | Status: DC

## 2021-01-07 ENCOUNTER — Other Ambulatory Visit: Payer: Self-pay | Admitting: Internal Medicine

## 2021-01-07 DIAGNOSIS — I10 Essential (primary) hypertension: Secondary | ICD-10-CM

## 2021-01-07 DIAGNOSIS — F323 Major depressive disorder, single episode, severe with psychotic features: Secondary | ICD-10-CM

## 2021-01-07 DIAGNOSIS — E1122 Type 2 diabetes mellitus with diabetic chronic kidney disease: Secondary | ICD-10-CM

## 2021-01-07 DIAGNOSIS — Z0001 Encounter for general adult medical examination with abnormal findings: Secondary | ICD-10-CM

## 2021-01-07 DIAGNOSIS — N1831 Chronic kidney disease, stage 3a: Secondary | ICD-10-CM

## 2021-01-07 DIAGNOSIS — R300 Dysuria: Secondary | ICD-10-CM

## 2021-01-07 DIAGNOSIS — G4720 Circadian rhythm sleep disorder, unspecified type: Secondary | ICD-10-CM

## 2021-01-07 DIAGNOSIS — K7689 Other specified diseases of liver: Secondary | ICD-10-CM

## 2021-01-08 DIAGNOSIS — H401130 Primary open-angle glaucoma, bilateral, stage unspecified: Secondary | ICD-10-CM | POA: Diagnosis not present

## 2021-01-08 LAB — HM DIABETES EYE EXAM

## 2021-01-13 ENCOUNTER — Ambulatory Visit (INDEPENDENT_AMBULATORY_CARE_PROVIDER_SITE_OTHER): Payer: Medicare Other | Admitting: Nurse Practitioner

## 2021-01-13 ENCOUNTER — Encounter: Payer: Self-pay | Admitting: Nurse Practitioner

## 2021-01-13 ENCOUNTER — Other Ambulatory Visit: Payer: Self-pay

## 2021-01-13 VITALS — BP 140/80 | HR 62 | Temp 97.3°F | Resp 16 | Ht 63.0 in | Wt 157.4 lb

## 2021-01-13 DIAGNOSIS — R440 Auditory hallucinations: Secondary | ICD-10-CM

## 2021-01-13 DIAGNOSIS — F22 Delusional disorders: Secondary | ICD-10-CM

## 2021-01-13 DIAGNOSIS — E119 Type 2 diabetes mellitus without complications: Secondary | ICD-10-CM

## 2021-01-13 LAB — POCT GLYCOSYLATED HEMOGLOBIN (HGB A1C): Hemoglobin A1C: 6 % — AB (ref 4.0–5.6)
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
## 2021-01-23 ENCOUNTER — Telehealth: Payer: Self-pay

## 2021-01-23 NOTE — Telephone Encounter (Signed)
Completed medical records for department of social services faxed records to [phone]

## 2021-02-02 ENCOUNTER — Telehealth: Payer: Self-pay

## 2021-02-02 NOTE — Telephone Encounter (Signed)
Pt called and LMOM to change her phone number.  I called pt back to confirm and I did change number to main call number in chart

## 2021-02-06 DIAGNOSIS — Z20822 Contact with and (suspected) exposure to covid-19: Secondary | ICD-10-CM | POA: Diagnosis not present

## 2021-02-06 DIAGNOSIS — R450 Nervousness: Secondary | ICD-10-CM | POA: Diagnosis not present

## 2021-02-06 DIAGNOSIS — R5383 Other fatigue: Secondary | ICD-10-CM | POA: Diagnosis not present

## 2021-02-06 DIAGNOSIS — G3184 Mild cognitive impairment, so stated: Secondary | ICD-10-CM | POA: Diagnosis not present

## 2021-02-11 DIAGNOSIS — G3184 Mild cognitive impairment, so stated: Secondary | ICD-10-CM | POA: Diagnosis not present

## 2021-02-12 ENCOUNTER — Ambulatory Visit: Payer: Medicare Other | Admitting: Psychiatry

## 2021-02-17 DIAGNOSIS — G3184 Mild cognitive impairment, so stated: Secondary | ICD-10-CM | POA: Diagnosis not present

## 2021-02-19 DIAGNOSIS — G319 Degenerative disease of nervous system, unspecified: Secondary | ICD-10-CM | POA: Diagnosis not present

## 2021-02-19 DIAGNOSIS — I6782 Cerebral ischemia: Secondary | ICD-10-CM | POA: Diagnosis not present

## 2021-02-26 ENCOUNTER — Inpatient Hospital Stay: Payer: Medicare Other | Admitting: Nurse Practitioner

## 2021-03-02 DIAGNOSIS — G3184 Mild cognitive impairment, so stated: Secondary | ICD-10-CM | POA: Diagnosis not present

## 2021-03-05 ENCOUNTER — Inpatient Hospital Stay: Payer: Medicare Other | Admitting: Internal Medicine

## 2021-03-16 ENCOUNTER — Encounter: Payer: Self-pay | Admitting: Nurse Practitioner

## 2021-03-16 ENCOUNTER — Other Ambulatory Visit: Payer: Self-pay

## 2021-03-16 ENCOUNTER — Ambulatory Visit (INDEPENDENT_AMBULATORY_CARE_PROVIDER_SITE_OTHER): Payer: Medicare Other | Admitting: Nurse Practitioner

## 2021-03-16 VITALS — BP 129/62 | HR 67 | Temp 98.3°F | Resp 16 | Ht 63.0 in | Wt 158.6 lb

## 2021-03-16 DIAGNOSIS — F22 Delusional disorders: Secondary | ICD-10-CM

## 2021-03-16 DIAGNOSIS — R440 Auditory hallucinations: Secondary | ICD-10-CM | POA: Diagnosis not present

## 2021-03-16 DIAGNOSIS — R300 Dysuria: Secondary | ICD-10-CM | POA: Diagnosis not present

## 2021-03-16 LAB — POCT URINALYSIS DIPSTICK
Bilirubin, UA: NEGATIVE
Blood, UA: NEGATIVE
Glucose, UA: NEGATIVE
Leukocytes, UA: NEGATIVE
Nitrite, UA: NEGATIVE
Protein, UA: NEGATIVE
Spec Grav, UA: 1.015 (ref 1.010–1.025)
Urobilinogen, UA: 0.2 E.U./dL
pH, UA: 5 (ref 5.0–8.0)
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
## 2021-04-15 ENCOUNTER — Ambulatory Visit: Payer: Medicare Other | Admitting: Nurse Practitioner

## 2021-05-22 ENCOUNTER — Other Ambulatory Visit: Payer: Self-pay | Admitting: Physician Assistant

## 2021-05-22 DIAGNOSIS — K7689 Other specified diseases of liver: Secondary | ICD-10-CM

## 2021-05-22 DIAGNOSIS — R300 Dysuria: Secondary | ICD-10-CM

## 2021-05-22 DIAGNOSIS — E1122 Type 2 diabetes mellitus with diabetic chronic kidney disease: Secondary | ICD-10-CM

## 2021-05-22 DIAGNOSIS — Z0001 Encounter for general adult medical examination with abnormal findings: Secondary | ICD-10-CM

## 2021-05-22 DIAGNOSIS — I10 Essential (primary) hypertension: Secondary | ICD-10-CM

## 2021-05-22 DIAGNOSIS — F323 Major depressive disorder, single episode, severe with psychotic features: Secondary | ICD-10-CM

## 2021-05-22 DIAGNOSIS — G4720 Circadian rhythm sleep disorder, unspecified type: Secondary | ICD-10-CM

## 2021-06-01 ENCOUNTER — Other Ambulatory Visit: Payer: Self-pay | Admitting: Internal Medicine

## 2021-06-02 NOTE — Telephone Encounter (Signed)
Med sent to pharmacy.

## 2021-06-10 ENCOUNTER — Other Ambulatory Visit: Payer: Self-pay | Admitting: Internal Medicine

## 2021-06-10 DIAGNOSIS — E782 Mixed hyperlipidemia: Secondary | ICD-10-CM

## 2021-06-26 ENCOUNTER — Other Ambulatory Visit: Payer: Self-pay

## 2021-06-26 ENCOUNTER — Encounter: Payer: Self-pay | Admitting: Nurse Practitioner

## 2021-06-26 ENCOUNTER — Ambulatory Visit (INDEPENDENT_AMBULATORY_CARE_PROVIDER_SITE_OTHER): Payer: Medicare Other | Admitting: Nurse Practitioner

## 2021-06-26 VITALS — BP 132/62 | HR 65 | Temp 98.1°F | Resp 16 | Ht 63.0 in | Wt 164.2 lb

## 2021-06-26 DIAGNOSIS — E119 Type 2 diabetes mellitus without complications: Secondary | ICD-10-CM | POA: Diagnosis not present

## 2021-06-26 DIAGNOSIS — F22 Delusional disorders: Secondary | ICD-10-CM | POA: Diagnosis not present

## 2021-06-26 DIAGNOSIS — I10 Essential (primary) hypertension: Secondary | ICD-10-CM

## 2021-06-26 DIAGNOSIS — R300 Dysuria: Secondary | ICD-10-CM | POA: Diagnosis not present

## 2021-06-26 DIAGNOSIS — Z0001 Encounter for general adult medical examination with abnormal findings: Secondary | ICD-10-CM | POA: Diagnosis not present

## 2021-06-26 DIAGNOSIS — F323 Major depressive disorder, single episode, severe with psychotic features: Secondary | ICD-10-CM

## 2021-06-26 LAB — POCT GLYCOSYLATED HEMOGLOBIN (HGB A1C): Hemoglobin A1C: 5.8 % — AB (ref 4.0–5.6)

## 2021-06-26 NOTE — Progress Notes (Cosign Needed)
[facility] [place], [place] [address]  Internal MEDICINE  Office Visit Note  Patient Name: Stacey Boyd  [id number]  [id number]  Date of Service: 06/26/2021  Chief Complaint  Patient presents with   Medicare Wellness   Anxiety   Depression   Hyperlipidemia   Hypertension   Diabetes    HPI [name] presents for an annual well visit and physical exam.  She is a well-appearing 76 year old female with hypertension, diabetes, hypothyroidism, osteopenia, and delusional disorder and a history of auditory hallucinations.  Her A1c is 5.8 which is stable and slightly improved from 6.0 in August last year.  She is due for the second dose of shingles vaccine and is planning to get that vaccination scheduled at her pharmacy.  She denies having any hallucinations since her previous office visit.  She has not been hospitalized since her previous office visit.  She continues to take her medications as prescribed including the psychotropic medications.  She reports that she has had decreased anxiety and decreased paranoid thoughts.  She is due for colonoscopy in June 2024.  She is due for diabetic eye exam in August this year.  She had her DEXA scan in 2016.   Current Medication: Outpatient Encounter Medications as of 06/26/2021  Medication Sig   acetaminophen (TYLENOL) 650 MG CR tablet Take 650-1,300 mg by mouth every 8 (eight) hours as needed for pain.   atorvastatin (LIPITOR) 10 MG tablet TAKE 1 TABLET BY MOUTH AT BEDTIME FOR CHOLESTEROL   Cholecalciferol (VITAMIN D3) 5000 units TABS Take 5,000 Units by mouth daily.    colchicine 0.6 MG tablet Take 1 tablet (0.6 mg total) by mouth daily as needed (gout flare/attack). On the first day, take 2 tablets then take 1 additional tablet an hour later, then take 1 tablet daily until the gout flare resolves.   dorzolamide-timolol (COSOPT) 22.3-6.8 MG/ML ophthalmic solution Place 1 drop into both eyes 2 (two) times daily.    glimepiride (AMARYL) 1 MG tablet TAKE ONE TAB WITH LUNCH FOR DM   latanoprost (XALATAN) 0.005 % ophthalmic solution Place 1 drop into both eyes at bedtime.   mirtazapine (REMERON) 15 MG tablet TAKE 1 TABLET BY MOUTH AT BEDTIME FOR DEPRESSION   risperiDONE (RISPERDAL M-TABS) 2 MG disintegrating tablet TAKE ONE TABLET BY MOUTH NIGHTLY   sertraline (ZOLOFT) 100 MG tablet Take by mouth.   traZODone (DESYREL) 50 MG tablet TAKE 0.5-1 TABLET BY MOUTH AT BEDTIME AS NEEDED FOR SLEEP   [DISCONTINUED] propranolol (INDERAL) 10 MG tablet Take 1 tablet (10 mg total) by mouth 2 (two) times daily as needed (anxiety).   No facility-administered encounter medications on file as of 06/26/2021.    Surgical History: Past Surgical History:  Procedure Laterality Date   ABDOMINAL HYSTERECTOMY  2005   APPENDECTOMY  1980   CARDIAC CATHETERIZATION Left 09/17/2015   Procedure: Left Heart Cath and Coronary Angiography;  Surgeon: Teodoro Spray, MD;  Location: [facility];  Service: Cardiovascular;  Laterality: Left;   CARPAL TUNNEL RELEASE     CATARACT EXTRACTION W/PHACO Left 09/25/2019   Procedure: CATARACT EXTRACTION PHACO AND INTRAOCULAR LENS PLACEMENT (IOC) LEFT   4.45  00:36.1;  Surgeon: Birder Robson, MD;  Location: [facility];  Service: Ophthalmology;  Laterality: Left;   CATARACT EXTRACTION W/PHACO Right 10/16/2019   Procedure: CATARACT EXTRACTION PHACO AND INTRAOCULAR LENS PLACEMENT (IOC)right  DIABETIC;  Surgeon: Birder Robson, MD;  Location: [facility];  Service: Ophthalmology;  Laterality: Right;  5.29 0:33.7   COLONOSCOPY WITH PROPOFOL N/A 11/07/2017   Procedure: COLONOSCOPY WITH PROPOFOL;  Surgeon: Jonathon Bellows, MD;  Location: [facility];  Service: Gastroenterology;  Laterality: N/A;   FOOT SURGERY Left    2 nodules removed   FRACTURE SURGERY Right    wrist   WRIST FRACTURE SURGERY Right     Medical History: Past Medical History:  Diagnosis Date   Allergy     Anxiety    Arthritis    legs   Cancer ([place])    skin cancer on right shoulder.    Depression    Diabetes mellitus without complication ([place])    Diverticulitis    Dyspnea    with exertion   Glaucoma    Hyperlipidemia    Hypertension    controlled on meds   Hypothyroidism    Liver cyst    Osteoporosis    Shoulder pain, left    and arm   Thyroid disease    Wrist fracture 2001   right    Family History: Family History  Problem Relation Age of Onset   Heart disease Mother        h/o rheumatic fever   Heart disease Father    Stroke Father    Diabetes Sister    Diabetes Brother    Diabetes Sister    Mental illness Other    Colon cancer Neg Hx    Breast cancer Neg Hx     Social History   Socioeconomic History   Marital status: Divorced    Spouse name: Not on file   Number of children: 3   Years of education: Not on file   Highest education level: 10th grade  Occupational History   Not on file  Tobacco Use   Smoking status: Former    Packs/day: 0.25    Years: 10.00    Pack years: 2.50    Types: Cigarettes    Quit date: 09/17/1979    Years since quitting: 41.8   Smokeless tobacco: Never  Vaping Use   Vaping Use: Never used  Substance and Sexual Activity   Alcohol use: No   Drug use: No   Sexual activity: Yes    Partners: Male    Birth control/protection: None  Other Topics Concern   Not on file  Social History Narrative   3 kids, local   Lives with daughter as of 2016   Divorced ~2002   Social Determinants of Health   Financial Resource Strain: Not on file  Food Insecurity: Not on file  Transportation Needs: Not on file  Physical Activity: Not on file  Stress: Not on file  Social Connections: Not on file  Intimate Partner Violence: Not on file      Review of Systems  Constitutional:  Negative for activity change, appetite change, chills, fatigue, fever and unexpected weight change.  HENT: Negative.  Negative for congestion, ear pain,  rhinorrhea, sneezing, sore throat and trouble swallowing.   Eyes: Negative.  Negative for redness.  Respiratory: Negative.  Negative for cough, chest tightness, shortness of breath and wheezing.   Cardiovascular: Negative.  Negative for chest pain and palpitations.  Gastrointestinal: Negative.  Negative for abdominal pain, blood in stool, constipation, diarrhea, nausea and vomiting.  Endocrine: Negative.   Genitourinary: Negative.  Negative for difficulty urinating, dysuria, frequency, hematuria and urgency.  Musculoskeletal: Negative.  Negative for arthralgias, back pain, joint swelling, myalgias and neck pain.  Skin: Negative.  Negative for rash and wound.  Allergic/Immunologic: Negative.  Negative for immunocompromised state.  Neurological: Negative.  Negative for dizziness, tremors, seizures, numbness and headaches.  Hematological: Negative.  Negative for adenopathy. Does not bruise/bleed easily.  Psychiatric/Behavioral:  Positive for depression. Negative for behavioral problems (Depression), self-injury, sleep disturbance and suicidal ideas. The patient is not nervous/anxious.    Vital Signs: BP 132/62    Pulse 65    Temp 98.1 F (36.7 C)    Resp 16    Ht 5\' 3"  (1.6 m)    Wt 164 lb 3.2 oz (74.5 kg)    SpO2 97%    BMI 29.09 kg/m    Physical Exam Vitals reviewed.  Constitutional:      General: She is not in acute distress.    Appearance: Normal appearance. She is well-developed and normal weight. She is not ill-appearing or diaphoretic.  HENT:     Head: Normocephalic and atraumatic.     Right Ear: Tympanic membrane, ear canal and external ear normal.     Left Ear: Tympanic membrane, ear canal and external ear normal.     Nose: Nose normal. No congestion or rhinorrhea.     Mouth/Throat:     Mouth: Mucous membranes are moist.     Pharynx: Oropharynx is clear. No oropharyngeal exudate or posterior oropharyngeal erythema.  Eyes:     General: No scleral icterus.       Right eye: No  discharge.        Left eye: No discharge.     Extraocular Movements: Extraocular movements intact.     Conjunctiva/sclera: Conjunctivae normal.     Pupils: Pupils are equal, round, and reactive to light.  Neck:     Thyroid: No thyromegaly.     Vascular: No JVD.     Trachea: No tracheal deviation.  Cardiovascular:     Rate and Rhythm: Normal rate and regular rhythm.     Pulses:          Dorsalis pedis pulses are 2+ on the right side and 2+ on the left side.       Posterior tibial pulses are 2+ on the right side and 2+ on the left side.     Heart sounds: Normal heart sounds. No murmur heard.   No friction rub. No gallop.  Pulmonary:     Effort: Pulmonary effort is normal. No respiratory distress.     Breath sounds: Normal breath sounds. No stridor. No wheezing or rales.  Chest:     Chest wall: No tenderness.  Abdominal:     General: Bowel sounds are normal. There is no distension.     Palpations: Abdomen is soft. There is no mass.     Tenderness: There is no abdominal tenderness. There is no guarding or rebound.  Musculoskeletal:        General: No tenderness or deformity. Normal range of motion.     Cervical back: Normal range of motion and neck supple.     Right foot: Normal range of motion. No deformity, bunion, Charcot foot, foot drop or prominent metatarsal heads.     Left foot: Normal range of motion. No deformity, bunion, Charcot foot, foot drop or prominent metatarsal heads.  Feet:     Right foot:     Protective Sensation: 6 sites tested.  6 sites sensed.     Skin integrity: Dry skin present. No ulcer, blister, skin breakdown, erythema, warmth, callus or fissure.     Toenail Condition: Right toenails are normal.     Left foot:  Protective Sensation: 6 sites tested.  6 sites sensed.     Skin integrity: Dry skin present. No ulcer, blister, skin breakdown, erythema, warmth, callus or fissure.     Toenail Condition: Left toenails are normal.  Lymphadenopathy:      Cervical: No cervical adenopathy.  Skin:    General: Skin is warm and dry.     Coloration: Skin is not pale.     Findings: No erythema or rash.  Neurological:     Mental Status: She is alert and oriented to person, place, and time.     Cranial Nerves: No cranial nerve deficit.     Motor: No abnormal muscle tone.     Coordination: Coordination normal.     Gait: Gait normal.     Deep Tendon Reflexes: Reflexes are normal and symmetric.  Psychiatric:        Attention and Perception: Perception normal. She does not perceive auditory or visual hallucinations.        Mood and Affect: Mood and affect normal.        Behavior: Behavior normal. Behavior is cooperative.        Thought Content: Thought content normal. Thought content is not paranoid or delusional.        Judgment: Judgment normal.       Assessment/Plan: 1. Encounter for general adult medical examination with abnormal findings Age-appropriate preventive screenings and vaccinations discussed, annual physical exam completed. Routine labs for health maintenance not ordered, patient has had a lot of labs drawn in the past few months. PHM updated.   2. Essential hypertension, benign Blood pressure is stable with current medications.   3. Type 2 diabetes mellitus without complication, without long-term current use of insulin (HCC) A1C is 5.8 and stable, no changes, repeat A1C in 3 months.  - POCT HgB A1C  4. Dysuria Routine urinalysis done - UA/M w/rflx Culture, Routine - Microscopic Examination - Urine Culture, Reflex  5. Delusional disorder ([place]) Taking current medications, no delusions or hallucinations at this time.   6. Major depression with psychotic features ([place]) Stable on current medication.      General Counseling: aris moman understanding of the findings of todays visit and agrees with plan of treatment. I have discussed any further diagnostic evaluation that may be needed or ordered today. We also  reviewed her medications today. she has been encouraged to call the office with any questions or concerns that should arise related to todays visit.    Orders Placed This Encounter  Procedures   UA/M w/rflx Culture, Routine   POCT HgB A1C    No orders of the defined types were placed in this encounter.   Return in about 3 months (around 09/23/2021) for F/U, Recheck A1C, Kyannah Climer PCP.   Total time spent:30 Minutes Time spent includes review of chart, medications, test results, and follow up plan with the patient.   [place] Controlled Substance Database was reviewed by me.  This patient was seen by Jonetta Osgood, FNP-C in collaboration with Dr. Clayborn Bigness as a part of collaborative care agreement.  Jemeka Wagler R. Valetta Fuller, MSN, FNP-C Internal medicine

## 2021-06-30 ENCOUNTER — Telehealth: Payer: Self-pay | Admitting: Internal Medicine

## 2021-06-30 NOTE — Telephone Encounter (Signed)
Pt called back and has been scheduled for 3 pm on 07/07/21 for her covid vaccine!

## 2021-06-30 NOTE — Telephone Encounter (Signed)
Called to offer pt the new covid vaccine.  Pt would love to get, however she has glaucoma And cannot drive. Pt is going to call the people who take her to her appointments and see if they will take her for an appointment [facility] pharmacy. She is going to me back.

## 2021-07-01 LAB — URINE CULTURE, REFLEX

## 2021-07-01 LAB — UA/M W/RFLX CULTURE, ROUTINE
Bilirubin, UA: NEGATIVE
Glucose, UA: NEGATIVE
Ketones, UA: NEGATIVE
Nitrite, UA: NEGATIVE
Protein,UA: NEGATIVE
RBC, UA: NEGATIVE
Specific Gravity, UA: 1.019 (ref 1.005–1.030)
Urobilinogen, Ur: 0.2 mg/dL (ref 0.2–1.0)
pH, UA: 5.5 (ref 5.0–7.5)

## 2021-07-01 LAB — MICROSCOPIC EXAMINATION
Bacteria, UA: NONE SEEN
Casts: NONE SEEN /lpf
RBC, Urine: NONE SEEN /hpf (ref 0–2)

## 2021-07-07 ENCOUNTER — Ambulatory Visit: Payer: Medicare Other | Attending: Internal Medicine

## 2021-07-07 ENCOUNTER — Other Ambulatory Visit: Payer: Self-pay

## 2021-07-07 DIAGNOSIS — Z23 Encounter for immunization: Secondary | ICD-10-CM

## 2021-07-07 MED ORDER — PFIZER COVID-19 VAC BIVALENT 30 MCG/0.3ML IM SUSP
INTRAMUSCULAR | 0 refills | Status: DC
  Filled 2021-07-07: qty 0.3, 1d supply, fill #0

## 2021-07-07 NOTE — Progress Notes (Signed)
° °  Covid-19 Vaccination Clinic  Name:  Stacey Boyd    MRN: [id number] DOB: 1946/03/18  07/07/2021  Stacey Boyd was observed post Covid-19 immunization for 15 minutes without incident. She was provided with Vaccine Information Sheet and instruction to access the V-Safe system.   Stacey Boyd was instructed to call 911 with any severe reactions post vaccine: Difficulty breathing  Swelling of face and throat  A fast heartbeat  A bad rash all over body  Dizziness and weakness   Immunizations Administered     Name Date Dose VIS Date Route   Pfizer Covid-19 Vaccine Bivalent Booster 07/07/2021  3:20 PM 0.3 mL 01/14/2021 Intramuscular   Manufacturer: [place]   Lot: [id number]   [place]: [phone]

## 2021-07-13 ENCOUNTER — Other Ambulatory Visit: Payer: Self-pay | Admitting: Internal Medicine

## 2021-07-13 DIAGNOSIS — H401132 Primary open-angle glaucoma, bilateral, moderate stage: Secondary | ICD-10-CM | POA: Diagnosis not present

## 2021-07-26 ENCOUNTER — Encounter: Payer: Self-pay | Admitting: Nurse Practitioner

## 2021-08-19 ENCOUNTER — Other Ambulatory Visit: Payer: Self-pay | Admitting: Nurse Practitioner

## 2021-08-25 ENCOUNTER — Other Ambulatory Visit: Payer: Self-pay | Admitting: Physician Assistant

## 2021-08-25 DIAGNOSIS — K7689 Other specified diseases of liver: Secondary | ICD-10-CM

## 2021-08-25 DIAGNOSIS — F323 Major depressive disorder, single episode, severe with psychotic features: Secondary | ICD-10-CM

## 2021-08-25 DIAGNOSIS — R300 Dysuria: Secondary | ICD-10-CM

## 2021-08-25 DIAGNOSIS — G4720 Circadian rhythm sleep disorder, unspecified type: Secondary | ICD-10-CM

## 2021-08-25 DIAGNOSIS — E1122 Type 2 diabetes mellitus with diabetic chronic kidney disease: Secondary | ICD-10-CM

## 2021-08-25 DIAGNOSIS — I10 Essential (primary) hypertension: Secondary | ICD-10-CM

## 2021-08-25 DIAGNOSIS — Z0001 Encounter for general adult medical examination with abnormal findings: Secondary | ICD-10-CM

## 2021-09-25 ENCOUNTER — Ambulatory Visit (INDEPENDENT_AMBULATORY_CARE_PROVIDER_SITE_OTHER): Payer: Medicare Other | Admitting: Nurse Practitioner

## 2021-09-25 ENCOUNTER — Encounter: Payer: Self-pay | Admitting: Nurse Practitioner

## 2021-09-25 VITALS — BP 129/64 | HR 95 | Temp 98.3°F | Resp 16 | Ht 63.0 in | Wt 167.6 lb

## 2021-09-25 DIAGNOSIS — I10 Essential (primary) hypertension: Secondary | ICD-10-CM

## 2021-09-25 DIAGNOSIS — E1122 Type 2 diabetes mellitus with diabetic chronic kidney disease: Secondary | ICD-10-CM

## 2021-09-25 DIAGNOSIS — F22 Delusional disorders: Secondary | ICD-10-CM

## 2021-09-25 DIAGNOSIS — I700 Atherosclerosis of aorta: Secondary | ICD-10-CM

## 2021-09-25 DIAGNOSIS — Z0001 Encounter for general adult medical examination with abnormal findings: Secondary | ICD-10-CM

## 2021-09-25 DIAGNOSIS — F5104 Psychophysiologic insomnia: Secondary | ICD-10-CM | POA: Diagnosis not present

## 2021-09-25 DIAGNOSIS — F323 Major depressive disorder, single episode, severe with psychotic features: Secondary | ICD-10-CM

## 2021-09-25 DIAGNOSIS — E782 Mixed hyperlipidemia: Secondary | ICD-10-CM

## 2021-09-25 DIAGNOSIS — G4720 Circadian rhythm sleep disorder, unspecified type: Secondary | ICD-10-CM

## 2021-09-25 DIAGNOSIS — E119 Type 2 diabetes mellitus without complications: Secondary | ICD-10-CM | POA: Diagnosis not present

## 2021-09-25 DIAGNOSIS — R300 Dysuria: Secondary | ICD-10-CM

## 2021-09-25 DIAGNOSIS — E1165 Type 2 diabetes mellitus with hyperglycemia: Secondary | ICD-10-CM

## 2021-09-25 DIAGNOSIS — K7689 Other specified diseases of liver: Secondary | ICD-10-CM

## 2021-09-25 LAB — POCT GLYCOSYLATED HEMOGLOBIN (HGB A1C): Hemoglobin A1C: 5.9 % — AB (ref 4.0–5.6)

## 2021-09-25 MED ORDER — ASPIRIN EC 81 MG PO TBEC: 81.0000 mg | DELAYED_RELEASE_TABLET | Freq: Every day | ORAL | Status: AC

## 2021-09-25 MED ORDER — EQL FLAXSEED OIL 1200 MG PO CAPS: 1.0000 | ORAL_CAPSULE | Freq: Every day | ORAL | Status: AC

## 2021-09-25 MED ORDER — TRAZODONE HCL 50 MG PO TABS: ORAL_TABLET | ORAL | 3 refills | Status: DC

## 2021-09-25 MED ORDER — RISPERIDONE 2 MG PO TBDP: 2.0000 mg | ORAL_TABLET | Freq: Every day | ORAL | 3 refills | Status: DC

## 2021-09-25 MED ORDER — GLIMEPIRIDE 1 MG PO TABS: ORAL_TABLET | ORAL | 3 refills | Status: DC

## 2021-09-25 MED ORDER — ATORVASTATIN CALCIUM 10 MG PO TABS: ORAL_TABLET | ORAL | 1 refills | Status: DC

## 2021-09-25 MED ORDER — MIRTAZAPINE 15 MG PO TABS: 15.0000 mg | ORAL_TABLET | Freq: Every day | ORAL | 3 refills | Status: DC

## 2021-09-25 MED ORDER — SERTRALINE HCL 100 MG PO TABS: 100.0000 mg | ORAL_TABLET | Freq: Every day | ORAL | 5 refills | Status: DC

## 2021-09-25 NOTE — Progress Notes (Signed)
[facility] [place], [place] [address]  Internal MEDICINE  Office Visit Note  Patient Name: Stacey Boyd  [id number]  [id number]  Date of Service: 09/25/2021  Chief Complaint  Patient presents with   Follow-up   Depression   Diabetes   Hyperlipidemia   Quality Metric Gaps    Shingles Vaccine    HPI Stacey Boyd presents for follow-up visit for hyperlipidemia, diabetes, depression and medication refills.  Her A1c is 5.9 today which remained stable when compared to her last few A1c results.  Her medications for diabetes are minimal and she does well maintaining her levels with her current medications.  She is continuing to feel clearheaded and stable and she is in good spirits today.  She was visited by the insurance company's nurse practitioner for a home visit and maintenance aspirin dose was recommended to her as well as omega-3 fish oil supplement.  Her blood pressure and vital signs are stable within normal limits today.  She continues to report compliance with her medication regimen and is now requesting to have her medications sent in 90-day prescriptions to [facility] to have her medications sent to her home because it is difficult for her to get out as often as she needs to to pick up her medications from the pharmacy.    Current Medication: Outpatient Encounter Medications as of 09/25/2021  Medication Sig   acetaminophen (TYLENOL) 650 MG CR tablet Take 650-1,300 mg by mouth every 8 (eight) hours as needed for pain.   aspirin EC 81 MG tablet Take 1 tablet (81 mg total) by mouth daily. Swallow whole. Please get OTC.   Cholecalciferol (VITAMIN D3) 5000 units TABS Take 5,000 Units by mouth daily.    colchicine 0.6 MG tablet Take 1 tablet (0.6 mg total) by mouth daily as needed (gout flare/attack). On the first day, take 2 tablets then take 1 additional tablet an hour later, then take 1 tablet daily until the gout flare resolves.   COVID-19 mRNA bivalent  vaccine, Pfizer, (PFIZER COVID-19 VAC BIVALENT) injection Inject into the muscle.   dorzolamide-timolol (COSOPT) 22.3-6.8 MG/ML ophthalmic solution Place 1 drop into both eyes 2 (two) times daily.   Flaxseed, Linseed, (EQL FLAXSEED OIL) 1200 MG CAPS Take 1 capsule by mouth daily. Please get OTC   latanoprost (XALATAN) 0.005 % ophthalmic solution Place 1 drop into both eyes at bedtime.   [DISCONTINUED] atorvastatin (LIPITOR) 10 MG tablet TAKE 1 TABLET BY MOUTH AT BEDTIME FOR CHOLESTEROL   [DISCONTINUED] glimepiride (AMARYL) 1 MG tablet TAKE ONE TAB WITH LUNCH FOR DM   [DISCONTINUED] mirtazapine (REMERON) 15 MG tablet TAKE 1 TABLET BY MOUTH AT BEDTIME FOR DEPRESSION   [DISCONTINUED] risperiDONE (RISPERDAL M-TABS) 2 MG disintegrating tablet TAKE ONE TABLET BY MOUTH NIGHTLY   [DISCONTINUED] sertraline (ZOLOFT) 100 MG tablet Take by mouth.   [DISCONTINUED] traZODone (DESYREL) 50 MG tablet TAKE 0.5-1 TABLET BY MOUTH AT BEDTIME AS NEEDED FOR SLEEP   atorvastatin (LIPITOR) 10 MG tablet TAKE 1 TABLET BY MOUTH AT BEDTIME FOR CHOLESTEROL   glimepiride (AMARYL) 1 MG tablet TAKE ONE TAB WITH LUNCH FOR DM   mirtazapine (REMERON) 15 MG tablet Take 1 tablet (15 mg total) by mouth at bedtime.   risperiDONE (RISPERDAL M-TABS) 2 MG disintegrating tablet Take 1 tablet (2 mg total) by mouth at bedtime.   sertraline (ZOLOFT) 100 MG tablet Take 1 tablet (100 mg total) by mouth daily.   traZODone (DESYREL) 50 MG tablet TAKE 0.5-1 TABLET BY MOUTH  AT BEDTIME AS NEEDED FOR SLEEP   No facility-administered encounter medications on file as of 09/25/2021.    Surgical History: Past Surgical History:  Procedure Laterality Date   ABDOMINAL HYSTERECTOMY  2005   APPENDECTOMY  1980   CARDIAC CATHETERIZATION Left 09/17/2015   Procedure: Left Heart Cath and Coronary Angiography;  Surgeon: Teodoro Spray, MD;  Location: [facility];  Service: Cardiovascular;  Laterality: Left;   CARPAL TUNNEL RELEASE     CATARACT  EXTRACTION W/PHACO Left 09/25/2019   Procedure: CATARACT EXTRACTION PHACO AND INTRAOCULAR LENS PLACEMENT (IOC) LEFT   4.45  00:36.1;  Surgeon: Birder Robson, MD;  Location: [facility];  Service: Ophthalmology;  Laterality: Left;   CATARACT EXTRACTION W/PHACO Right 10/16/2019   Procedure: CATARACT EXTRACTION PHACO AND INTRAOCULAR LENS PLACEMENT (IOC)right  DIABETIC;  Surgeon: Birder Robson, MD;  Location: [place];  Service: Ophthalmology;  Laterality: Right;  5.29 0:33.7   COLONOSCOPY WITH PROPOFOL N/A 11/07/2017   Procedure: COLONOSCOPY WITH PROPOFOL;  Surgeon: Jonathon Bellows, MD;  Location: [facility];  Service: Gastroenterology;  Laterality: N/A;   FOOT SURGERY Left    2 nodules removed   FRACTURE SURGERY Right    wrist   WRIST FRACTURE SURGERY Right     Medical History: Past Medical History:  Diagnosis Date   Allergy    Anxiety    Arthritis    legs   Cancer ([place])    skin cancer on right shoulder.    Depression    Diabetes mellitus without complication ([place])    Diverticulitis    Dyspnea    with exertion   Glaucoma    Hyperlipidemia    Hypertension    controlled on meds   Hypothyroidism    Liver cyst    Osteoporosis    Shoulder pain, left    and arm   Thyroid disease    Wrist fracture 2001   right    Family History: Family History  Problem Relation Age of Onset   Heart disease Mother        h/o rheumatic fever   Heart disease Father    Stroke Father    Diabetes Sister    Diabetes Brother    Diabetes Sister    Mental illness Other    Colon cancer Neg Hx    Breast cancer Neg Hx     Social History   Socioeconomic History   Marital status: Divorced    Spouse name: Not on file   Number of children: 3   Years of education: Not on file   Highest education level: 10th grade  Occupational History   Not on file  Tobacco Use   Smoking status: Former    Packs/day: 0.25    Years: 10.00    Pack years: 2.50    Types: Cigarettes     Quit date: 09/17/1979    Years since quitting: 42.0   Smokeless tobacco: Never  Vaping Use   Vaping Use: Never used  Substance and Sexual Activity   Alcohol use: No   Drug use: No   Sexual activity: Yes    Partners: Male    Birth control/protection: None  Other Topics Concern   Not on file  Social History Narrative   3 kids, local   Lives with daughter as of 2016   Divorced ~2002   Social Determinants of Health   Financial Resource Strain: Not on file  Food Insecurity: Not on file  Transportation Needs: Not on file  Physical Activity: Not on file  Stress: Not on file  Social Connections: Not on file  Intimate Partner Violence: Not on file      Review of Systems  Constitutional:  Negative for chills, fatigue and unexpected weight change.  HENT:  Negative for congestion, rhinorrhea, sneezing and sore throat.   Eyes:  Negative for redness.  Respiratory:  Negative for cough, chest tightness and shortness of breath.   Cardiovascular:  Negative for chest pain and palpitations.  Gastrointestinal:  Negative for abdominal pain, constipation, diarrhea, nausea and vomiting.  Genitourinary:  Negative for dysuria and frequency.  Musculoskeletal:  Negative for arthralgias, back pain, joint swelling and neck pain.  Skin:  Negative for rash.  Neurological: Negative.  Negative for tremors and numbness.  Hematological:  Negative for adenopathy. Does not bruise/bleed easily.  Psychiatric/Behavioral:  Positive for sleep disturbance. Negative for behavioral problems (Depression), dysphoric mood, hallucinations, self-injury and suicidal ideas. The patient is not nervous/anxious.    Vital Signs: BP 129/64   Pulse 95   Temp 98.3 F (36.8 C)   Resp 16   Ht '5\' 3"'$  (1.6 m)   Wt 167 lb 9.6 oz (76 kg)   SpO2 96%   BMI 29.69 kg/m    Physical Exam Vitals reviewed.  Constitutional:      General: She is not in acute distress.    Appearance: Normal appearance. She is not ill-appearing.   HENT:     Head: Normocephalic and atraumatic.  Eyes:     Pupils: Pupils are equal, round, and reactive to light.  Cardiovascular:     Rate and Rhythm: Normal rate and regular rhythm.  Pulmonary:     Effort: Pulmonary effort is normal. No respiratory distress.  Neurological:     Mental Status: She is alert and oriented to person, place, and time.  Psychiatric:        Mood and Affect: Mood normal.        Behavior: Behavior normal.       Assessment/Plan: 1. Type 2 diabetes mellitus without complication, without long-term current use of insulin (HCC) A1c remains stable at 5.9, no significant change, repeat A1c in 3 months.  Continue glimepiride as prescribed, refills ordered. - POCT HgB A1C - glimepiride (AMARYL) 1 MG tablet; TAKE ONE TAB WITH LUNCH FOR DM  Dispense: 90 tablet; Refill: 3  2. Aortic atherosclerosis ([place]) With a prior diagnosis of aortic atherosclerosis, patient is at increased risk of cardiovascular disease and stroke, patient recommended to start taking 81 mg of aspirin for prophylaxis and an over-the-counter omega-3 fish oil or flaxseed oil supplement recommended as well.  Also continue atorvastatin as prescribed, refills ordered. - aspirin EC 81 MG tablet; Take 1 tablet (81 mg total) by mouth daily. Swallow whole. Please get OTC. - Flaxseed, Linseed, (EQL FLAXSEED OIL) 1200 MG CAPS; Take 1 capsule by mouth daily. Please get OTC - atorvastatin (LIPITOR) 10 MG tablet; TAKE 1 TABLET BY MOUTH AT BEDTIME FOR CHOLESTEROL  Dispense: 90 tablet; Refill: 1  3. Mixed hyperlipidemia Continue atorvastatin as prescribed, patient recommended to get over-the-counter flaxseed oil or omega-3 fish oil. - Flaxseed, Linseed, (EQL FLAXSEED OIL) 1200 MG CAPS; Take 1 capsule by mouth daily. Please get OTC - atorvastatin (LIPITOR) 10 MG tablet; TAKE 1 TABLET BY MOUTH AT BEDTIME FOR CHOLESTEROL  Dispense: 90 tablet; Refill: 1  4. Psychophysiological insomnia Patient reports she is sleeping  better with mirtazapine and trazodone, continue as prescribed, refills ordered. - mirtazapine (REMERON) 15 MG tablet; Take  1 tablet (15 mg total) by mouth at bedtime.  Dispense: 30 tablet; Refill: 3 - traZODone (DESYREL) 50 MG tablet; TAKE 0.5-1 TABLET BY MOUTH AT BEDTIME AS NEEDED FOR SLEEP  Dispense: 30 tablet; Refill: 3  5. Major depression with psychotic features ([place]) Depressive symptoms seem to be well controlled, patient reports she is stable, denies depressive symptoms and mood, reports she is sleeping better and denies any delusional thoughts, hallucinations or paranoia. - mirtazapine (REMERON) 15 MG tablet; Take 1 tablet (15 mg total) by mouth at bedtime.  Dispense: 30 tablet; Refill: 3 - risperiDONE (RISPERDAL M-TABS) 2 MG disintegrating tablet; Take 1 tablet (2 mg total) by mouth at bedtime.  Dispense: 28 tablet; Refill: 3 - traZODone (DESYREL) 50 MG tablet; TAKE 0.5-1 TABLET BY MOUTH AT BEDTIME AS NEEDED FOR SLEEP  Dispense: 30 tablet; Refill: 3 - sertraline (ZOLOFT) 100 MG tablet; Take 1 tablet (100 mg total) by mouth daily.  Dispense: 30 tablet; Refill: 5  6. Delusional disorder [facility]) Patient continues to take Risperdal as prescribed, refills ordered.  Patient is currently not voicing any delusions or hallucinations. - risperiDONE (RISPERDAL M-TABS) 2 MG disintegrating tablet; Take 1 tablet (2 mg total) by mouth at bedtime.  Dispense: 28 tablet; Refill: 3   General Counseling: Colleena verbalizes understanding of the findings of todays visit and agrees with plan of treatment. I have discussed any further diagnostic evaluation that may be needed or ordered today. We also reviewed her medications today. she has been encouraged to call the office with any questions or concerns that should arise related to todays visit.    Orders Placed This Encounter  Procedures   POCT HgB A1C    Meds ordered this encounter  Medications   aspirin EC 81 MG tablet    Sig: Take 1 tablet (81 mg  total) by mouth daily. Swallow whole. Please get OTC.   Flaxseed, Linseed, (EQL FLAXSEED OIL) 1200 MG CAPS    Sig: Take 1 capsule by mouth daily. Please get OTC   mirtazapine (REMERON) 15 MG tablet    Sig: Take 1 tablet (15 mg total) by mouth at bedtime.    Dispense:  30 tablet    Refill:  3    FOR FUTRE REFILLS   atorvastatin (LIPITOR) 10 MG tablet    Sig: TAKE 1 TABLET BY MOUTH AT BEDTIME FOR CHOLESTEROL    Dispense:  90 tablet    Refill:  1    PT REQUEST REFILLS PLEASE   glimepiride (AMARYL) 1 MG tablet    Sig: TAKE ONE TAB WITH LUNCH FOR DM    Dispense:  90 tablet    Refill:  3   risperiDONE (RISPERDAL M-TABS) 2 MG disintegrating tablet    Sig: Take 1 tablet (2 mg total) by mouth at bedtime.    Dispense:  28 tablet    Refill:  3   traZODone (DESYREL) 50 MG tablet    Sig: TAKE 0.5-1 TABLET BY MOUTH AT BEDTIME AS NEEDED FOR SLEEP    Dispense:  30 tablet    Refill:  3    FOR FUTURE REFILLS IF ALLOWED   sertraline (ZOLOFT) 100 MG tablet    Sig: Take 1 tablet (100 mg total) by mouth daily.    Dispense:  30 tablet    Refill:  5    Return in about 3 months (around 12/26/2021) for F/U, Recheck A1C, Akia Montalban PCP.   Total time spent:30 Minutes Time spent includes review of chart, medications, test results, and  follow up plan with the patient.   [place] Controlled Substance Database was reviewed by me.  This patient was seen by Jonetta Osgood, FNP-C in collaboration with Dr. Clayborn Bigness as a part of collaborative care agreement.   Adahlia Stembridge R. Valetta Fuller, MSN, FNP-C Internal medicine

## 2021-10-27 ENCOUNTER — Other Ambulatory Visit: Payer: Self-pay | Admitting: Nurse Practitioner

## 2021-10-27 DIAGNOSIS — M10071 Idiopathic gout, right ankle and foot: Secondary | ICD-10-CM

## 2021-12-25 ENCOUNTER — Encounter: Payer: Self-pay | Admitting: Nurse Practitioner

## 2021-12-25 ENCOUNTER — Ambulatory Visit (INDEPENDENT_AMBULATORY_CARE_PROVIDER_SITE_OTHER): Payer: Medicare Other | Admitting: Nurse Practitioner

## 2021-12-25 VITALS — BP 132/82 | HR 74 | Temp 98.0°F | Resp 16 | Ht 63.0 in | Wt 169.3 lb

## 2021-12-25 DIAGNOSIS — I700 Atherosclerosis of aorta: Secondary | ICD-10-CM | POA: Diagnosis not present

## 2021-12-25 DIAGNOSIS — N1831 Chronic kidney disease, stage 3a: Secondary | ICD-10-CM

## 2021-12-25 DIAGNOSIS — Z760 Encounter for issue of repeat prescription: Secondary | ICD-10-CM

## 2021-12-25 DIAGNOSIS — F22 Delusional disorders: Secondary | ICD-10-CM

## 2021-12-25 DIAGNOSIS — F323 Major depressive disorder, single episode, severe with psychotic features: Secondary | ICD-10-CM

## 2021-12-25 DIAGNOSIS — E1122 Type 2 diabetes mellitus with diabetic chronic kidney disease: Secondary | ICD-10-CM | POA: Diagnosis not present

## 2021-12-25 DIAGNOSIS — E119 Type 2 diabetes mellitus without complications: Secondary | ICD-10-CM

## 2021-12-25 DIAGNOSIS — F5104 Psychophysiologic insomnia: Secondary | ICD-10-CM

## 2021-12-25 DIAGNOSIS — I10 Essential (primary) hypertension: Secondary | ICD-10-CM

## 2021-12-25 LAB — POCT GLYCOSYLATED HEMOGLOBIN (HGB A1C): Hemoglobin A1C: 6.3 % — AB (ref 4.0–5.6)

## 2021-12-25 MED ORDER — SERTRALINE HCL 100 MG PO TABS: 100.0000 mg | ORAL_TABLET | Freq: Every day | ORAL | 3 refills | Status: DC

## 2021-12-25 MED ORDER — MIRTAZAPINE 15 MG PO TABS: 15.0000 mg | ORAL_TABLET | Freq: Every day | ORAL | 3 refills | Status: DC

## 2021-12-25 MED ORDER — RISPERIDONE 2 MG PO TBDP: 2.0000 mg | ORAL_TABLET | Freq: Every day | ORAL | 3 refills | Status: DC

## 2021-12-25 MED ORDER — TRAZODONE HCL 50 MG PO TABS: ORAL_TABLET | ORAL | 3 refills | Status: DC

## 2021-12-25 MED ORDER — METFORMIN HCL 500 MG PO TABS: 250.0000 mg | ORAL_TABLET | Freq: Two times a day (BID) | ORAL | 0 refills | Status: DC

## 2021-12-25 NOTE — Progress Notes (Signed)
[facility] [place], [place] [address]  Internal MEDICINE  Office Visit Note  Patient Name: Stacey Boyd  [id number]  [id number]  Date of Service: 12/25/2021  Chief Complaint  Patient presents with   Follow-up   Depression   Diabetes   Hypertension   Hyperlipidemia   Quality Metric Gaps    Shingles Vaccine    HPI Stacey Boyd presents for a follow-up visit for diabetes, hypertension, med refills. Diabetes --A1c increased to 6.3.  Has had no significant changes in diet or lifestyle.  Previously patient has stopped taking her metformin Hypertension --controlled with current medications Aortic atherosclerosis --has no carotid ultrasound on record, need baseline    Current Medication: Outpatient Encounter Medications as of 12/25/2021  Medication Sig   acetaminophen (TYLENOL) 650 MG CR tablet Take 650-1,300 mg by mouth every 8 (eight) hours as needed for pain.   aspirin EC 81 MG tablet Take 1 tablet (81 mg total) by mouth daily. Swallow whole. Please get OTC.   atorvastatin (LIPITOR) 10 MG tablet TAKE 1 TABLET BY MOUTH AT BEDTIME FOR CHOLESTEROL   Cholecalciferol (VITAMIN D3) 5000 units TABS Take 5,000 Units by mouth daily.    colchicine 0.6 MG tablet ON FIRST DAY OF GOUT FLARE TAKE 2 TABLETS, THEN TAKE 1 ADIDTIONAL TABLET AN HOUR LATER. THEN TAKE 1 TABLET DAILY UNTIL FLARE RESOLVES.   COVID-19 mRNA bivalent vaccine, Pfizer, (PFIZER COVID-19 VAC BIVALENT) injection Inject into the muscle.   dorzolamide-timolol (COSOPT) 22.3-6.8 MG/ML ophthalmic solution Place 1 drop into both eyes 2 (two) times daily.   Flaxseed, Linseed, (EQL FLAXSEED OIL) 1200 MG CAPS Take 1 capsule by mouth daily. Please get OTC   glimepiride (AMARYL) 1 MG tablet TAKE ONE TAB WITH LUNCH FOR DM   latanoprost (XALATAN) 0.005 % ophthalmic solution Place 1 drop into both eyes at bedtime.   metFORMIN (GLUCOPHAGE) 500 MG tablet Take 0.5 tablets (250 mg total) by mouth 2 (two) times daily with a  meal.   [DISCONTINUED] mirtazapine (REMERON) 15 MG tablet Take 1 tablet (15 mg total) by mouth at bedtime.   [DISCONTINUED] risperiDONE (RISPERDAL M-TABS) 2 MG disintegrating tablet Take 1 tablet (2 mg total) by mouth at bedtime.   [DISCONTINUED] sertraline (ZOLOFT) 100 MG tablet Take 1 tablet (100 mg total) by mouth daily.   [DISCONTINUED] traZODone (DESYREL) 50 MG tablet TAKE 0.5-1 TABLET BY MOUTH AT BEDTIME AS NEEDED FOR SLEEP   mirtazapine (REMERON) 15 MG tablet Take 1 tablet (15 mg total) by mouth at bedtime.   risperiDONE (RISPERDAL M-TABS) 2 MG disintegrating tablet Take 1 tablet (2 mg total) by mouth at bedtime.   sertraline (ZOLOFT) 100 MG tablet Take 1 tablet (100 mg total) by mouth daily.   traZODone (DESYREL) 50 MG tablet TAKE 0.5-1 TABLET BY MOUTH AT BEDTIME AS NEEDED FOR SLEEP   [DISCONTINUED] traZODone (DESYREL) 50 MG tablet TAKE 0.5-1 TABLET BY MOUTH AT BEDTIME AS NEEDED FOR SLEEP   No facility-administered encounter medications on file as of 12/25/2021.    Surgical History: Past Surgical History:  Procedure Laterality Date   ABDOMINAL HYSTERECTOMY  2005   APPENDECTOMY  1980   CARDIAC CATHETERIZATION Left 09/17/2015   Procedure: Left Heart Cath and Coronary Angiography;  Surgeon: Teodoro Spray, MD;  Location: [place] [facility];  Service: Cardiovascular;  Laterality: Left;   CARPAL TUNNEL RELEASE     CATARACT EXTRACTION W/PHACO Left 09/25/2019   Procedure: CATARACT EXTRACTION PHACO AND INTRAOCULAR LENS PLACEMENT (IOC) LEFT   4.45  00:36.1;  Surgeon: Birder Robson, MD;  Location: [place];  Service: Ophthalmology;  Laterality: Left;   CATARACT EXTRACTION W/PHACO Right 10/16/2019   Procedure: CATARACT EXTRACTION PHACO AND INTRAOCULAR LENS PLACEMENT (IOC)right  DIABETIC;  Surgeon: Birder Robson, MD;  Location: [place];  Service: Ophthalmology;  Laterality: Right;  5.29 0:33.7   COLONOSCOPY WITH PROPOFOL N/A 11/07/2017   Procedure: COLONOSCOPY WITH  PROPOFOL;  Surgeon: Jonathon Bellows, MD;  Location: [facility] ENDOS[facility];  Service: Gastroenterology;  Laterality: N/A;   FOOT SURGERY Left    2 nodules removed   FRACTURE SURGERY Right    wrist   WRIST FRACTURE SURGERY Right     Medical History: Past Medical History:  Diagnosis Date   Allergy    Anxiety    Arthritis    legs   Cancer ([place])    skin cancer on right shoulder.    Depression    Diabetes mellitus without complication ([place])    Diverticulitis    Dyspnea    with exertion   Glaucoma    Hyperlipidemia    Hypertension    controlled on meds   Hypothyroidism    Liver cyst    Osteoporosis    Shoulder pain, left    and arm   Thyroid disease    Wrist fracture 2001   right    Family History: Family History  Problem Relation Age of Onset   Heart disease Mother        h/o rheumatic fever   Heart disease Father    Stroke Father    Diabetes Sister    Diabetes Brother    Diabetes Sister    Mental illness Other    Colon cancer Neg Hx    Breast cancer Neg Hx     Social History   Socioeconomic History   Marital status: Divorced    Spouse name: Not on file   Number of children: 3   Years of education: Not on file   Highest education level: 10th grade  Occupational History   Not on file  Tobacco Use   Smoking status: Former    Packs/day: 0.25    Years: 10.00    Total pack years: 2.50    Types: Cigarettes    Quit date: 09/17/1979    Years since quitting: 42.3   Smokeless tobacco: Never  Vaping Use   Vaping Use: Never used  Substance and Sexual Activity   Alcohol use: No   Drug use: No   Sexual activity: Yes    Partners: Male    Birth control/protection: None  Other Topics Concern   Not on file  Social History Narrative   3 kids, local   Lives with daughter as of 2016   Divorced ~2002   Social Determinants of Health   Financial Resource Strain: Low Risk  (12/29/2017)   Overall Financial Resource Strain (CARDIA)    Difficulty of Paying Living Expenses:  Not hard at all  Food Insecurity: No Food Insecurity (12/29/2017)   Hunger Vital Sign    Worried About Running Out of Food in the Last Year: Never true    Ran Out of Food in the Last Year: Never true  Transportation Needs: No Transportation Needs (12/29/2017)   PRAPARE - Hydrologist (Medical): No    Lack of Transportation (Non-Medical): No  Physical Activity: Inactive (12/29/2017)   Exercise Vital Sign    Days of Exercise per Week: 0 days    Minutes of Exercise  per Session: 0 min  Stress: No Stress Concern Present (12/29/2017)   [place]    Feeling of Stress : Not at all  Social Connections: Unknown (12/29/2017)   Social Connection and Isolation Panel [NHANES]    Frequency of Communication with Friends and Family: Not on file    Frequency of Social Gatherings with Friends and Family: Not on file    Attends Religious Services: 1 to 4 times per year    Active Member of [employer] or Organizations: No    Attends Archivist Meetings: Never    Marital Status: Divorced  Human resources officer Violence: Not At Risk (12/29/2017)   Humiliation, Afraid, Rape, and Kick questionnaire    Fear of Current or Ex-Partner: No    Emotionally Abused: No    Physically Abused: No    Sexually Abused: No      Review of Systems  Constitutional:  Negative for chills, fatigue and unexpected weight change.  HENT:  Negative for congestion, rhinorrhea, sneezing and sore throat.   Eyes:  Negative for redness.  Respiratory:  Negative for cough, chest tightness and shortness of breath.   Cardiovascular:  Negative for chest pain and palpitations.  Gastrointestinal:  Negative for abdominal pain, constipation, diarrhea, nausea and vomiting.  Genitourinary:  Negative for dysuria and frequency.  Musculoskeletal:  Negative for arthralgias, back pain, joint swelling and neck pain.  Skin:  Negative for rash.  Neurological:  Negative.  Negative for tremors and numbness.  Hematological:  Negative for adenopathy. Does not bruise/bleed easily.  Psychiatric/Behavioral:  Positive for sleep disturbance. Negative for behavioral problems (Depression), dysphoric mood, hallucinations, self-injury and suicidal ideas. The patient is not nervous/anxious.     Vital Signs: BP 132/82   Pulse 74   Temp 98 F (36.7 C)   Resp 16   Ht '5\' 3"'$  (1.6 m)   Wt 169 lb 4.8 oz (76.8 kg)   SpO2 96%   BMI 29.99 kg/m    Physical Exam Vitals reviewed.  Constitutional:      General: She is not in acute distress.    Appearance: Normal appearance. She is not ill-appearing.  HENT:     Head: Normocephalic and atraumatic.  Eyes:     Pupils: Pupils are equal, round, and reactive to light.  Cardiovascular:     Rate and Rhythm: Normal rate and regular rhythm.  Pulmonary:     Effort: Pulmonary effort is normal. No respiratory distress.  Neurological:     Mental Status: She is alert and oriented to person, place, and time.  Psychiatric:        Mood and Affect: Mood normal.        Behavior: Behavior normal.        Assessment/Plan: 1. Type 2 diabetes mellitus with stage 3a chronic kidney disease, without long-term current use of insulin (HCC) A1c increased to 6.3, metformin 250 mg twice daily added back to medication list, patient agreeable to start taking this medication again, follow-up in 3 months for repeat A1c - POCT HgB A1C - metFORMIN (GLUCOPHAGE) 500 MG tablet; Take 0.5 tablets (250 mg total) by mouth 2 (two) times daily with a meal.  Dispense: 90 tablet; Refill: 0  2. Essential hypertension, benign Stable with current medications, continue as prescribed  3. Aortic atherosclerosis ([place]) Carotid ultrasound ordered to establish baseline of stenosis and plaque formation.  We will follow-up with patient to review ultrasound results - US Carotid Bilateral; Future  4. Medication  refill - mirtazapine (REMERON) 15 MG tablet;  Take 1 tablet (15 mg total) by mouth at bedtime.  Dispense: 90 tablet; Refill: 3 - risperiDONE (RISPERDAL M-TABS) 2 MG disintegrating tablet; Take 1 tablet (2 mg total) by mouth at bedtime.  Dispense: 90 tablet; Refill: 3 - sertraline (ZOLOFT) 100 MG tablet; Take 1 tablet (100 mg total) by mouth daily.  Dispense: 90 tablet; Refill: 3 - traZODone (DESYREL) 50 MG tablet; TAKE 0.5-1 TABLET BY MOUTH AT BEDTIME AS NEEDED FOR SLEEP  Dispense: 90 tablet; Refill: 3    General Counseling: Stacey Boyd verbalizes understanding of the findings of todays visit and agrees with plan of treatment. I have discussed any further diagnostic evaluation that may be needed or ordered today. We also reviewed her medications today. she has been encouraged to call the office with any questions or concerns that should arise related to todays visit.    Orders Placed This Encounter  Procedures   POCT HgB A1C    Meds ordered this encounter  Medications   DISCONTD: traZODone (DESYREL) 50 MG tablet    Sig: TAKE 0.5-1 TABLET BY MOUTH AT BEDTIME AS NEEDED FOR SLEEP    Dispense:  30 tablet    Refill:  3    FOR FUTURE REFILLS IF ALLOWED   metFORMIN (GLUCOPHAGE) 500 MG tablet    Sig: Take 0.5 tablets (250 mg total) by mouth 2 (two) times daily with a meal.    Dispense:  90 tablet    Refill:  0    Please fill today for 90 days, no refills until next office visit to recheck a1c in 3 months   mirtazapine (REMERON) 15 MG tablet    Sig: Take 1 tablet (15 mg total) by mouth at bedtime.    Dispense:  90 tablet    Refill:  3    FOR FUTRE REFILLS   risperiDONE (RISPERDAL M-TABS) 2 MG disintegrating tablet    Sig: Take 1 tablet (2 mg total) by mouth at bedtime.    Dispense:  90 tablet    Refill:  3    Please fill as a 90 day supply for future refills   sertraline (ZOLOFT) 100 MG tablet    Sig: Take 1 tablet (100 mg total) by mouth daily.    Dispense:  90 tablet    Refill:  3    For future refills, please fill 90 day supply    traZODone (DESYREL) 50 MG tablet    Sig: TAKE 0.5-1 TABLET BY MOUTH AT BEDTIME AS NEEDED FOR SLEEP    Dispense:  90 tablet    Refill:  3    FOR FUTURE REFILLS please fill for 90 day supply    Return in about 3 months (around 03/27/2022) for F/U, Recheck A1C, Gio Janoski PCP.   Total time spent:30 Minutes Time spent includes review of chart, medications, test results, and follow up plan with the patient.   [place] Controlled Substance Database was reviewed by me.  This patient was seen by Jonetta Osgood, FNP-C in collaboration with Dr. Clayborn Bigness as a part of collaborative care agreement.   Merric Yost R. Valetta Fuller, MSN, FNP-C Internal medicine

## 2022-01-11 DIAGNOSIS — E119 Type 2 diabetes mellitus without complications: Secondary | ICD-10-CM | POA: Diagnosis not present

## 2022-01-11 DIAGNOSIS — H401132 Primary open-angle glaucoma, bilateral, moderate stage: Secondary | ICD-10-CM | POA: Diagnosis not present

## 2022-01-11 DIAGNOSIS — H3589 Other specified retinal disorders: Secondary | ICD-10-CM | POA: Diagnosis not present

## 2022-02-05 DIAGNOSIS — H401132 Primary open-angle glaucoma, bilateral, moderate stage: Secondary | ICD-10-CM | POA: Diagnosis not present

## 2022-02-14 ENCOUNTER — Encounter: Payer: Self-pay | Admitting: Nurse Practitioner

## 2022-02-19 DIAGNOSIS — H401132 Primary open-angle glaucoma, bilateral, moderate stage: Secondary | ICD-10-CM | POA: Diagnosis not present

## 2022-03-06 ENCOUNTER — Other Ambulatory Visit: Payer: Self-pay | Admitting: Nurse Practitioner

## 2022-03-06 DIAGNOSIS — E782 Mixed hyperlipidemia: Secondary | ICD-10-CM

## 2022-03-06 DIAGNOSIS — I700 Atherosclerosis of aorta: Secondary | ICD-10-CM

## 2022-03-30 ENCOUNTER — Other Ambulatory Visit: Payer: Self-pay | Admitting: Nurse Practitioner

## 2022-03-30 DIAGNOSIS — N1831 Chronic kidney disease, stage 3a: Secondary | ICD-10-CM

## 2022-04-02 ENCOUNTER — Ambulatory Visit (INDEPENDENT_AMBULATORY_CARE_PROVIDER_SITE_OTHER): Payer: Medicare Other | Admitting: Nurse Practitioner

## 2022-04-02 ENCOUNTER — Encounter: Payer: Self-pay | Admitting: Nurse Practitioner

## 2022-04-02 VITALS — BP 114/63 | HR 73 | Temp 96.2°F | Resp 16 | Ht 63.0 in | Wt 166.4 lb

## 2022-04-02 DIAGNOSIS — R531 Weakness: Secondary | ICD-10-CM | POA: Diagnosis not present

## 2022-04-02 DIAGNOSIS — N1831 Chronic kidney disease, stage 3a: Secondary | ICD-10-CM

## 2022-04-02 DIAGNOSIS — H524 Presbyopia: Secondary | ICD-10-CM

## 2022-04-02 DIAGNOSIS — E1122 Type 2 diabetes mellitus with diabetic chronic kidney disease: Secondary | ICD-10-CM | POA: Diagnosis not present

## 2022-04-02 DIAGNOSIS — R269 Unspecified abnormalities of gait and mobility: Secondary | ICD-10-CM | POA: Diagnosis not present

## 2022-04-02 DIAGNOSIS — Z760 Encounter for issue of repeat prescription: Secondary | ICD-10-CM

## 2022-04-02 DIAGNOSIS — M25642 Stiffness of left hand, not elsewhere classified: Secondary | ICD-10-CM

## 2022-04-02 DIAGNOSIS — I10 Essential (primary) hypertension: Secondary | ICD-10-CM

## 2022-04-02 LAB — POCT GLYCOSYLATED HEMOGLOBIN (HGB A1C): Hemoglobin A1C: 6.6 % — AB (ref 4.0–5.6)

## 2022-04-02 NOTE — Progress Notes (Unsigned)
[facility] [place], [place] [address]  Internal MEDICINE  Office Visit Note  Patient Name: Sumiko Ceasar  [id number]  [id number]  Date of Service: 04/02/2022  Chief Complaint  Patient presents with   Follow-up   Depression   Diabetes   Hyperlipidemia   Hypertension    HPI Kerissa presents for a follow-up visit for  Lost 3 lbs  A1c increased to 6.6 from 6.3      Current Medication: Outpatient Encounter Medications as of 04/02/2022  Medication Sig   acetaminophen (TYLENOL) 650 MG CR tablet Take 650-1,300 mg by mouth every 8 (eight) hours as needed for pain.   aspirin EC 81 MG tablet Take 1 tablet (81 mg total) by mouth daily. Swallow whole. Please get OTC.   atorvastatin (LIPITOR) 10 MG tablet TAKE 1 TABLET BY MOUTH AT BEDTIME FOR CHOLESTEROL   Cholecalciferol (VITAMIN D3) 5000 units TABS Take 5,000 Units by mouth daily.    colchicine 0.6 MG tablet ON FIRST DAY OF GOUT FLARE TAKE 2 TABLETS, THEN TAKE 1 ADIDTIONAL TABLET AN HOUR LATER. THEN TAKE 1 TABLET DAILY UNTIL FLARE RESOLVES.   COVID-19 mRNA bivalent vaccine, Pfizer, (PFIZER COVID-19 VAC BIVALENT) injection Inject into the muscle.   dorzolamide-timolol (COSOPT) 22.3-6.8 MG/ML ophthalmic solution Place 1 drop into both eyes 2 (two) times daily.   Flaxseed, Linseed, (EQL FLAXSEED OIL) 1200 MG CAPS Take 1 capsule by mouth daily. Please get OTC   glimepiride (AMARYL) 1 MG tablet TAKE ONE TAB WITH LUNCH FOR DM   latanoprost (XALATAN) 0.005 % ophthalmic solution Place 1 drop into both eyes at bedtime.   metFORMIN (GLUCOPHAGE) 500 MG tablet TAKE 1/2 TABLET BY MOUTH TWICE DAILY WITH A MEAL   mirtazapine (REMERON) 15 MG tablet Take 1 tablet (15 mg total) by mouth at bedtime.   risperiDONE (RISPERDAL M-TABS) 2 MG disintegrating tablet Take 1 tablet (2 mg total) by mouth at bedtime.   sertraline (ZOLOFT) 100 MG tablet Take 1 tablet (100 mg total) by mouth daily.   traZODone (DESYREL) 50 MG tablet TAKE  0.5-1 TABLET BY MOUTH AT BEDTIME AS NEEDED FOR SLEEP   No facility-administered encounter medications on file as of 04/02/2022.    Surgical History: Past Surgical History:  Procedure Laterality Date   ABDOMINAL HYSTERECTOMY  2005   APPENDECTOMY  1980   CARDIAC CATHETERIZATION Left 09/17/2015   Procedure: Left Heart Cath and Coronary Angiography;  Surgeon: Teodoro Spray, MD;  Location: [place] [facility];  Service: Cardiovascular;  Laterality: Left;   CARPAL TUNNEL RELEASE     CATARACT EXTRACTION W/PHACO Left 09/25/2019   Procedure: CATARACT EXTRACTION PHACO AND INTRAOCULAR LENS PLACEMENT (IOC) LEFT   4.45  00:36.1;  Surgeon: Birder Robson, MD;  Location: [facility];  Service: Ophthalmology;  Laterality: Left;   CATARACT EXTRACTION W/PHACO Right 10/16/2019   Procedure: CATARACT EXTRACTION PHACO AND INTRAOCULAR LENS PLACEMENT (IOC)right  DIABETIC;  Surgeon: Birder Robson, MD;  Location: [place];  Service: Ophthalmology;  Laterality: Right;  5.29 0:33.7   COLONOSCOPY WITH PROPOFOL N/A 11/07/2017   Procedure: COLONOSCOPY WITH PROPOFOL;  Surgeon: Jonathon Bellows, MD;  Location: [facility];  Service: Gastroenterology;  Laterality: N/A;   FOOT SURGERY Left    2 nodules removed   FRACTURE SURGERY Right    wrist   WRIST FRACTURE SURGERY Right     Medical History: Past Medical History:  Diagnosis Date   Allergy    Anxiety    Arthritis  legs   Cancer ([place])    skin cancer on right shoulder.    Depression    Diabetes mellitus without complication ([place])    Diverticulitis    Dyspnea    with exertion   Glaucoma    Hyperlipidemia    Hypertension    controlled on meds   Hypothyroidism    Liver cyst    Osteoporosis    Shoulder pain, left    and arm   Thyroid disease    Wrist fracture 2001   right    Family History: Family History  Problem Relation Age of Onset   Heart disease Mother        h/o rheumatic fever   Heart disease Father    Stroke  Father    Diabetes Sister    Diabetes Brother    Diabetes Sister    Mental illness Other    Colon cancer Neg Hx    Breast cancer Neg Hx     Social History   Socioeconomic History   Marital status: Divorced    Spouse name: Not on file   Number of children: 3   Years of education: Not on file   Highest education level: 10th grade  Occupational History   Not on file  Tobacco Use   Smoking status: Former    Packs/day: 0.25    Years: 10.00    Total pack years: 2.50    Types: Cigarettes    Quit date: 09/17/1979    Years since quitting: 42.5   Smokeless tobacco: Never  Vaping Use   Vaping Use: Never used  Substance and Sexual Activity   Alcohol use: No   Drug use: No   Sexual activity: Yes    Partners: Male    Birth control/protection: None  Other Topics Concern   Not on file  Social History Narrative   3 kids, local   Lives with daughter as of 2016   Divorced ~2002   Social Determinants of Health   Financial Resource Strain: Low Risk  (12/29/2017)   Overall Financial Resource Strain (CARDIA)    Difficulty of Paying Living Expenses: Not hard at all  Food Insecurity: No Food Insecurity (12/29/2017)   Hunger Vital Sign    Worried About Running Out of Food in the Last Year: Never true    Ran Out of Food in the Last Year: Never true  Transportation Needs: No Transportation Needs (12/29/2017)   PRAPARE - Hydrologist (Medical): No    Lack of Transportation (Non-Medical): No  Physical Activity: Inactive (12/29/2017)   Exercise Vital Sign    Days of Exercise per Week: 0 days    Minutes of Exercise per Session: 0 min  Stress: No Stress Concern Present (12/29/2017)   [place]    Feeling of Stress : Not at all  Social Connections: Unknown (12/29/2017)   Social Connection and Isolation Panel [NHANES]    Frequency of Communication with Friends and Family: Not on file    Frequency of  Social Gatherings with Friends and Family: Not on file    Attends Religious Services: 1 to 4 times per year    Active Member of [employer] or Organizations: No    Attends Archivist Meetings: Never    Marital Status: Divorced  Human resources officer Violence: Not At Risk (12/29/2017)   Humiliation, Afraid, Rape, and Kick questionnaire    Fear of Current or Ex-Partner: No  Emotionally Abused: No    Physically Abused: No    Sexually Abused: No      Review of Systems  Vital Signs: BP 114/63   Pulse 73   Temp (!) 96.2 F (35.7 C)   Resp 16   Ht '5\' 3"'$  (1.6 m)   Wt 166 lb 6.4 oz (75.5 kg)   SpO2 96%   BMI 29.48 kg/m    Physical Exam     Assessment/Plan:   General Counseling: Astou verbalizes understanding of the findings of todays visit and agrees with plan of treatment. I have discussed any further diagnostic evaluation that may be needed or ordered today. We also reviewed her medications today. she has been encouraged to call the office with any questions or concerns that should arise related to todays visit.    Orders Placed This Encounter  Procedures   For home use only DME 4 wheeled rolling walker with seat ([id number])   Ambulatory referral to Orthopedic Surgery   POCT glycosylated hemoglobin (Hb A1C)    No orders of the defined types were placed in this encounter.   Return in about 3 months (around 07/06/2022) for F/U, Recheck A1C, Abigayle Wilinski PCP.   Total time spent:*** Minutes Time spent includes review of chart, medications, test results, and follow up plan with the patient.   [place] Controlled Substance Database was reviewed by me.  This patient was seen by Jonetta Osgood, FNP-C in collaboration with Dr. Clayborn Bigness as a part of collaborative care agreement.   Wilson Dusenbery R. Valetta Fuller, MSN, FNP-C Internal medicine

## 2022-04-03 ENCOUNTER — Encounter: Payer: Self-pay | Admitting: Nurse Practitioner

## 2022-04-05 ENCOUNTER — Telehealth: Payer: Self-pay

## 2022-04-05 NOTE — Telephone Encounter (Signed)
Faxed [facility] for walker  

## 2022-04-06 ENCOUNTER — Telehealth: Payer: Self-pay | Admitting: Nurse Practitioner

## 2022-04-06 NOTE — Telephone Encounter (Signed)
Orthopedic Surgery referral sent via [facility] to [facility]. -Toni 

## 2022-04-07 ENCOUNTER — Telehealth: Payer: Self-pay | Admitting: Nurse Practitioner

## 2022-04-07 DIAGNOSIS — R531 Weakness: Secondary | ICD-10-CM | POA: Diagnosis not present

## 2022-04-07 DIAGNOSIS — H524 Presbyopia: Secondary | ICD-10-CM | POA: Diagnosis not present

## 2022-04-07 DIAGNOSIS — R269 Unspecified abnormalities of gait and mobility: Secondary | ICD-10-CM | POA: Diagnosis not present

## 2022-04-07 NOTE — Telephone Encounter (Signed)
Orthopedic appointment 05/04/22 with  [facility]-Toni

## 2022-04-10 ENCOUNTER — Other Ambulatory Visit: Payer: Self-pay | Admitting: Nurse Practitioner

## 2022-04-10 DIAGNOSIS — E1122 Type 2 diabetes mellitus with diabetic chronic kidney disease: Secondary | ICD-10-CM

## 2022-05-04 DIAGNOSIS — M7520 Bicipital tendinitis, unspecified shoulder: Secondary | ICD-10-CM | POA: Diagnosis not present

## 2022-05-04 DIAGNOSIS — M064 Inflammatory polyarthropathy: Secondary | ICD-10-CM | POA: Diagnosis not present

## 2022-05-15 IMAGING — MR MR ABDOMEN WO/W CM
17 series · 48 of 48 positions shown · IV contrast (gadavist)
Comparison: CT on 09/09/2017

CLINICAL DATA: Right upper quadrant pain for 3 months. Nausea and
vomiting. Liver lesion seen on outside ultrasound.

EXAM:
MRI ABDOMEN WITHOUT AND WITH CONTRAST
TECHNIQUE: Multiplanar multisequence MR imaging of the abdomen was performed
both before and after the administration of intravenous contrast.
CONTRAST:  7.5mL GADAVIST GADOBUTROL 1 MMOL/ML IV SOLN

[Series 3: T2 · coronal · 6.0mm · 1.19mm/px · 2 of 30 slices shown (1 of 2)]
[im 1/30]
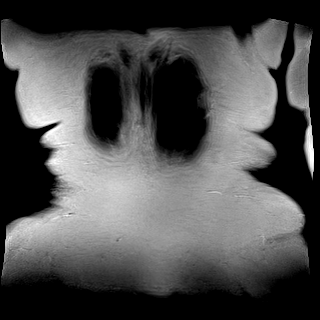
[im 30/30]
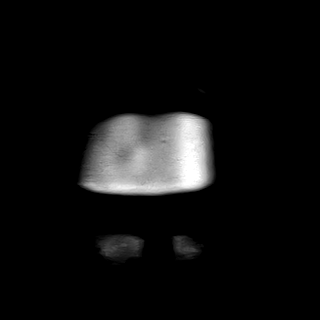

[Series 4: T2 · axial · 6.0mm · 1.19mm/px · z∈[-121,+102]mm · 2 of 32 slices shown (2 of 2)]
[im 1/32]
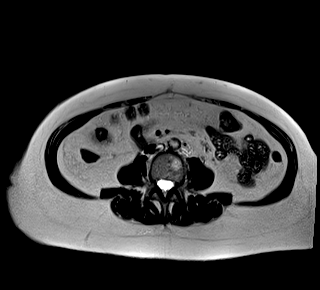
[im 32/32]
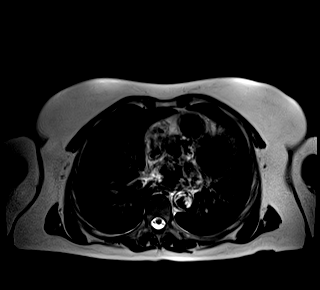

[Series 6: T2 fat-sat · axial · 6.0mm · 1.19mm/px · z∈[-128,+109]mm · 2 of 34 slices shown]
[im 1/34]
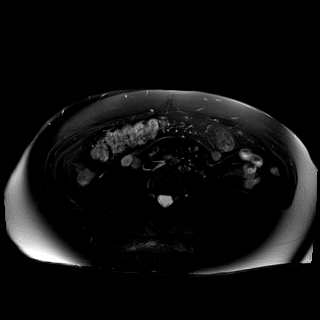
[im 34/34]
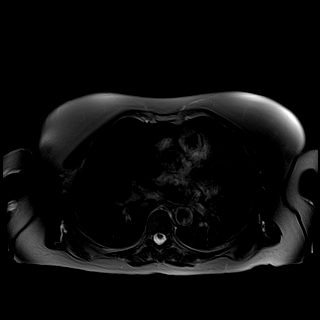

[Series 7: ax dwi_tracew · axial · 6.0mm · 1.42mm/px · z∈[-128,+109]mm · 5 of 102 slices shown]
[im 1/102]
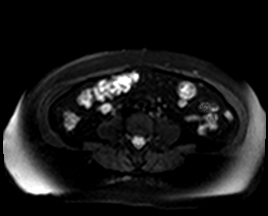
[im 26/102]
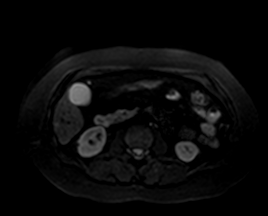
[im 51/102]
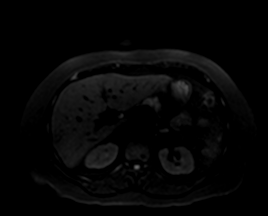
[im 76/102]
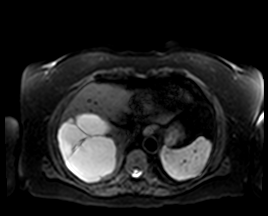
[im 102/102]
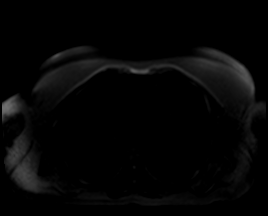

[Series 8: ax dwi_adc · axial · 6.0mm · 1.42mm/px · z∈[-128,+109]mm · 2 of 34 slices shown]
[im 1/34]
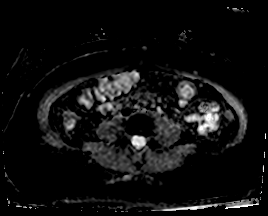
[im 34/34]
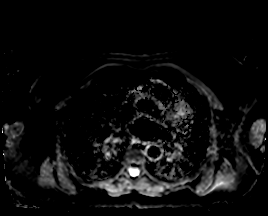

[Series 9: T1 · axial · 6.0mm · 0.74mm/px · z∈[-121,+102]mm · 4 of 64 slices shown]
[im 1/64]
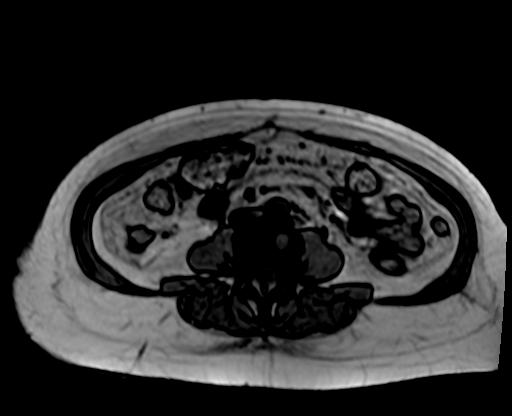
[im 22/64]
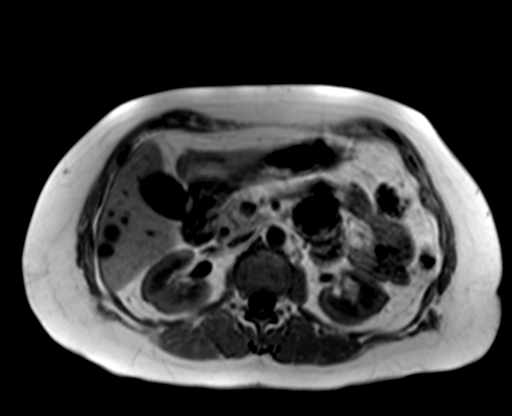
[im 43/64]
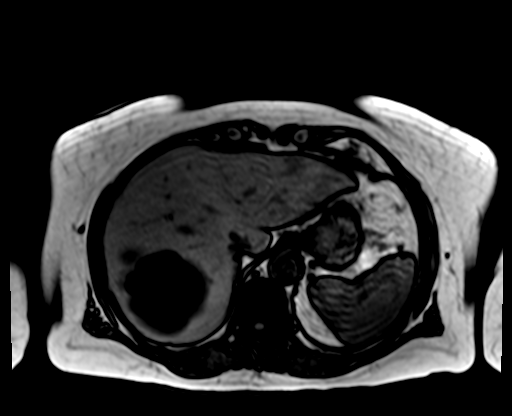
[im 64/64]
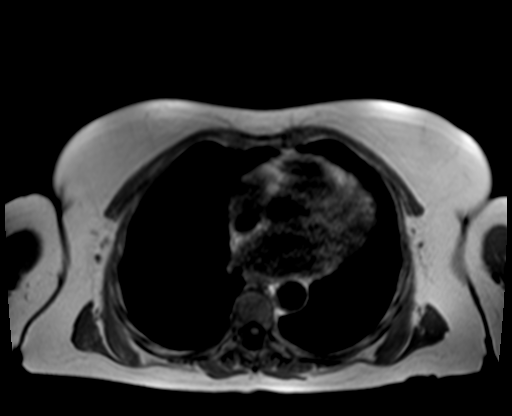

[Series 10: bSSFP · axial · 6.0mm · 0.74mm/px · 1 of 32 slices shown]
[im 1/32]
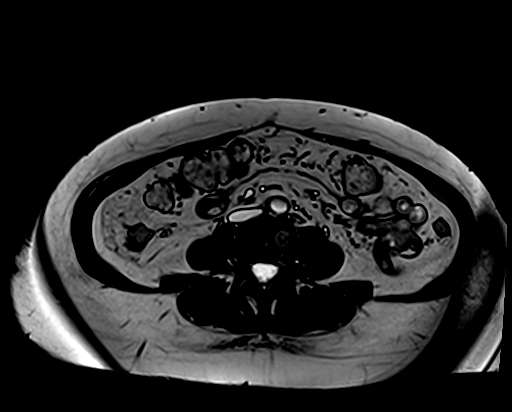

[Series 11: T1 dynamic fat-sat · axial · non-contrast · 3.0mm · 1.19mm/px · z∈[-128,+109]mm · 3 of 80 slices shown (1 of 5)]
[im 1/80]
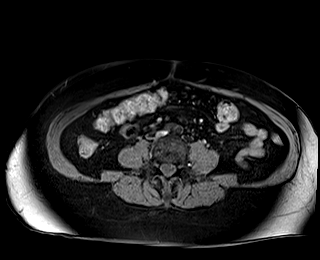
[im 40/80]
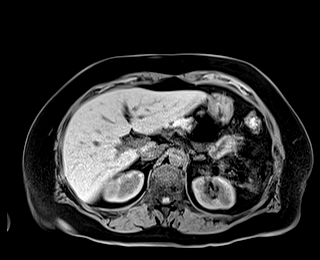
[im 80/80]
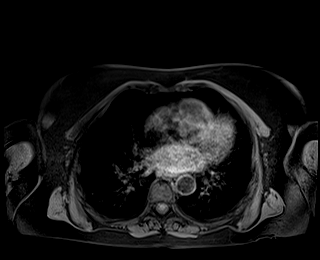

[Series 12: T1 dynamic fat-sat post-contrast · axial · 3.0mm · 1.19mm/px · z∈[-128,+109]mm · 3 of 80 slices shown (1 of 4)]
[im 1/80]
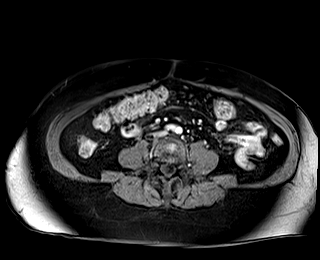
[im 40/80]
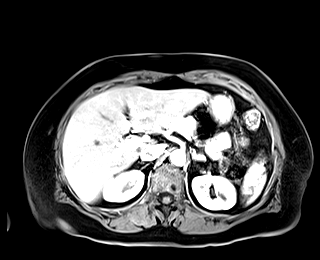
[im 80/80]
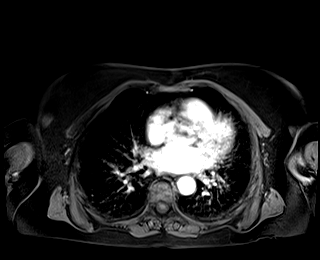

[Series 13: T1 dynamic fat-sat · axial · 3.0mm · 1.19mm/px · z∈[-128,+109]mm · 3 of 80 slices shown (2 of 5)]
[im 1/80]
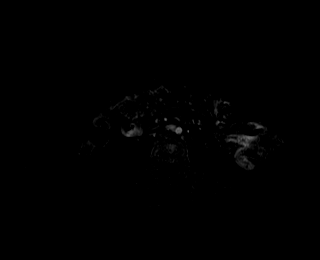
[im 40/80]
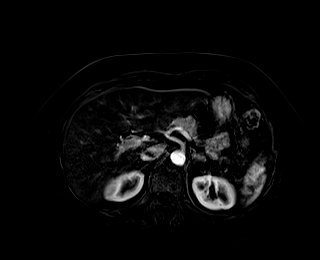
[im 80/80]
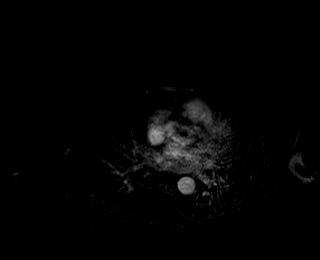

[Series 14: T1 dynamic fat-sat post-contrast · axial · 3.0mm · 1.19mm/px · z∈[-128,+109]mm · 3 of 80 slices shown (2 of 4)]
[im 1/80]
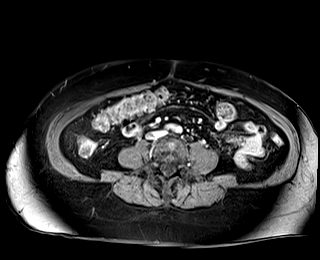
[im 40/80]
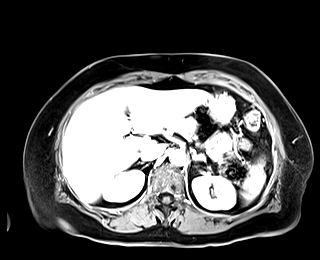
[im 80/80]
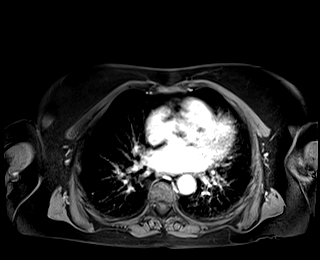

[Series 15: T1 dynamic fat-sat · axial · 3.0mm · 1.19mm/px · z∈[-128,+109]mm · 3 of 80 slices shown (3 of 5)]
[im 1/80]
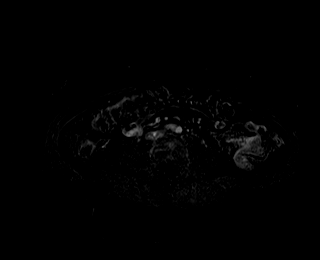
[im 40/80]
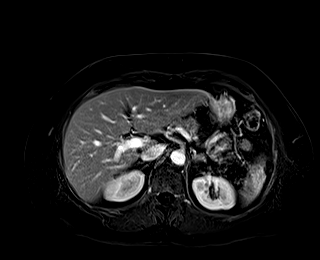
[im 80/80]
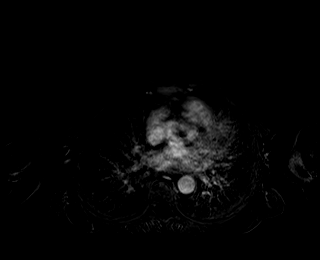

[Series 16: T1 dynamic fat-sat post-contrast · axial · 3.0mm · 1.19mm/px · z∈[-128,+109]mm · 3 of 80 slices shown (3 of 4)]
[im 1/80]
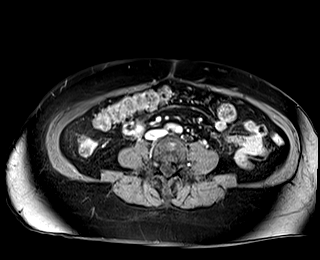
[im 40/80]
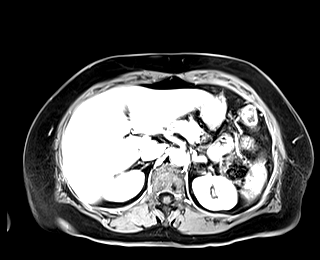
[im 80/80]
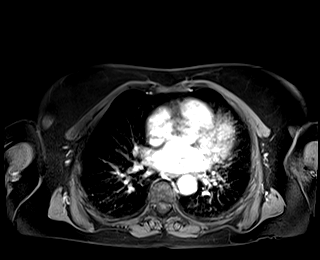

[Series 17: T1 dynamic fat-sat · axial · 3.0mm · 1.19mm/px · z∈[-128,+109]mm · 3 of 80 slices shown (4 of 5)]
[im 1/80]
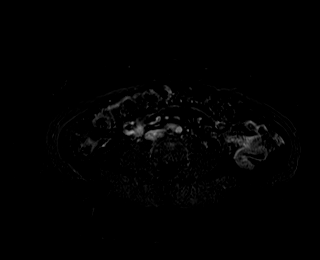
[im 40/80]
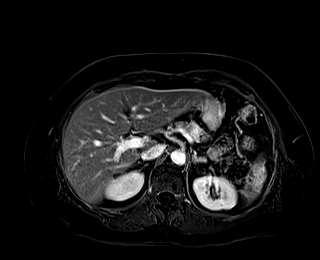
[im 80/80]
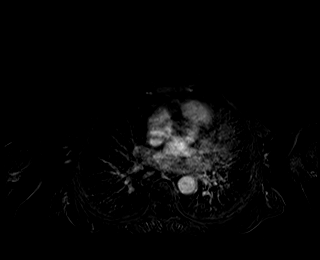

[Series 18: T1 dynamic post-contrast · coronal · 3.0mm · 1.31mm/px · 3 of 72 slices shown]
[im 1/72]
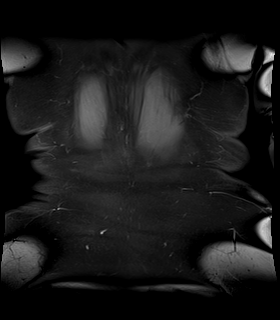
[im 36/72]
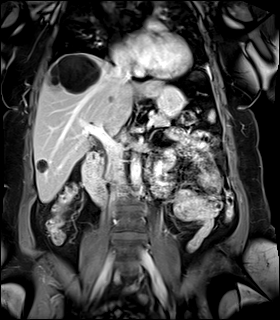
[im 72/72]
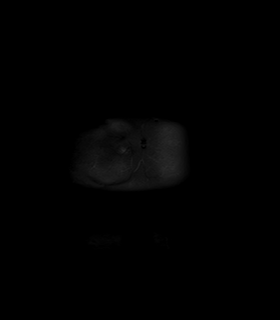

[Series 19: T1 dynamic fat-sat post-contrast · axial · 3.0mm · 1.19mm/px · z∈[-128,+109]mm · 3 of 80 slices shown (4 of 4)]
[im 1/80]
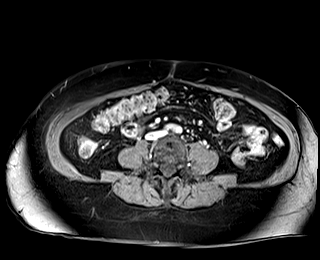
[im 40/80]
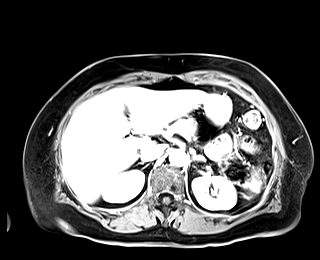
[im 80/80]
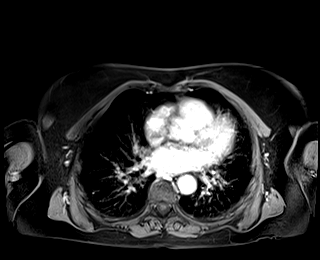

[Series 20: T1 dynamic fat-sat · axial · 3.0mm · 1.19mm/px · z∈[-128,+109]mm · 3 of 80 slices shown (5 of 5)]
[im 1/80]
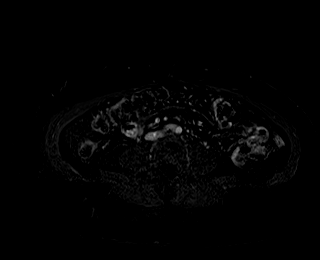
[im 40/80]
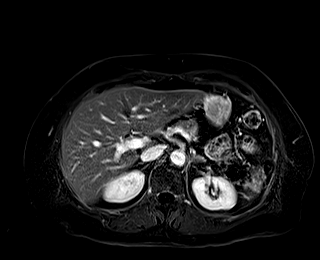
[im 80/80]
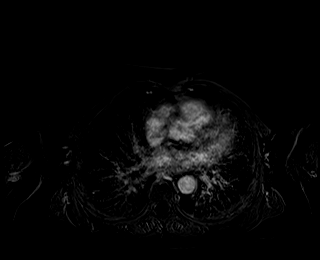

[48 of 48 positions shown; findings below may reference images not displayed]

FINDINGS: Lower chest: No acute findings.

Hepatobiliary: Multiple hepatic cysts are again seen some containing
thin internal septations. Largest in the dome of the right lobe
measures 9.5 cm, and these show no significant change compared to
prior CT in 5150. No enhancing nodules or solid components are
identified. Gallbladder is unremarkable. No evidence of biliary
ductal dilatation.

Pancreas:  No mass or inflammatory changes.

Spleen:  Within normal limits in size and appearance.

Adrenals/Urinary Tract: No masses identified. No evidence of
hydronephrosis.

Stomach/Bowel: Large distal duodenal diverticulum measuring
approximately 5 cm. Otherwise unremarkable.

Vascular/Lymphatic: No pathologically enlarged lymph nodes
identified. No abdominal aortic aneurysm.

Other:  None.

Musculoskeletal:  No suspicious bone lesions identified.
IMPRESSION: Stable benign-appearing hepatic cysts, largest measuring 9.5 cm. No
evidence of hepatic neoplasm or other acute findings.

Stable large distal duodenal diverticulum.

## 2022-06-18 ENCOUNTER — Ambulatory Visit: Payer: 59 | Admitting: Nurse Practitioner

## 2022-06-28 DIAGNOSIS — H401132 Primary open-angle glaucoma, bilateral, moderate stage: Secondary | ICD-10-CM | POA: Diagnosis not present

## 2022-06-28 DIAGNOSIS — H26491 Other secondary cataract, right eye: Secondary | ICD-10-CM | POA: Diagnosis not present

## 2022-06-30 ENCOUNTER — Ambulatory Visit: Payer: 59 | Admitting: Nurse Practitioner

## 2022-07-06 ENCOUNTER — Ambulatory Visit: Payer: 59 | Admitting: Nurse Practitioner

## 2022-07-23 ENCOUNTER — Encounter: Payer: Self-pay | Admitting: Nurse Practitioner

## 2022-07-23 ENCOUNTER — Ambulatory Visit (INDEPENDENT_AMBULATORY_CARE_PROVIDER_SITE_OTHER): Payer: 59 | Admitting: Nurse Practitioner

## 2022-07-23 VITALS — BP 130/54 | HR 73 | Temp 97.0°F | Resp 16 | Ht 63.0 in | Wt 162.6 lb

## 2022-07-23 DIAGNOSIS — R946 Abnormal results of thyroid function studies: Secondary | ICD-10-CM | POA: Diagnosis not present

## 2022-07-23 DIAGNOSIS — N1831 Chronic kidney disease, stage 3a: Secondary | ICD-10-CM | POA: Diagnosis not present

## 2022-07-23 DIAGNOSIS — E538 Deficiency of other specified B group vitamins: Secondary | ICD-10-CM

## 2022-07-23 DIAGNOSIS — E559 Vitamin D deficiency, unspecified: Secondary | ICD-10-CM

## 2022-07-23 DIAGNOSIS — I700 Atherosclerosis of aorta: Secondary | ICD-10-CM | POA: Diagnosis not present

## 2022-07-23 DIAGNOSIS — E1122 Type 2 diabetes mellitus with diabetic chronic kidney disease: Secondary | ICD-10-CM

## 2022-07-23 LAB — POCT GLYCOSYLATED HEMOGLOBIN (HGB A1C): Hemoglobin A1C: 6 % — AB (ref 4.0–5.6)

## 2022-07-23 MED ORDER — METFORMIN HCL 500 MG PO TABS: ORAL_TABLET | ORAL | 3 refills | Status: DC

## 2022-07-23 MED ORDER — ATORVASTATIN CALCIUM 10 MG PO TABS: ORAL_TABLET | ORAL | 1 refills | Status: DC

## 2022-07-23 NOTE — Progress Notes (Signed)
[facility] [place], [place] [address]  Internal MEDICINE  Office Visit Note  Patient Name: Stacey Boyd  [id number]  [id number]  Date of Service: 07/23/2022  Chief Complaint  Patient presents with   Diabetes    a1c    HPI Stacey Boyd presents for a follow-up visit for diabetes, and gout.  Diabetes -- A1c improved to 6.0 from 6.6. wokring on protion control and decreasing fatty foods.  Gout -- has not had any flares in a long time High cholesterol -- takes atorvastatin  Upcoming AWV, needs routine labs    Current Medication: Outpatient Encounter Medications as of 07/23/2022  Medication Sig   acetaminophen (TYLENOL) 650 MG CR tablet Take 650-1,300 mg by mouth every 8 (eight) hours as needed for pain.   aspirin EC 81 MG tablet Take 1 tablet (81 mg total) by mouth daily. Swallow whole. Please get OTC.   Cholecalciferol (VITAMIN D3) 5000 units TABS Take 5,000 Units by mouth daily.    colchicine 0.6 MG tablet ON FIRST DAY OF GOUT FLARE TAKE 2 TABLETS, THEN TAKE 1 ADIDTIONAL TABLET AN HOUR LATER. THEN TAKE 1 TABLET DAILY UNTIL FLARE RESOLVES.   dorzolamide-timolol (COSOPT) 22.3-6.8 MG/ML ophthalmic solution Place 1 drop into both eyes 2 (two) times daily.   Flaxseed, Linseed, (EQL FLAXSEED OIL) 1200 MG CAPS Take 1 capsule by mouth daily. Please get OTC   glimepiride (AMARYL) 1 MG tablet TAKE ONE TAB WITH LUNCH FOR DM   latanoprost (XALATAN) 0.005 % ophthalmic solution Place 1 drop into both eyes at bedtime.   mirtazapine (REMERON) 15 MG tablet Take 1 tablet (15 mg total) by mouth at bedtime.   risperiDONE (RISPERDAL M-TABS) 2 MG disintegrating tablet Take 1 tablet (2 mg total) by mouth at bedtime.   sertraline (ZOLOFT) 100 MG tablet Take 1 tablet (100 mg total) by mouth daily.   traZODone (DESYREL) 50 MG tablet TAKE 0.5-1 TABLET BY MOUTH AT BEDTIME AS NEEDED FOR SLEEP   [DISCONTINUED] atorvastatin (LIPITOR) 10 MG tablet TAKE 1 TABLET BY MOUTH AT BEDTIME FOR  CHOLESTEROL   [DISCONTINUED] COVID-19 mRNA bivalent vaccine, Pfizer, (PFIZER COVID-19 VAC BIVALENT) injection Inject into the muscle.   [DISCONTINUED] metFORMIN (GLUCOPHAGE) 500 MG tablet TAKE 1/2 TABLET BY MOUTH TWICE DAILY WITH A MEAL   atorvastatin (LIPITOR) 10 MG tablet TAKE 1 TABLET BY MOUTH AT BEDTIME FOR CHOLESTEROL   metFORMIN (GLUCOPHAGE) 500 MG tablet TAKE 1/2 TABLET BY MOUTH TWICE DAILY WITH A MEAL   No facility-administered encounter medications on file as of 07/23/2022.    Surgical History: Past Surgical History:  Procedure Laterality Date   ABDOMINAL HYSTERECTOMY  2005   APPENDECTOMY  1980   CARDIAC CATHETERIZATION Left 09/17/2015   Procedure: Left Heart Cath and Coronary Angiography;  Surgeon: Teodoro Spray, MD;  Location: [facility];  Service: Cardiovascular;  Laterality: Left;   CARPAL TUNNEL RELEASE     CATARACT EXTRACTION W/PHACO Left 09/25/2019   Procedure: CATARACT EXTRACTION PHACO AND INTRAOCULAR LENS PLACEMENT (IOC) LEFT   4.45  00:36.1;  Surgeon: Birder Robson, MD;  Location: [facility];  Service: Ophthalmology;  Laterality: Left;   CATARACT EXTRACTION W/PHACO Right 10/16/2019   Procedure: CATARACT EXTRACTION PHACO AND INTRAOCULAR LENS PLACEMENT (IOC)right  DIABETIC;  Surgeon: Birder Robson, MD;  Location: [facility];  Service: Ophthalmology;  Laterality: Right;  5.29 0:33.7   COLONOSCOPY WITH PROPOFOL N/A 11/07/2017   Procedure: COLONOSCOPY WITH PROPOFOL;  Surgeon: Jonathon Bellows, MD;  Location: [facility];  Service: Gastroenterology;  Laterality: N/A;   FOOT SURGERY Left    2 nodules removed   FRACTURE SURGERY Right    wrist   WRIST FRACTURE SURGERY Right     Medical History: Past Medical History:  Diagnosis Date   Allergy    Anxiety    Arthritis    legs   Cancer ([place])    skin cancer on right shoulder.    Depression    Diabetes mellitus without complication ([place])    Diverticulitis    Dyspnea    with exertion    Glaucoma    Hyperlipidemia    Hypertension    controlled on meds   Hypothyroidism    Liver cyst    Osteoporosis    Shoulder pain, left    and arm   Thyroid disease    Wrist fracture 2001   right    Family History: Family History  Problem Relation Age of Onset   Heart disease Mother        h/o rheumatic fever   Heart disease Father    Stroke Father    Diabetes Sister    Diabetes Brother    Diabetes Sister    Mental illness Other    Colon cancer Neg Hx    Breast cancer Neg Hx     Social History   Socioeconomic History   Marital status: Divorced    Spouse name: Not on file   Number of children: 3   Years of education: Not on file   Highest education level: 10th grade  Occupational History   Not on file  Tobacco Use   Smoking status: Former    Packs/day: 0.25    Years: 10.00    Total pack years: 2.50    Types: Cigarettes    Quit date: 09/17/1979    Years since quitting: 42.8   Smokeless tobacco: Never  Vaping Use   Vaping Use: Never used  Substance and Sexual Activity   Alcohol use: No   Drug use: No   Sexual activity: Yes    Partners: Male    Birth control/protection: None  Other Topics Concern   Not on file  Social History Narrative   3 kids, local   Lives with daughter as of 2016   Divorced ~2002   Social Determinants of Health   Financial Resource Strain: Low Risk  (12/29/2017)   Overall Financial Resource Strain (CARDIA)    Difficulty of Paying Living Expenses: Not hard at all  Food Insecurity: No Food Insecurity (12/29/2017)   Hunger Vital Sign    Worried About Running Out of Food in the Last Year: Never true    Ran Out of Food in the Last Year: Never true  Transportation Needs: No Transportation Needs (12/29/2017)   PRAPARE - Hydrologist (Medical): No    Lack of Transportation (Non-Medical): No  Physical Activity: Inactive (12/29/2017)   Exercise Vital Sign    Days of Exercise per Week: 0 days    Minutes of  Exercise per Session: 0 min  Stress: No Stress Concern Present (12/29/2017)   [place]    Feeling of Stress : Not at all  Social Connections: Unknown (12/29/2017)   Social Connection and Isolation Panel [NHANES]    Frequency of Communication with Friends and Family: Not on file    Frequency of Social Gatherings with Friends and Family: Not on file    Attends Religious Services: 1  to 4 times per year    Active Member of Clubs or Organizations: No    Attends Archivist Meetings: Never    Marital Status: Divorced  Human resources officer Violence: Not At Risk (12/29/2017)   Humiliation, Afraid, Rape, and Kick questionnaire    Fear of Current or Ex-Partner: No    Emotionally Abused: No    Physically Abused: No    Sexually Abused: No      Review of Systems  Constitutional:  Negative for chills, fatigue and unexpected weight change.  HENT:  Negative for congestion, rhinorrhea, sneezing and sore throat.   Eyes:  Negative for redness.  Respiratory:  Negative for cough, chest tightness and shortness of breath.   Cardiovascular:  Negative for chest pain and palpitations.  Gastrointestinal:  Negative for abdominal pain, constipation, diarrhea, nausea and vomiting.  Genitourinary:  Negative for dysuria and frequency.  Musculoskeletal:  Positive for arthralgias and myalgias. Negative for back pain, joint swelling and neck pain.  Skin:  Negative for rash.  Neurological: Negative.  Negative for tremors and numbness.  Hematological:  Negative for adenopathy. Does not bruise/bleed easily.  Psychiatric/Behavioral:  Positive for sleep disturbance. Negative for behavioral problems (Depression), dysphoric mood, hallucinations, self-injury and suicidal ideas. The patient is not nervous/anxious.     Vital Signs: BP (!) 130/54   Pulse 73   Temp (!) 97 F (36.1 C)   Resp 16   Ht '5\' 3"'$  (1.6 m)   Wt 162 lb 9.6 oz (73.8 kg)   SpO2  93%   BMI 28.80 kg/m    Physical Exam Vitals reviewed.  Constitutional:      General: She is not in acute distress.    Appearance: Normal appearance. She is not ill-appearing.  HENT:     Head: Normocephalic and atraumatic.  Eyes:     Pupils: Pupils are equal, round, and reactive to light.  Cardiovascular:     Rate and Rhythm: Normal rate and regular rhythm.  Pulmonary:     Effort: Pulmonary effort is normal. No respiratory distress.  Neurological:     Mental Status: She is alert and oriented to person, place, and time.  Psychiatric:        Mood and Affect: Mood normal.        Behavior: Behavior normal.        Assessment/Plan: 1. Type 2 diabetes mellitus with stage 3a chronic kidney disease, without long-term current use of insulin (HCC) A1c improving to 6.0. continue metformin as prescribed. Routine labs ordered.  - POCT glycosylated hemoglobin (Hb A1C) - Urine Microalbumin w/creat. ratio - metFORMIN (GLUCOPHAGE) 500 MG tablet; TAKE 1/2 TABLET BY MOUTH TWICE DAILY WITH A MEAL  Dispense: 90 tablet; Refill: 3 - CBC with Differential/Platelet - CMP14+EGFR - Lipid Profile - TSH + free T4 - B12 and Folate Panel - Vitamin D (25 hydroxy)  2. Aortic atherosclerosis ([place]) Routine labs ordered. Continue atorvastatin as prescribed. - atorvastatin (LIPITOR) 10 MG tablet; TAKE 1 TABLET BY MOUTH AT BEDTIME FOR CHOLESTEROL  Dispense: 90 tablet; Refill: 1 - CMP14+EGFR - Lipid Profile - TSH + free T4  3. Vitamin D deficiency Routine lab ordered - Vitamin D (25 hydroxy)  4. B12 deficiency Routine labs ordered - CBC with Differential/Platelet - B12 and Folate Panel  5. Abnormal thyroid function test Routine labs ordered - TSH + free T4   General Counseling: Stacey Boyd verbalizes understanding of the findings of todays visit and agrees with plan of treatment. I have discussed any further  diagnostic evaluation that may be needed or ordered today. We also reviewed her medications  today. she has been encouraged to call the office with any questions or concerns that should arise related to todays visit.    Orders Placed This Encounter  Procedures   Urine Microalbumin w/creat. ratio   CBC with Differential/Platelet   CMP14+EGFR   Lipid Profile   TSH + free T4   B12 and Folate Panel   Vitamin D (25 hydroxy)   POCT glycosylated hemoglobin (Hb A1C)    Meds ordered this encounter  Medications   metFORMIN (GLUCOPHAGE) 500 MG tablet    Sig: TAKE 1/2 TABLET BY MOUTH TWICE DAILY WITH A MEAL    Dispense:  90 tablet    Refill:  3   atorvastatin (LIPITOR) 10 MG tablet    Sig: TAKE 1 TABLET BY MOUTH AT BEDTIME FOR CHOLESTEROL    Dispense:  90 tablet    Refill:  1    Return in about 1 month (around 08/23/2022) for medicare annual exam due, please schedule 1 month from now, needs to get labs done before visit.   Total time spent:30 Minutes Time spent includes review of chart, medications, test results, and follow up plan with the patient.   [place] Controlled Substance Database was reviewed by me.  This patient was seen by Jonetta Osgood, FNP-C in collaboration with Dr. Clayborn Bigness as a part of collaborative care agreement.   Quran Vasco R. Valetta Fuller, MSN, FNP-C Internal medicine

## 2022-07-26 LAB — MICROALBUMIN / CREATININE URINE RATIO
Creatinine, Urine: 42.6 mg/dL
Microalb/Creat Ratio: <7 mg/g creat (ref 0–29)
Microalbumin, Urine: <3 ug/mL

## 2022-08-03 DIAGNOSIS — I700 Atherosclerosis of aorta: Secondary | ICD-10-CM | POA: Diagnosis not present

## 2022-08-03 DIAGNOSIS — E782 Mixed hyperlipidemia: Secondary | ICD-10-CM | POA: Diagnosis not present

## 2022-08-03 DIAGNOSIS — N1831 Chronic kidney disease, stage 3a: Secondary | ICD-10-CM | POA: Diagnosis not present

## 2022-08-03 DIAGNOSIS — E559 Vitamin D deficiency, unspecified: Secondary | ICD-10-CM | POA: Diagnosis not present

## 2022-08-03 DIAGNOSIS — E1122 Type 2 diabetes mellitus with diabetic chronic kidney disease: Secondary | ICD-10-CM | POA: Diagnosis not present

## 2022-08-04 LAB — CBC WITH DIFFERENTIAL/PLATELET
Basophils Absolute: 0 10*3/uL (ref 0.0–0.2)
Basos: 1 %
EOS (ABSOLUTE): 0.1 10*3/uL (ref 0.0–0.4)
Eos: 1 %
Hematocrit: 42.1 % (ref 34.0–46.6)
Hemoglobin: 14.2 g/dL (ref 11.1–15.9)
Immature Grans (Abs): 0 10*3/uL (ref 0.0–0.1)
Immature Granulocytes: 0 %
Lymphocytes Absolute: 2.1 10*3/uL (ref 0.7–3.1)
Lymphs: 31 %
MCH: 31.3 pg (ref 26.6–33.0)
MCHC: 33.7 g/dL (ref 31.5–35.7)
MCV: 93 fL (ref 79–97)
Monocytes Absolute: 0.4 10*3/uL (ref 0.1–0.9)
Monocytes: 6 %
Neutrophils Absolute: 4.2 10*3/uL (ref 1.4–7.0)
Neutrophils: 61 %
Platelets: 278 10*3/uL (ref 150–450)
RBC: 4.54 x10E6/uL (ref 3.77–5.28)
RDW: 12.4 % (ref 11.7–15.4)
WBC: 6.8 10*3/uL (ref 3.4–10.8)

## 2022-08-04 LAB — LIPID PANEL
Chol/HDL Ratio: 4.7 ratio — ABNORMAL HIGH (ref 0.0–4.4)
Cholesterol, Total: 179 mg/dL (ref 100–199)
HDL: 38 mg/dL — ABNORMAL LOW (ref >39)
LDL Chol Calc (NIH): 110 mg/dL — ABNORMAL HIGH (ref 0–99)
Triglycerides: 176 mg/dL — ABNORMAL HIGH (ref 0–149)
VLDL Cholesterol Cal: 31 mg/dL (ref 5–40)

## 2022-08-04 LAB — TSH+FREE T4
Free T4: 1.24 ng/dL (ref 0.82–1.77)
TSH: 3.28 u[IU]/mL (ref 0.450–4.500)

## 2022-08-04 LAB — CMP14+EGFR
ALT: 10 IU/L (ref 0–32)
AST: 13 IU/L (ref 0–40)
Albumin/Globulin Ratio: 2 (ref 1.2–2.2)
Albumin: 4.4 g/dL (ref 3.8–4.8)
Alkaline Phosphatase: 93 IU/L (ref 44–121)
BUN/Creatinine Ratio: 18 (ref 12–28)
BUN: 19 mg/dL (ref 8–27)
Bilirubin Total: 0.3 mg/dL (ref 0.0–1.2)
CO2: 24 mmol/L (ref 20–29)
Calcium: 9.3 mg/dL (ref 8.7–10.3)
Chloride: 106 mmol/L (ref 96–106)
Creatinine, Ser: 1.05 mg/dL — ABNORMAL HIGH (ref 0.57–1.00)
Globulin, Total: 2.2 g/dL (ref 1.5–4.5)
Glucose: 113 mg/dL — ABNORMAL HIGH (ref 70–99)
Potassium: 4.1 mmol/L (ref 3.5–5.2)
Sodium: 145 mmol/L — ABNORMAL HIGH (ref 134–144)
Total Protein: 6.6 g/dL (ref 6.0–8.5)
eGFR: 55 mL/min/{1.73_m2} — ABNORMAL LOW (ref >59)

## 2022-08-04 LAB — VITAMIN D 25 HYDROXY (VIT D DEFICIENCY, FRACTURES): Vit D, 25-Hydroxy: 98.6 ng/mL (ref 30.0–100.0)

## 2022-08-04 LAB — B12 AND FOLATE PANEL
Folate: 17.7 ng/mL (ref >3.0)
Vitamin B-12: 742 pg/mL (ref 232–1245)

## 2022-08-09 NOTE — Progress Notes (Signed)
Will discuss results at her upcoming appointment

## 2022-08-23 ENCOUNTER — Encounter: Payer: Self-pay | Admitting: Nurse Practitioner

## 2022-08-23 ENCOUNTER — Ambulatory Visit (INDEPENDENT_AMBULATORY_CARE_PROVIDER_SITE_OTHER): Payer: 59 | Admitting: Nurse Practitioner

## 2022-08-23 VITALS — BP 124/64 | HR 81 | Temp 97.1°F | Resp 16 | Ht 63.0 in | Wt 158.2 lb

## 2022-08-23 DIAGNOSIS — G2401 Drug induced subacute dyskinesia: Secondary | ICD-10-CM

## 2022-08-23 DIAGNOSIS — N1831 Chronic kidney disease, stage 3a: Secondary | ICD-10-CM

## 2022-08-23 DIAGNOSIS — I700 Atherosclerosis of aorta: Secondary | ICD-10-CM | POA: Diagnosis not present

## 2022-08-23 DIAGNOSIS — E1122 Type 2 diabetes mellitus with diabetic chronic kidney disease: Secondary | ICD-10-CM | POA: Diagnosis not present

## 2022-08-23 DIAGNOSIS — E559 Vitamin D deficiency, unspecified: Secondary | ICD-10-CM

## 2022-08-23 DIAGNOSIS — Z0001 Encounter for general adult medical examination with abnormal findings: Secondary | ICD-10-CM | POA: Diagnosis not present

## 2022-08-23 NOTE — Progress Notes (Signed)
[facility] [address] [place], [place] [id number]  Internal MEDICINE  Office Visit Note  Patient Name: Stacey Boyd  [id number] 23, 2047  [id number]  Date of Service: 08/23/2022  Chief Complaint  Patient presents with   Depression   Diabetes   Hypertension   Hyperlipidemia    HPI Stacey Boyd presents for an annual well visit and physical exam.  Well-appearing 77 y.o. female with mood disorder, high cholesterol and diabetes.  Routine CRC screening: done in 2019 Routine mammogram:declined, last mammogram was done in 2019 DEXA scan: done in 2016 foot exam: done today Labs: routine labs done, results discussed, cholesterol levels improved.  New or worsening pain: none Other concerns:tardive dyskinesia     08/23/2022    1:53 PM 06/26/2021    9:37 AM 06/20/2020    9:25 AM  MMSE - Mini Mental State Exam  Orientation to time 5 5 5   Orientation to Place 5 5 5   Registration 3 3 3   Attention/ Calculation 5 5 5   Recall 3 3 3   Language- name 2 objects 2 2 2   Language- repeat 1 1 1   Language- follow 3 step command 3 3 3   Language- read & follow direction 1 1 1   Write a sentence 0 1 1  Copy design 1 1 1   Total score 29 30 30     Functional Status Survey: Is the patient deaf or have difficulty hearing?: No Does the patient have difficulty seeing, even when wearing glasses/contacts?: Yes Does the patient have difficulty concentrating, remembering, or making decisions?: No Does the patient have difficulty walking or climbing stairs?: No Does the patient have difficulty dressing or bathing?: No Does the patient have difficulty doing errands alone such as visiting a doctor's office or shopping?: No     09/25/2021    9:43 AM 12/25/2021   10:31 AM 04/02/2022   10:28 AM 07/23/2022   10:24 AM 08/23/2022    1:51 PM  Fall Risk  Falls in the past year? 0 0 0 0 0  Was there an injury with Fall?   0 0 0  Fall Risk Category Calculator   0 0 0  Fall Risk Category (Retired)   Low     (RETIRED) Patient Fall Risk Level   Low fall risk    Patient at Risk for Falls Due to   No Fall Risks No Fall Risks No Fall Risks  Fall risk Follow up   Falls evaluation completed Falls evaluation completed Falls evaluation completed       07/23/2022   10:25 AM  Depression screen PHQ 2/9  Decreased Interest 0  Down, Depressed, Hopeless 0  PHQ - 2 Score 0        No data to display            Current Medication: Outpatient Encounter Medications as of 08/23/2022  Medication Sig   acetaminophen (TYLENOL) 650 MG CR tablet Take 650-1,300 mg by mouth every 8 (eight) hours as needed for pain.   aspirin EC 81 MG tablet Take 1 tablet (81 mg total) by mouth daily. Swallow whole. Please get OTC.   atorvastatin (LIPITOR) 10 MG tablet TAKE 1 TABLET BY MOUTH AT BEDTIME FOR CHOLESTEROL   Cholecalciferol (VITAMIN D3) 5000 units TABS Take 5,000 Units by mouth daily.    colchicine 0.6 MG tablet ON FIRST DAY OF GOUT FLARE TAKE 2 TABLETS, THEN TAKE 1 ADIDTIONAL TABLET AN HOUR LATER. THEN TAKE 1 TABLET DAILY UNTIL FLARE RESOLVES.   dorzolamide-timolol (COSOPT)  22.3-6.8 MG/ML ophthalmic solution Place 1 drop into both eyes 2 (two) times daily.   Flaxseed, Linseed, (EQL FLAXSEED OIL) 1200 MG CAPS Take 1 capsule by mouth daily. Please get OTC   glimepiride (AMARYL) 1 MG tablet TAKE ONE TAB WITH LUNCH FOR DM   latanoprost (XALATAN) 0.005 % ophthalmic solution Place 1 drop into both eyes at bedtime.   metFORMIN (GLUCOPHAGE) 500 MG tablet TAKE 1/2 TABLET BY MOUTH TWICE DAILY WITH A MEAL   mirtazapine (REMERON) 15 MG tablet Take 1 tablet (15 mg total) by mouth at bedtime.   risperiDONE (RISPERDAL M-TABS) 2 MG disintegrating tablet Take 1 tablet (2 mg total) by mouth at bedtime.   sertraline (ZOLOFT) 100 MG tablet Take 1 tablet (100 mg total) by mouth daily.   traZODone (DESYREL) 50 MG tablet TAKE 0.5-1 TABLET BY MOUTH AT BEDTIME AS NEEDED FOR SLEEP   No facility-administered encounter medications on file  as of 08/23/2022.    Surgical History: Past Surgical History:  Procedure Laterality Date   ABDOMINAL HYSTERECTOMY  2005   APPENDECTOMY  1980   CARDIAC CATHETERIZATION Left 09/17/2015   Procedure: Left Heart Cath and Coronary Angiography;  Surgeon: Dalia Heading, MD;  Location: [facility] INVASIVE CV LAB;  Service: Cardiovascular;  Laterality: Left;   CARPAL TUNNEL RELEASE     CATARACT EXTRACTION W/PHACO Left 09/25/2019   Procedure: CATARACT EXTRACTION PHACO AND INTRAOCULAR LENS PLACEMENT (IOC) LEFT   4.45  00:36.1;  Surgeon: Galen Manila, MD;  Location: [facility];  Service: Ophthalmology;  Laterality: Left;   CATARACT EXTRACTION W/PHACO Right 10/16/2019   Procedure: CATARACT EXTRACTION PHACO AND INTRAOCULAR LENS PLACEMENT (IOC)right  DIABETIC;  Surgeon: Galen Manila, MD;  Location: [facility];  Service: Ophthalmology;  Laterality: Right;  5.29 0:33.7   COLONOSCOPY WITH PROPOFOL N/A 11/07/2017   Procedure: COLONOSCOPY WITH PROPOFOL;  Surgeon: Wyline Mood, MD;  Location: [facility];  Service: Gastroenterology;  Laterality: N/A;   FOOT SURGERY Left    2 nodules removed   FRACTURE SURGERY Right    wrist   WRIST FRACTURE SURGERY Right     Medical History: Past Medical History:  Diagnosis Date   Allergy    Anxiety    Arthritis    legs   Cancer    skin cancer on right shoulder.    Depression    Diabetes mellitus without complication    Diverticulitis    Dyspnea    with exertion   Glaucoma    Hyperlipidemia    Hypertension    controlled on meds   Hypothyroidism    Liver cyst    Osteoporosis    Shoulder pain, left    and arm   Thyroid disease    Wrist fracture 2001   right    Family History: Family History  Problem Relation Age of Onset   Heart disease Mother        h/o rheumatic fever   Heart disease Father    Stroke Father    Diabetes Sister    Diabetes Brother    Diabetes Sister    Mental illness Other    Colon cancer Neg Hx    Breast  cancer Neg Hx     Social History   Socioeconomic History   Marital status: Divorced    Spouse name: Not on file   Number of children: 3   Years of education: Not on file   Highest education level: 10th grade  Occupational History   Not on file  Tobacco Use   Smoking status: Former    Packs/day: 0.25    Years: 10.00    Additional pack years: 0.00    Total pack years: 2.50    Types: Cigarettes    Quit date: 09/17/1979    Years since quitting: 42.9   Smokeless tobacco: Never  Vaping Use   Vaping Use: Never used  Substance and Sexual Activity   Alcohol use: No   Drug use: No   Sexual activity: Yes    Partners: Male    Birth control/protection: None  Other Topics Concern   Not on file  Social History Narrative   3 kids, local   Lives with daughter as of 2016   Divorced ~2002   Social Determinants of Health   Financial Resource Strain: Low Risk  (12/29/2017)   Overall Financial Resource Strain (CARDIA)    Difficulty of Paying Living Expenses: Not hard at all  Food Insecurity: No Food Insecurity (12/29/2017)   Hunger Vital Sign    Worried About Running Out of Food in the Last Year: Never true    Ran Out of Food in the Last Year: Never true  Transportation Needs: No Transportation Needs (12/29/2017)   PRAPARE - Administrator, Civil Service (Medical): No    Lack of Transportation (Non-Medical): No  Physical Activity: Inactive (12/29/2017)   Exercise Vital Sign    Days of Exercise per Week: 0 days    Minutes of Exercise per Session: 0 min  Stress: No Stress Concern Present (12/29/2017)   [employer] of Occupational Health - Occupational Stress Questionnaire    Feeling of Stress : Not at all  Social Connections: Unknown (12/29/2017)   Social Connection and Isolation Panel [NHANES]    Frequency of Communication with Friends and Family: Not on file    Frequency of Social Gatherings with Friends and Family: Not on file    Attends Religious Services: 1 to 4  times per year    Active Member of [employer] or Organizations: No    Attends Banker Meetings: Never    Marital Status: Divorced  Catering manager Violence: Not At Risk (12/29/2017)   Humiliation, Afraid, Rape, and Kick questionnaire    Fear of Current or Ex-Partner: No    Emotionally Abused: No    Physically Abused: No    Sexually Abused: No      Review of Systems  Constitutional:  Negative for activity change, appetite change, chills, fatigue, fever and unexpected weight change.  HENT: Negative.  Negative for congestion, ear pain, rhinorrhea, sneezing, sore throat and trouble swallowing.   Eyes: Negative.  Negative for redness.  Respiratory: Negative.  Negative for cough, chest tightness, shortness of breath and wheezing.   Cardiovascular: Negative.  Negative for chest pain and palpitations.  Gastrointestinal: Negative.  Negative for abdominal pain, blood in stool, constipation, diarrhea, nausea and vomiting.  Endocrine: Negative.   Genitourinary: Negative.  Negative for difficulty urinating, dysuria, frequency, hematuria and urgency.  Musculoskeletal: Negative.  Negative for arthralgias, back pain, joint swelling, myalgias and neck pain.  Skin: Negative.  Negative for rash and wound.  Allergic/Immunologic: Negative.  Negative for immunocompromised state.  Neurological: Negative.  Negative for dizziness, tremors, seizures, numbness and headaches.  Hematological: Negative.  Negative for adenopathy. Does not bruise/bleed easily.  Psychiatric/Behavioral:  Positive for depression. Negative for behavioral problems (Depression), self-injury, sleep disturbance and suicidal ideas. The patient is not nervous/anxious.     Vital Signs: BP 124/64   Pulse 81  Temp (!) 97.1 F (36.2 C)   Resp 16   Ht 5\' 3"  (1.6 m)   Wt 158 lb 3.2 oz (71.8 kg)   SpO2 96%   BMI 28.02 kg/m    Physical Exam Vitals reviewed.  Constitutional:      General: She is not in acute distress.     Appearance: Normal appearance. She is well-developed and normal weight. She is not ill-appearing or diaphoretic.  HENT:     Head: Normocephalic and atraumatic.     Right Ear: Tympanic membrane, ear canal and external ear normal.     Left Ear: Tympanic membrane, ear canal and external ear normal.     Nose: Nose normal. No congestion or rhinorrhea.     Mouth/Throat:     Mouth: Mucous membranes are moist.     Pharynx: Oropharynx is clear. No oropharyngeal exudate or posterior oropharyngeal erythema.  Eyes:     General: No scleral icterus.       Right eye: No discharge.        Left eye: No discharge.     Extraocular Movements: Extraocular movements intact.     Conjunctiva/sclera: Conjunctivae normal.     Pupils: Pupils are equal, round, and reactive to light.  Neck:     Thyroid: No thyromegaly.     Vascular: No JVD.     Trachea: No tracheal deviation.  Cardiovascular:     Rate and Rhythm: Normal rate and regular rhythm.     Pulses:          Dorsalis pedis pulses are 2+ on the right side and 2+ on the left side.       Posterior tibial pulses are 2+ on the right side and 2+ on the left side.     Heart sounds: Normal heart sounds. No murmur heard.    No friction rub. No gallop.  Pulmonary:     Effort: Pulmonary effort is normal. No respiratory distress.     Breath sounds: Normal breath sounds. No stridor. No wheezing or rales.  Chest:     Chest wall: No tenderness.  Abdominal:     General: Bowel sounds are normal. There is no distension.     Palpations: Abdomen is soft. There is no mass.     Tenderness: There is no abdominal tenderness. There is no guarding or rebound.  Musculoskeletal:        General: No tenderness or deformity. Normal range of motion.     Cervical back: Normal range of motion and neck supple.     Right foot: Normal range of motion. No deformity, bunion, Charcot foot, foot drop or prominent metatarsal heads.     Left foot: Normal range of motion. No deformity,  bunion, Charcot foot, foot drop or prominent metatarsal heads.  Feet:     Right foot:     Protective Sensation: 6 sites tested.  6 sites sensed.     Skin integrity: Dry skin present. No ulcer, blister, skin breakdown, erythema, warmth, callus or fissure.     Toenail Condition: Right toenails are normal.     Left foot:     Protective Sensation: 6 sites tested.  6 sites sensed.     Skin integrity: Dry skin present. No ulcer, blister, skin breakdown, erythema, warmth, callus or fissure.     Toenail Condition: Left toenails are normal.  Lymphadenopathy:     Cervical: No cervical adenopathy.  Skin:    General: Skin is warm and dry.  Coloration: Skin is not pale.     Findings: No erythema or rash.  Neurological:     Mental Status: She is alert and oriented to person, place, and time.     Cranial Nerves: No cranial nerve deficit.     Motor: No abnormal muscle tone.     Coordination: Coordination normal.     Gait: Gait normal.     Deep Tendon Reflexes: Reflexes are normal and symmetric.  Psychiatric:        Attention and Perception: Perception normal. She does not perceive auditory or visual hallucinations.        Mood and Affect: Mood and affect normal.        Behavior: Behavior normal. Behavior is cooperative.        Thought Content: Thought content normal. Thought content is not paranoid or delusional.        Judgment: Judgment normal.        Assessment/Plan: 1. Encounter for routine adult health examination with abnormal findings Age-appropriate preventive screenings and vaccinations discussed, annual physical exam completed. Routine labs for health maintenance results discussed with patient today. PHM updated.   2. Type 2 diabetes mellitus with stage 3a chronic kidney disease, without long-term current use of insulin Continue to monitor renal function. Stable at this time.   3. Aortic atherosclerosis Cholesterol levels are improving  4. Tardive dyskinesia Repetitive lip  and mouth movements. Currently taking risperdal, continue as prescribed.   5. Vitamin D deficiency Stable, resolved.        General Counseling: destenee guerry understanding of the findings of todays visit and agrees with plan of treatment. I have discussed any further diagnostic evaluation that may be needed or ordered today. We also reviewed her medications today. she has been encouraged to call the office with any questions or concerns that should arise related to todays visit.    No orders of the defined types were placed in this encounter.   No orders of the defined types were placed in this encounter.   Return in about 5 months (around 01/23/2023) for F/U, Recheck A1C, Stacey Boyd PCP.   Total time spent:30 Minutes Time spent includes review of chart, medications, test results, and follow up plan with the patient.   [place] Controlled Substance Database was reviewed by me.  This patient was seen by Sallyanne Kuster, FNP-C in collaboration with Dr. Beverely Risen as a part of collaborative care agreement.  Stacey Boyd R. Tedd Sias, MSN, FNP-C Internal medicine

## 2022-08-24 ENCOUNTER — Encounter: Payer: Self-pay | Admitting: Nurse Practitioner

## 2022-09-09 ENCOUNTER — Other Ambulatory Visit (HOSPITAL_COMMUNITY): Payer: Self-pay

## 2022-10-26 ENCOUNTER — Other Ambulatory Visit: Payer: Self-pay | Admitting: Nurse Practitioner

## 2022-10-26 DIAGNOSIS — E119 Type 2 diabetes mellitus without complications: Secondary | ICD-10-CM

## 2022-12-06 ENCOUNTER — Other Ambulatory Visit: Payer: Self-pay | Admitting: Nurse Practitioner

## 2022-12-06 DIAGNOSIS — Z760 Encounter for issue of repeat prescription: Secondary | ICD-10-CM

## 2022-12-17 ENCOUNTER — Encounter: Payer: Self-pay | Admitting: Nurse Practitioner

## 2022-12-17 ENCOUNTER — Ambulatory Visit (INDEPENDENT_AMBULATORY_CARE_PROVIDER_SITE_OTHER): Payer: 59 | Admitting: Nurse Practitioner

## 2022-12-17 VITALS — BP 122/70 | HR 92 | Temp 97.6°F | Resp 16 | Ht 63.0 in | Wt 160.6 lb

## 2022-12-17 DIAGNOSIS — R42 Dizziness and giddiness: Secondary | ICD-10-CM

## 2022-12-17 DIAGNOSIS — H524 Presbyopia: Secondary | ICD-10-CM | POA: Diagnosis not present

## 2022-12-17 DIAGNOSIS — E1122 Type 2 diabetes mellitus with diabetic chronic kidney disease: Secondary | ICD-10-CM

## 2022-12-17 DIAGNOSIS — N1831 Chronic kidney disease, stage 3a: Secondary | ICD-10-CM

## 2022-12-17 DIAGNOSIS — R531 Weakness: Secondary | ICD-10-CM | POA: Diagnosis not present

## 2022-12-17 DIAGNOSIS — Z9181 History of falling: Secondary | ICD-10-CM | POA: Diagnosis not present

## 2022-12-17 DIAGNOSIS — R2681 Unsteadiness on feet: Secondary | ICD-10-CM

## 2022-12-17 DIAGNOSIS — R5381 Other malaise: Secondary | ICD-10-CM | POA: Diagnosis not present

## 2022-12-17 NOTE — Progress Notes (Signed)
[facility] [address] [place], [place] [address]  Internal MEDICINE  Office Visit Note  Patient Name: Stacey Boyd  [id number]  [id number]  Date of Service: 12/17/2022  Chief Complaint  Patient presents with   Acute Visit    Patient feels unsteady when standing, does not feel dizzy or lightheaded.   Weakness   Back Pain    Back and neck pain that are connected     HPI Stacey Boyd presents for an acute sick visit for unsteady, with back pain Feeling unsteady, dizzy, lightheaded and started about 1.5 months ago. Has been increase risk of falling.  Having increased back and neck pain.  No change in appetite, eating and drinking as normal No blood in stools, some loose stools but no diarrhea.  No change in urination BP ok, HR normal Requesting a home health aide.  No weight loss noted since previous office visit.  Reports decent sleep at night.  Needs transportation issue with medicaid fixed.     Current Medication:  Outpatient Encounter Medications as of 12/17/2022  Medication Sig   acetaminophen (TYLENOL) 650 MG CR tablet Take 650-1,300 mg by mouth every 8 (eight) hours as needed for pain.   aspirin EC 81 MG tablet Take 1 tablet (81 mg total) by mouth daily. Swallow whole. Please get OTC.   atorvastatin (LIPITOR) 10 MG tablet TAKE 1 TABLET BY MOUTH AT BEDTIME FOR CHOLESTEROL   Cholecalciferol (VITAMIN D3) 5000 units TABS Take 5,000 Units by mouth daily.    colchicine 0.6 MG tablet ON FIRST DAY OF GOUT FLARE TAKE 2 TABLETS, THEN TAKE 1 ADIDTIONAL TABLET AN HOUR LATER. THEN TAKE 1 TABLET DAILY UNTIL FLARE RESOLVES.   dorzolamide-timolol (COSOPT) 22.3-6.8 MG/ML ophthalmic solution Place 1 drop into both eyes 2 (two) times daily.   Flaxseed, Linseed, (EQL FLAXSEED OIL) 1200 MG CAPS Take 1 capsule by mouth daily. Please get OTC   glimepiride (AMARYL) 1 MG tablet TAKE 1 TABLET BY MOUTH DAILY WITH LUNCH FOR DM   latanoprost (XALATAN) 0.005 % ophthalmic solution Place 1  drop into both eyes at bedtime.   metFORMIN (GLUCOPHAGE) 500 MG tablet TAKE 1/2 TABLET BY MOUTH TWICE DAILY WITH A MEAL   mirtazapine (REMERON) 15 MG tablet Take 1 tablet (15 mg total) by mouth at bedtime.   risperiDONE (RISPERDAL M-TABS) 2 MG disintegrating tablet DISSOLVE 1 TABLET UNDER THE TONGUE AT BEDTIME   sertraline (ZOLOFT) 100 MG tablet Take 1 tablet (100 mg total) by mouth daily.   traZODone (DESYREL) 50 MG tablet TAKE 0.5-1 TABLET BY MOUTH AT BEDTIME AS NEEDED FOR SLEEP   No facility-administered encounter medications on file as of 12/17/2022.      Medical History: Past Medical History:  Diagnosis Date   Allergy    Anxiety    Arthritis    legs   Cancer (HCC)    skin cancer on right shoulder.    Depression    Diabetes mellitus without complication (HCC)    Diverticulitis    Dyspnea    with exertion   Glaucoma    Hyperlipidemia    Hypertension    controlled on meds   Hypothyroidism    Liver cyst    Osteoporosis    Shoulder pain, left    and arm   Thyroid disease    Wrist fracture 2001   right     Vital Signs: BP 122/70   Pulse 92   Temp 97.6 F (36.4 C)   Resp 16  Ht 5\' 3"  (1.6 m)   Wt 160 lb 9.6 oz (72.8 kg)   SpO2 97%   BMI 28.45 kg/m    Review of Systems  Constitutional:  Positive for activity change, appetite change and fatigue. Negative for chills, fever and unexpected weight change.  HENT: Negative.  Negative for congestion, ear pain, rhinorrhea, sneezing, sore throat and trouble swallowing.   Eyes: Negative.  Negative for redness.  Respiratory:  Positive for shortness of breath. Negative for cough, chest tightness and wheezing.   Cardiovascular: Negative.  Negative for chest pain and palpitations.  Gastrointestinal: Negative.  Negative for abdominal pain, blood in stool, constipation, diarrhea, nausea and vomiting.  Endocrine: Negative.   Genitourinary: Negative.  Negative for difficulty urinating, dysuria, frequency, hematuria and urgency.   Musculoskeletal:  Positive for arthralgias, back pain, gait problem and neck pain. Negative for joint swelling and myalgias.  Skin: Negative.  Negative for rash and wound.  Allergic/Immunologic: Negative.  Negative for immunocompromised state.  Neurological:  Positive for dizziness and weakness. Negative for tremors, seizures, numbness and headaches.  Hematological: Negative.  Negative for adenopathy. Does not bruise/bleed easily.  Psychiatric/Behavioral:  Positive for behavioral problems (Depression), decreased concentration and sleep disturbance. Negative for self-injury and suicidal ideas. The patient is nervous/anxious.     Physical Exam Vitals reviewed.  Constitutional:      General: She is awake. She is not in acute distress.    Appearance: Normal appearance. She is normal weight. She is not ill-appearing.  HENT:     Head: Normocephalic and atraumatic.  Eyes:     Pupils: Pupils are equal, round, and reactive to light.  Cardiovascular:     Rate and Rhythm: Normal rate and regular rhythm.  Pulmonary:     Effort: Pulmonary effort is normal. No respiratory distress.  Neurological:     Mental Status: She is alert and oriented to person, place, and time.  Psychiatric:        Attention and Perception: She is inattentive.        Mood and Affect: Mood is depressed. Affect is blunt.        Speech: Speech is delayed.        Behavior: Behavior is slowed and withdrawn. Behavior is cooperative.        Thought Content: Thought content is not paranoid or delusional. Thought content does not include homicidal or suicidal ideation.        Cognition and Memory: Cognition is not impaired. Memory is impaired.        Judgment: Judgment normal. Judgment is not impulsive.       Assessment/Plan: 1. Generalized weakness DME cane ordered. Home health aide paperwork completed. Labs ordered for further evaluation  - CMP14+EGFR - CBC with Differential/Platelet - Hgb A1C w/o eAG - For home use  only DME Cane  2. Declining functional status DME cane ordered. Home health aide paperwork completed. Labs ordered for further evaluation  - CMP14+EGFR - CBC with Differential/Platelet - Hgb A1C w/o eAG - For home use only DME Cane  3. Type 2 diabetes mellitus with stage 3a chronic kidney disease, without long-term current use of insulin (HCC) Repeat A1c for diabetes monitoring.  - Hgb A1C w/o eAG  4. Unsteady gait DME cane ordered. Home health aide paperwork completed. Labs ordered for further evaluation  - CMP14+EGFR - CBC with Differential/Platelet - Hgb A1C w/o eAG - For home use only DME Cane  5. Bilateral presbyopia DME cane ordered.  - For home use only  DME Cane  6. Dizziness Labs ordered for further evaluation.  - CMP14+EGFR - CBC with Differential/Platelet - Hgb A1C w/o eAG  7. At risk for falls DME order for cane, labs ordered for further evaluation. Paperwork completed for home health aide.  - CMP14+EGFR - CBC with Differential/Platelet - Hgb A1C w/o eAG - For home use only DME Cane   General Counseling: skyela bevels understanding of the findings of todays visit and agrees with plan of treatment. I have discussed any further diagnostic evaluation that may be needed or ordered today. We also reviewed her medications today. she has been encouraged to call the office with any questions or concerns that should arise related to todays visit.    Counseling:    Orders Placed This Encounter  Procedures   For home use only DME Cane   CMP14+EGFR   CBC with Differential/Platelet   Hgb A1C w/o eAG    No orders of the defined types were placed in this encounter.   Return in about 1 month (around 01/17/2023) for F/U, Livie Vanderhoof PCP, Labs.  [place] Controlled Substance Database was reviewed by me for overdose risk score (ORS)  Time spent:30 Minutes Time spent with patient included reviewing progress notes, labs, imaging studies, and discussing plan for follow up.    This patient was seen by Sallyanne Kuster, FNP-C in collaboration with Dr. Beverely Risen as a part of collaborative care agreement.  Ysidra Sopher R. Tedd Sias, MSN, FNP-C Internal Medicine

## 2022-12-20 ENCOUNTER — Telehealth: Payer: Self-pay

## 2022-12-20 NOTE — Telephone Encounter (Addendum)
Sent message to [facility]  and also called that we placed referral in process confirmed by palliative care

## 2022-12-20 NOTE — Telephone Encounter (Signed)
Faxed [facility] for 4 prolonged cane

## 2022-12-21 DIAGNOSIS — R531 Weakness: Secondary | ICD-10-CM | POA: Diagnosis not present

## 2022-12-21 DIAGNOSIS — Z9181 History of falling: Secondary | ICD-10-CM | POA: Diagnosis not present

## 2022-12-21 DIAGNOSIS — E1122 Type 2 diabetes mellitus with diabetic chronic kidney disease: Secondary | ICD-10-CM | POA: Diagnosis not present

## 2022-12-21 DIAGNOSIS — R2681 Unsteadiness on feet: Secondary | ICD-10-CM | POA: Diagnosis not present

## 2022-12-21 DIAGNOSIS — R42 Dizziness and giddiness: Secondary | ICD-10-CM | POA: Diagnosis not present

## 2022-12-21 DIAGNOSIS — R5381 Other malaise: Secondary | ICD-10-CM | POA: Diagnosis not present

## 2022-12-27 DIAGNOSIS — H401132 Primary open-angle glaucoma, bilateral, moderate stage: Secondary | ICD-10-CM | POA: Diagnosis not present

## 2022-12-29 ENCOUNTER — Telehealth: Payer: Self-pay

## 2022-12-29 NOTE — Telephone Encounter (Signed)
[facility] for [facility] called to let us know Permelia declined palliative care and wants an aid in the house. [phone]

## 2022-12-31 ENCOUNTER — Encounter: Payer: Self-pay | Admitting: Nurse Practitioner

## 2023-01-01 ENCOUNTER — Other Ambulatory Visit: Payer: Self-pay | Admitting: Nurse Practitioner

## 2023-01-01 DIAGNOSIS — Z760 Encounter for issue of repeat prescription: Secondary | ICD-10-CM

## 2023-01-03 ENCOUNTER — Telehealth: Payer: Self-pay

## 2023-01-03 DIAGNOSIS — M3501 Sicca syndrome with keratoconjunctivitis: Secondary | ICD-10-CM | POA: Diagnosis not present

## 2023-01-03 DIAGNOSIS — H401132 Primary open-angle glaucoma, bilateral, moderate stage: Secondary | ICD-10-CM | POA: Diagnosis not present

## 2023-01-03 DIAGNOSIS — H26491 Other secondary cataract, right eye: Secondary | ICD-10-CM | POA: Diagnosis not present

## 2023-01-03 DIAGNOSIS — H43813 Vitreous degeneration, bilateral: Secondary | ICD-10-CM | POA: Diagnosis not present

## 2023-01-03 NOTE — Telephone Encounter (Signed)
[facility]axed [facility] for home health aide

## 2023-01-24 ENCOUNTER — Ambulatory Visit (INDEPENDENT_AMBULATORY_CARE_PROVIDER_SITE_OTHER): Payer: 59 | Admitting: Nurse Practitioner

## 2023-01-24 ENCOUNTER — Encounter: Payer: Self-pay | Admitting: Nurse Practitioner

## 2023-01-24 VITALS — BP 138/72 | HR 95 | Temp 98.0°F | Resp 16 | Ht 63.0 in | Wt 162.2 lb

## 2023-01-24 DIAGNOSIS — E1122 Type 2 diabetes mellitus with diabetic chronic kidney disease: Secondary | ICD-10-CM

## 2023-01-24 DIAGNOSIS — R531 Weakness: Secondary | ICD-10-CM

## 2023-01-24 DIAGNOSIS — R5381 Other malaise: Secondary | ICD-10-CM | POA: Diagnosis not present

## 2023-01-24 DIAGNOSIS — M159 Polyosteoarthritis, unspecified: Secondary | ICD-10-CM | POA: Diagnosis not present

## 2023-01-24 DIAGNOSIS — N1831 Chronic kidney disease, stage 3a: Secondary | ICD-10-CM

## 2023-01-24 DIAGNOSIS — R2681 Unsteadiness on feet: Secondary | ICD-10-CM | POA: Diagnosis not present

## 2023-01-24 MED ORDER — METFORMIN HCL ER 500 MG PO TB24
500.0000 mg | ORAL_TABLET | Freq: Every day | ORAL | 1 refills | Status: DC
Start: 2023-01-24 — End: 2023-04-23

## 2023-01-24 MED ORDER — DICLOFENAC SODIUM 75 MG PO TBEC
75.0000 mg | DELAYED_RELEASE_TABLET | Freq: Two times a day (BID) | ORAL | 3 refills | Status: DC
Start: 2023-01-24 — End: 2023-05-23

## 2023-01-24 NOTE — Progress Notes (Signed)
[facility] [address] [place], [place] [address]  Internal MEDICINE  Office Visit Note  Patient Name: Stacey Boyd  [id number]  [id number]  Date of Service: 01/24/2023  Chief Complaint  Patient presents with   Depression   Diabetes   Hypertension   Hyperlipidemia   Follow-up    HPI Stacey Boyd presents for a follow-up visit for diabetes, hypertension, back pain, generalized weakness. Back pain -- taking tylenol sometimes which does help.  Low sugars -- take immediate release metformin but does not always eat enough.  Moving in with family at end of the montth, patient is excited change Unsteady gait, generalized weakness.     Current Medication: Outpatient Encounter Medications as of 01/24/2023  Medication Sig   acetaminophen (TYLENOL) 650 MG CR tablet Take 650-1,300 mg by mouth every 8 (eight) hours as needed for pain.   aspirin EC 81 MG tablet Take 1 tablet (81 mg total) by mouth daily. Swallow whole. Please get OTC.   atorvastatin (LIPITOR) 10 MG tablet TAKE 1 TABLET BY MOUTH AT BEDTIME FOR CHOLESTEROL   Cholecalciferol (VITAMIN D3) 5000 units TABS Take 5,000 Units by mouth daily.    colchicine 0.6 MG tablet ON FIRST DAY OF GOUT FLARE TAKE 2 TABLETS, THEN TAKE 1 ADIDTIONAL TABLET AN HOUR LATER. THEN TAKE 1 TABLET DAILY UNTIL FLARE RESOLVES.   diclofenac (VOLTAREN) 75 MG EC tablet Take 1 tablet (75 mg total) by mouth 2 (two) times daily.   dorzolamide-timolol (COSOPT) 22.3-6.8 MG/ML ophthalmic solution Place 1 drop into both eyes 2 (two) times daily.   Flaxseed, Linseed, (EQL FLAXSEED OIL) 1200 MG CAPS Take 1 capsule by mouth daily. Please get OTC   latanoprost (XALATAN) 0.005 % ophthalmic solution Place 1 drop into both eyes at bedtime.   metFORMIN (GLUCOPHAGE-XR) 500 MG 24 hr tablet Take 1 tablet (500 mg total) by mouth daily with breakfast.   mirtazapine (REMERON) 15 MG tablet Take 1 tablet (15 mg total) by mouth at bedtime.   risperiDONE (RISPERDAL M-TABS) 2  MG disintegrating tablet DISSOLVE 1 TABLET UNDER THE TONGUE AT BEDTIME   sertraline (ZOLOFT) 100 MG tablet Take 1 tablet (100 mg total) by mouth daily.   traZODone (DESYREL) 50 MG tablet TAKE 1/2-1 TABLET BY MOUTH AT BEDTIME ASNEEDED FOR SLEEP   [DISCONTINUED] glimepiride (AMARYL) 1 MG tablet TAKE 1 TABLET BY MOUTH DAILY WITH LUNCH FOR DM   [DISCONTINUED] metFORMIN (GLUCOPHAGE) 500 MG tablet TAKE 1/2 TABLET BY MOUTH TWICE DAILY WITH A MEAL   [DISCONTINUED] traZODone (DESYREL) 50 MG tablet TAKE 0.5-1 TABLET BY MOUTH AT BEDTIME AS NEEDED FOR SLEEP   No facility-administered encounter medications on file as of 01/24/2023.    Surgical History: Past Surgical History:  Procedure Laterality Date   ABDOMINAL HYSTERECTOMY  2005   APPENDECTOMY  1980   CARDIAC CATHETERIZATION Left 09/17/2015   Procedure: Left Heart Cath and Coronary Angiography;  Surgeon: Dalia Heading, MD;  Location: [facility] INVASIVE CV LAB;  Service: Cardiovascular;  Laterality: Left;   CARPAL TUNNEL RELEASE     CATARACT EXTRACTION W/PHACO Left 09/25/2019   Procedure: CATARACT EXTRACTION PHACO AND INTRAOCULAR LENS PLACEMENT (IOC) LEFT   4.45  00:36.1;  Surgeon: Galen Manila, MD;  Location: [facility];  Service: Ophthalmology;  Laterality: Left;   CATARACT EXTRACTION W/PHACO Right 10/16/2019   Procedure: CATARACT EXTRACTION PHACO AND INTRAOCULAR LENS PLACEMENT (IOC)right  DIABETIC;  Surgeon: Galen Manila, MD;  Location: [facility];  Service: Ophthalmology;  Laterality: Right;  5.29 0:33.7  COLONOSCOPY WITH PROPOFOL N/A 11/07/2017   Procedure: COLONOSCOPY WITH PROPOFOL;  Surgeon: Wyline Mood, MD;  Location: [facility] END[facility];  Service: Gastroenterology;  Laterality: N/A;   FOOT SURGERY Left    2 nodules removed   FRACTURE SURGERY Right    wrist   WRIST FRACTURE SURGERY Right     Medical History: Past Medical History:  Diagnosis Date   Allergy    Anxiety    Arthritis    legs   Cancer (HCC)    skin cancer  on right shoulder.    Depression    Diabetes mellitus without complication (HCC)    Diverticulitis    Dyspnea    with exertion   Glaucoma    Hyperlipidemia    Hypertension    controlled on meds   Hypothyroidism    Liver cyst    Osteoporosis    Shoulder pain, left    and arm   Thyroid disease    Wrist fracture 2001   right    Family History: Family History  Problem Relation Age of Onset   Heart disease Mother        h/o rheumatic fever   Heart disease Father    Stroke Father    Diabetes Sister    Diabetes Brother    Diabetes Sister    Mental illness Other    Colon cancer Neg Hx    Breast cancer Neg Hx     Social History   Socioeconomic History   Marital status: Divorced    Spouse name: Not on file   Number of children: 3   Years of education: Not on file   Highest education level: 10th grade  Occupational History   Not on file  Tobacco Use   Smoking status: Former    Current packs/day: 0.00    Average packs/day: 0.3 packs/day for 10.0 years (2.5 ttl pk-yrs)    Types: Cigarettes    Start date: 09/16/1969    Quit date: 09/17/1979    Years since quitting: 43.3   Smokeless tobacco: Never  Vaping Use   Vaping status: Never Used  Substance and Sexual Activity   Alcohol use: No   Drug use: No   Sexual activity: Yes    Partners: Male    Birth control/protection: None  Other Topics Concern   Not on file  Social History Narrative   3 kids, local   Lives with daughter as of 2016   Divorced ~2002   Social Determinants of Health   Financial Resource Strain: Low Risk  (12/29/2017)   Overall Financial Resource Strain (CARDIA)    Difficulty of Paying Living Expenses: Not hard at all  Food Insecurity: No Food Insecurity (12/29/2017)   Hunger Vital Sign    Worried About Running Out of Food in the Last Year: Never true    Ran Out of Food in the Last Year: Never true  Transportation Needs: No Transportation Needs (12/29/2017)   PRAPARE - Scientist, research (physical sciences) (Medical): No    Lack of Transportation (Non-Medical): No  Physical Activity: Inactive (12/29/2017)   Exercise Vital Sign    Days of Exercise per Week: 0 days    Minutes of Exercise per Session: 0 min  Stress: No Stress Concern Present (12/29/2017)   [employer] of Occupational Health - Occupational Stress Questionnaire    Feeling of Stress : Not at all  Social Connections: Unknown (12/29/2017)   Social Connection and Isolation Panel [NHANES]    Frequency of  Communication with Friends and Family: Not on file    Frequency of Social Gatherings with Friends and Family: Not on file    Attends Religious Services: 1 to 4 times per year    Active Member of [employer] or Organizations: No    Attends Banker Meetings: Never    Marital Status: Divorced  Catering manager Violence: Not At Risk (12/29/2017)   Humiliation, Afraid, Rape, and Kick questionnaire    Fear of Current or Ex-Partner: No    Emotionally Abused: No    Physically Abused: No    Sexually Abused: No      Review of Systems  Constitutional:  Positive for activity change, appetite change and fatigue. Negative for chills, fever and unexpected weight change.  HENT: Negative.  Negative for congestion, ear pain, rhinorrhea, sneezing, sore throat and trouble swallowing.   Eyes: Negative.  Negative for redness.  Respiratory:  Positive for shortness of breath. Negative for cough, chest tightness and wheezing.   Cardiovascular: Negative.  Negative for chest pain and palpitations.  Gastrointestinal: Negative.  Negative for abdominal pain, blood in stool, constipation, diarrhea, nausea and vomiting.  Endocrine: Negative.   Genitourinary: Negative.  Negative for difficulty urinating, dysuria, frequency, hematuria and urgency.  Musculoskeletal:  Positive for arthralgias, back pain, gait problem and neck pain. Negative for joint swelling and myalgias.  Skin: Negative.  Negative for rash and wound.   Allergic/Immunologic: Negative.  Negative for immunocompromised state.  Neurological:  Positive for dizziness and weakness. Negative for tremors, seizures, numbness and headaches.  Hematological: Negative.  Negative for adenopathy. Does not bruise/bleed easily.  Psychiatric/Behavioral:  Positive for behavioral problems (Depression), decreased concentration and sleep disturbance. Negative for self-injury and suicidal ideas. The patient is nervous/anxious.     Vital Signs: BP (!) 140/70   Pulse 95   Temp 98 F (36.7 C)   Resp 16   Ht 5\' 3"  (1.6 m)   Wt 162 lb 3.2 oz (73.6 kg)   SpO2 95%   BMI 28.73 kg/m    Physical Exam Vitals reviewed.  Constitutional:      General: She is awake. She is not in acute distress.    Appearance: Normal appearance. She is normal weight. She is not ill-appearing.  HENT:     Head: Normocephalic and atraumatic.  Eyes:     Pupils: Pupils are equal, round, and reactive to light.  Cardiovascular:     Rate and Rhythm: Normal rate and regular rhythm.  Pulmonary:     Effort: Pulmonary effort is normal. No respiratory distress.  Neurological:     Mental Status: She is alert and oriented to person, place, and time.  Psychiatric:        Attention and Perception: She is inattentive.        Mood and Affect: Mood is depressed. Affect is blunt.        Speech: Speech is delayed.        Behavior: Behavior is slowed and withdrawn. Behavior is cooperative.        Thought Content: Thought content is not paranoid or delusional. Thought content does not include homicidal or suicidal ideation.        Cognition and Memory: Cognition is not impaired. Memory is impaired.        Judgment: Judgment normal. Judgment is not impulsive.        Assessment/Plan: 1. Type 2 diabetes mellitus with stage 3a chronic kidney disease, without long-term current use of insulin (HCC) Metformin changed extended release,  follow up in month  - metFORMIN (GLUCOPHAGE-XR) 500 MG 24 hr  tablet; Take 1 tablet (500 mg total) by mouth daily with breakfast.  Dispense: 90 tablet; Refill: 1  2. Primary osteoarthritis involving multiple joints Start diclofenac twice daily as prescribed, may still tylenol as needed.  - diclofenac (VOLTAREN) 75 MG EC tablet; Take 1 tablet (75 mg total) by mouth 2 (two) times daily.  Dispense: 60 tablet; Refill: 3  3. Generalized weakness Referred to neurology  - Ambulatory referral to Neurology  4. Declining functional status Referred to neurology - Ambulatory referral to Neurology  5. Unsteady gait Referred to neurology  - Ambulatory referral to Neurology   General Counseling: lacey vender understanding of the findings of todays visit and agrees with plan of treatment. I have discussed any further diagnostic evaluation that may be needed or ordered today. We also reviewed her medications today. she has been encouraged to call the office with any questions or concerns that should arise related to todays visit.    Orders Placed This Encounter  Procedures   Ambulatory referral to Neurology    Meds ordered this encounter  Medications   diclofenac (VOLTAREN) 75 MG EC tablet    Sig: Take 1 tablet (75 mg total) by mouth 2 (two) times daily.    Dispense:  60 tablet    Refill:  3   metFORMIN (GLUCOPHAGE-XR) 500 MG 24 hr tablet    Sig: Take 1 tablet (500 mg total) by mouth daily with breakfast.    Dispense:  90 tablet    Refill:  1    Discontinue glimepiride and regular metformin, start metformin xr, fill script now.    Return in about 1 month (around 02/23/2023) for F/U, eval new med, Stacey Boyd PCP.   Total time spent:30 Minutes Time spent includes review of chart, medications, test results, and follow up plan with the patient.   [place] Controlled Substance Database was reviewed by me.  This patient was seen by Sallyanne Kuster, FNP-C in collaboration with Dr. Beverely Risen as a part of collaborative care agreement.   Sonoma Firkus R.  Tedd Sias, MSN, FNP-C Internal medicine

## 2023-01-25 ENCOUNTER — Telehealth: Payer: Self-pay | Admitting: Nurse Practitioner

## 2023-01-25 NOTE — Telephone Encounter (Signed)
Awaiting 01/24/23 office notes for Neurology referral-[facility]

## 2023-02-23 ENCOUNTER — Encounter: Payer: Self-pay | Admitting: Nurse Practitioner

## 2023-02-23 ENCOUNTER — Ambulatory Visit: Payer: 59 | Admitting: Nurse Practitioner

## 2023-02-23 VITALS — BP 138/72 | HR 89 | Temp 96.4°F | Resp 16 | Ht 63.0 in | Wt 160.0 lb

## 2023-02-23 DIAGNOSIS — M150 Primary generalized (osteo)arthritis: Secondary | ICD-10-CM | POA: Diagnosis not present

## 2023-02-23 DIAGNOSIS — E1122 Type 2 diabetes mellitus with diabetic chronic kidney disease: Secondary | ICD-10-CM

## 2023-02-23 DIAGNOSIS — Z23 Encounter for immunization: Secondary | ICD-10-CM

## 2023-02-23 DIAGNOSIS — N1831 Chronic kidney disease, stage 3a: Secondary | ICD-10-CM | POA: Diagnosis not present

## 2023-02-23 MED ORDER — CYCLOBENZAPRINE HCL 5 MG PO TABS
5.0000 mg | ORAL_TABLET | Freq: Every day | ORAL | 2 refills | Status: DC
Start: 2023-02-23 — End: 2023-04-18

## 2023-02-23 NOTE — Progress Notes (Addendum)
[facility] [address] [place], [place] [address]  Internal MEDICINE  Office Visit Note  Patient Name: Stacey Boyd  [id number]  [id number]  Date of Service: 02/23/2023  Chief Complaint  Patient presents with   Follow-up    Eval new med.     HPI Marcos presents for a follow-up visit for osteoarthritis, muscle spasms, and flu vaccine. Tried diclofenac but not sure if it is helping.  Along with joint pains, is also having muscle spasms. And musculoskeletal pain.  Requesting flu vaccine.  Glucose levels are doing well. Controlled on metformin.     Current Medication: Outpatient Encounter Medications as of 02/23/2023  Medication Sig   acetaminophen (TYLENOL) 650 MG CR tablet Take 650-1,300 mg by mouth every 8 (eight) hours as needed for pain.   aspirin EC 81 MG tablet Take 1 tablet (81 mg total) by mouth daily. Swallow whole. Please get OTC.   atorvastatin (LIPITOR) 10 MG tablet TAKE 1 TABLET BY MOUTH AT BEDTIME FOR CHOLESTEROL   Cholecalciferol (VITAMIN D3) 5000 units TABS Take 5,000 Units by mouth daily.    colchicine 0.6 MG tablet ON FIRST DAY OF GOUT FLARE TAKE 2 TABLETS, THEN TAKE 1 ADIDTIONAL TABLET AN HOUR LATER. THEN TAKE 1 TABLET DAILY UNTIL FLARE RESOLVES.   cyclobenzaprine (FLEXERIL) 5 MG tablet Take 1 tablet (5 mg total) by mouth at bedtime.   diclofenac (VOLTAREN) 75 MG EC tablet Take 1 tablet (75 mg total) by mouth 2 (two) times daily.   dorzolamide-timolol (COSOPT) 22.3-6.8 MG/ML ophthalmic solution Place 1 drop into both eyes 2 (two) times daily.   Flaxseed, Linseed, (EQL FLAXSEED OIL) 1200 MG CAPS Take 1 capsule by mouth daily. Please get OTC   latanoprost (XALATAN) 0.005 % ophthalmic solution Place 1 drop into both eyes at bedtime.   metFORMIN (GLUCOPHAGE-XR) 500 MG 24 hr tablet Take 1 tablet (500 mg total) by mouth daily with breakfast.   mirtazapine (REMERON) 15 MG tablet Take 1 tablet (15 mg total) by mouth at bedtime.   risperiDONE (RISPERDAL  M-TABS) 2 MG disintegrating tablet DISSOLVE 1 TABLET UNDER THE TONGUE AT BEDTIME   sertraline (ZOLOFT) 100 MG tablet Take 1 tablet (100 mg total) by mouth daily.   traZODone (DESYREL) 50 MG tablet TAKE 1/2-1 TABLET BY MOUTH AT BEDTIME ASNEEDED FOR SLEEP   No facility-administered encounter medications on file as of 02/23/2023.    Surgical History: Past Surgical History:  Procedure Laterality Date   ABDOMINAL HYSTERECTOMY  2005   APPENDECTOMY  1980   CARDIAC CATHETERIZATION Left 09/17/2015   Procedure: Left Heart Cath and Coronary Angiography;  Surgeon: Dalia Heading, MD;  Location: [facility];  Service: Cardiovascular;  Laterality: Left;   CARPAL TUNNEL RELEASE     CATARACT EXTRACTION W/PHACO Left 09/25/2019   Procedure: CATARACT EXTRACTION PHACO AND INTRAOCULAR LENS PLACEMENT (IOC) LEFT   4.45  00:36.1;  Surgeon: Galen Manila, MD;  Location: [facility];  Service: Ophthalmology;  Laterality: Left;   CATARACT EXTRACTION W/PHACO Right 10/16/2019   Procedure: CATARACT EXTRACTION PHACO AND INTRAOCULAR LENS PLACEMENT (IOC)right  DIABETIC;  Surgeon: Galen Manila, MD;  Location: [facility];  Service: Ophthalmology;  Laterality: Right;  5.29 0:33.7   COLONOSCOPY WITH PROPOFOL N/A 11/07/2017   Procedure: COLONOSCOPY WITH PROPOFOL;  Surgeon: Wyline Mood, MD;  Location: [facility];  Service: Gastroenterology;  Laterality: N/A;   FOOT SURGERY Left    2 nodules removed   FRACTURE SURGERY Right    wrist  WRIST FRACTURE SURGERY Right     Medical History: Past Medical History:  Diagnosis Date   Allergy    Anxiety    Arthritis    legs   Cancer (HCC)    skin cancer on right shoulder.    Depression    Diabetes mellitus without complication (HCC)    Diverticulitis    Dyspnea    with exertion   Glaucoma    Hyperlipidemia    Hypertension    controlled on meds   Hypothyroidism    Liver cyst    Osteoporosis    Shoulder pain, left    and arm   Thyroid  disease    Wrist fracture 2001   right    Family History: Family History  Problem Relation Age of Onset   Heart disease Mother        h/o rheumatic fever   Heart disease Father    Stroke Father    Diabetes Sister    Diabetes Brother    Diabetes Sister    Mental illness Other    Colon cancer Neg Hx    Breast cancer Neg Hx     Social History   Socioeconomic History   Marital status: Divorced    Spouse name: Not on file   Number of children: 3   Years of education: Not on file   Highest education level: 10th grade  Occupational History   Not on file  Tobacco Use   Smoking status: Former    Current packs/day: 0.00    Average packs/day: 0.3 packs/day for 10.0 years (2.5 ttl pk-yrs)    Types: Cigarettes    Start date: 09/16/1969    Quit date: 09/17/1979    Years since quitting: 43.4   Smokeless tobacco: Never  Vaping Use   Vaping status: Never Used  Substance and Sexual Activity   Alcohol use: No   Drug use: No   Sexual activity: Yes    Partners: Male    Birth control/protection: None  Other Topics Concern   Not on file  Social History Narrative   3 kids, local   Lives with daughter as of 2016   Divorced ~2002   Social Determinants of Health   Financial Resource Strain: Low Risk  (12/29/2017)   Overall Financial Resource Strain (CARDIA)    Difficulty of Paying Living Expenses: Not hard at all  Food Insecurity: No Food Insecurity (12/29/2017)   Hunger Vital Sign    Worried About Running Out of Food in the Last Year: Never true    Ran Out of Food in the Last Year: Never true  Transportation Needs: No Transportation Needs (12/29/2017)   PRAPARE - Administrator, Civil Service (Medical): No    Lack of Transportation (Non-Medical): No  Physical Activity: Inactive (12/29/2017)   Exercise Vital Sign    Days of Exercise per Week: 0 days    Minutes of Exercise per Session: 0 min  Stress: No Stress Concern Present (12/29/2017)   [employer] of  Occupational Health - Occupational Stress Questionnaire    Feeling of Stress : Not at all  Social Connections: Unknown (12/29/2017)   Social Connection and Isolation Panel [NHANES]    Frequency of Communication with Friends and Family: Not on file    Frequency of Social Gatherings with Friends and Family: Not on file    Attends Religious Services: 1 to 4 times per year    Active Member of Clubs or Organizations: No    Attends  Club or Organization Meetings: Never    Marital Status: Divorced  Catering manager Violence: Not At Risk (12/29/2017)   Humiliation, Afraid, Rape, and Kick questionnaire    Fear of Current or Ex-Partner: No    Emotionally Abused: No    Physically Abused: No    Sexually Abused: No      Review of Systems  Constitutional:  Positive for activity change, appetite change and fatigue. Negative for chills, fever and unexpected weight change.  HENT: Negative.  Negative for congestion, ear pain, rhinorrhea, sneezing, sore throat and trouble swallowing.   Eyes: Negative.  Negative for redness.  Respiratory:  Positive for shortness of breath. Negative for cough, chest tightness and wheezing.   Cardiovascular: Negative.  Negative for chest pain and palpitations.  Gastrointestinal: Negative.  Negative for abdominal pain, blood in stool, constipation, diarrhea, nausea and vomiting.  Endocrine: Negative.   Genitourinary: Negative.  Negative for difficulty urinating, dysuria, frequency, hematuria and urgency.  Musculoskeletal:  Positive for arthralgias, back pain, gait problem and neck pain. Negative for joint swelling and myalgias.  Skin: Negative.  Negative for rash and wound.  Allergic/Immunologic: Negative.  Negative for immunocompromised state.  Neurological:  Positive for dizziness and weakness. Negative for tremors, seizures, numbness and headaches.  Hematological: Negative.  Negative for adenopathy. Does not bruise/bleed easily.  Psychiatric/Behavioral:  Positive for  behavioral problems (Depression), decreased concentration and sleep disturbance. Negative for self-injury and suicidal ideas. The patient is nervous/anxious.     Vital Signs: BP 138/72   Pulse 89   Temp (!) 96.4 F (35.8 C)   Resp 16   Ht 5\' 3"  (1.6 m)   Wt 160 lb (72.6 kg)   SpO2 93%   BMI 28.34 kg/m    Physical Exam Vitals reviewed.  Constitutional:      General: She is awake. She is not in acute distress.    Appearance: Normal appearance. She is normal weight. She is not ill-appearing.  HENT:     Head: Normocephalic and atraumatic.  Eyes:     Pupils: Pupils are equal, round, and reactive to light.  Cardiovascular:     Rate and Rhythm: Normal rate and regular rhythm.  Pulmonary:     Effort: Pulmonary effort is normal. No respiratory distress.  Neurological:     Mental Status: She is alert and oriented to person, place, and time.  Psychiatric:        Attention and Perception: She is inattentive.        Mood and Affect: Mood is depressed. Affect is blunt.        Speech: Speech is delayed.        Behavior: Behavior is slowed and withdrawn. Behavior is cooperative.        Thought Content: Thought content is not paranoid or delusional. Thought content does not include homicidal or suicidal ideation.        Cognition and Memory: Cognition is not impaired. Memory is impaired.        Judgment: Judgment normal. Judgment is not impulsive.        Assessment/Plan: 1. Primary osteoarthritis involving multiple joints Try small dose of cyclobenzaprine at night only.  - cyclobenzaprine (FLEXERIL) 5 MG tablet; Take 1 tablet (5 mg total) by mouth at bedtime.  Dispense: 30 tablet; Refill: 2  2. Type 2 diabetes mellitus with stage 3a chronic kidney disease, without long-term current use of insulin (HCC) Stable, continue metformin as prescribed.   3. Needs flu shot Flu vaccine administered in office  today.  - Influenza, MDCK, trivalent, PF(Flucelvax egg-free)   General  Counseling: Ragine verbalizes understanding of the findings of todays visit and agrees with plan of treatment. I have discussed any further diagnostic evaluation that may be needed or ordered today. We also reviewed her medications today. she has been encouraged to call the office with any questions or concerns that should arise related to todays visit.    No orders of the defined types were placed in this encounter.   Meds ordered this encounter  Medications   cyclobenzaprine (FLEXERIL) 5 MG tablet    Sig: Take 1 tablet (5 mg total) by mouth at bedtime.    Dispense:  30 tablet    Refill:  2    Fill new script today.    Return in about 3 months (around 05/26/2023) for F/U, [place] PCP.   Total time spent:30 Minutes Time spent includes review of chart, medications, test results, and follow up plan with the patient.   [place] Controlled Substance Database was reviewed by me.  This patient was seen by Sallyanne Kuster, FNP-C in collaboration with Dr. Beverely Risen as a part of collaborative care agreement.   Luismario Coston R. Tedd Sias, MSN, FNP-C Internal medicine

## 2023-03-07 ENCOUNTER — Other Ambulatory Visit: Payer: Self-pay | Admitting: Nurse Practitioner

## 2023-03-07 DIAGNOSIS — Z760 Encounter for issue of repeat prescription: Secondary | ICD-10-CM

## 2023-03-12 ENCOUNTER — Encounter: Payer: Self-pay | Admitting: Nurse Practitioner

## 2023-03-15 ENCOUNTER — Telehealth: Payer: Self-pay | Admitting: Nurse Practitioner

## 2023-03-15 NOTE — Telephone Encounter (Signed)
Neurology referral sent via [facility] to [facility]. Notified patient. Gave pt telephone # [phone]

## 2023-03-17 ENCOUNTER — Telehealth: Payer: Self-pay | Admitting: Nurse Practitioner

## 2023-03-17 NOTE — Telephone Encounter (Signed)
Neurology appointment 06/20/2023 @ [facility]

## 2023-04-04 ENCOUNTER — Other Ambulatory Visit: Payer: Self-pay | Admitting: Nurse Practitioner

## 2023-04-04 DIAGNOSIS — I700 Atherosclerosis of aorta: Secondary | ICD-10-CM

## 2023-04-11 ENCOUNTER — Encounter: Payer: Self-pay | Admitting: Nurse Practitioner

## 2023-04-18 ENCOUNTER — Other Ambulatory Visit: Payer: Self-pay | Admitting: Nurse Practitioner

## 2023-04-18 DIAGNOSIS — M150 Primary generalized (osteo)arthritis: Secondary | ICD-10-CM

## 2023-04-23 ENCOUNTER — Other Ambulatory Visit: Payer: Self-pay | Admitting: Nurse Practitioner

## 2023-04-23 DIAGNOSIS — N1831 Chronic kidney disease, stage 3a: Secondary | ICD-10-CM

## 2023-05-23 ENCOUNTER — Other Ambulatory Visit: Payer: Self-pay | Admitting: Nurse Practitioner

## 2023-05-23 DIAGNOSIS — M150 Primary generalized (osteo)arthritis: Secondary | ICD-10-CM

## 2023-05-26 ENCOUNTER — Encounter: Payer: Self-pay | Admitting: Nurse Practitioner

## 2023-05-26 ENCOUNTER — Ambulatory Visit (INDEPENDENT_AMBULATORY_CARE_PROVIDER_SITE_OTHER): Payer: 59 | Admitting: Nurse Practitioner

## 2023-05-26 VITALS — BP 108/70 | HR 96 | Temp 98.1°F | Resp 16 | Ht 63.0 in | Wt 154.0 lb

## 2023-05-26 DIAGNOSIS — J0100 Acute maxillary sinusitis, unspecified: Secondary | ICD-10-CM | POA: Diagnosis not present

## 2023-05-26 DIAGNOSIS — E1122 Type 2 diabetes mellitus with diabetic chronic kidney disease: Secondary | ICD-10-CM | POA: Diagnosis not present

## 2023-05-26 DIAGNOSIS — N1831 Chronic kidney disease, stage 3a: Secondary | ICD-10-CM

## 2023-05-26 DIAGNOSIS — H66001 Acute suppurative otitis media without spontaneous rupture of ear drum, right ear: Secondary | ICD-10-CM

## 2023-05-26 LAB — POCT GLYCOSYLATED HEMOGLOBIN (HGB A1C): Hemoglobin A1C: 6.1 % — AB (ref 4.0–5.6)

## 2023-05-26 MED ORDER — AMOXICILLIN-POT CLAVULANATE 875-125 MG PO TABS: 1.0000 | ORAL_TABLET | Freq: Two times a day (BID) | ORAL | 0 refills | Status: AC

## 2023-05-26 MED ORDER — OFLOXACIN 0.3 % OT SOLN: 5.0000 [drp] | Freq: Every day | OTIC | 0 refills | Status: AC

## 2023-05-26 NOTE — Progress Notes (Signed)
 [facility] [address] [place], [place] [address]  Internal MEDICINE  Office Visit Note  Patient Name: Stacey Boyd  [id number]  [id number]  Date of Service: 05/26/2023  Chief Complaint  Patient presents with   Depression   Diabetes   Hyperlipidemia   Hypertension   Follow-up    HPI [name] presents for a follow-up visit for diabetes, sinus infection and right ear infection  Diabetes -- A1c is stable, no change, A1c at 6.1  Possible sinus infection and ear infection -- symptoms started was 2 months, ear infection symptoms started a week ago. Reports nasal congestion, cough, headache, sinus pressure, runny nose, sneezing, sore throat, ear pain, fatigue, SOB. Right ear pain and tenderness that started about a week ago.     Current Medication: Outpatient Encounter Medications as of 05/26/2023  Medication Sig   acetaminophen  (TYLENOL ) 650 MG CR tablet Take 650-1,300 mg by mouth every 8 (eight) hours as needed for pain.   amoxicillin -clavulanate (AUGMENTIN ) 875-125 MG tablet Take 1 tablet by mouth 2 (two) times daily for 10 days. Take with food   aspirin  EC 81 MG tablet Take 1 tablet (81 mg total) by mouth daily. Swallow whole. Please get OTC.   atorvastatin  (LIPITOR) 10 MG tablet TAKE 1 TABLET BY MOUTH AT BEDTIME FOR CHOLESTEROL   Cholecalciferol  (VITAMIN D3) 5000 units TABS Take 5,000 Units by mouth daily.    colchicine  0.6 MG tablet ON FIRST DAY OF GOUT FLARE TAKE 2 TABLETS, THEN TAKE 1 ADIDTIONAL TABLET AN HOUR LATER. THEN TAKE 1 TABLET DAILY UNTIL FLARE RESOLVES.   cyclobenzaprine  (FLEXERIL ) 5 MG tablet TAKE ONE TABLET BY MOUTH AT BEDTIME   diclofenac  (VOLTAREN ) 75 MG EC tablet TAKE ONE TABLET BY MOUTH TWICE DAILY   dorzolamide -timolol  (COSOPT ) 22.3-6.8 MG/ML ophthalmic solution Place 1 drop into both eyes 2 (two) times daily.   Flaxseed, Linseed, (EQL FLAXSEED OIL) 1200 MG CAPS Take 1 capsule by mouth daily. Please get OTC   latanoprost  (XALATAN ) 0.005 %  ophthalmic solution Place 1 drop into both eyes at bedtime.   metFORMIN  (GLUCOPHAGE -XR) 500 MG 24 hr tablet TAKE 1 TABLET BY MOUTH ONCE DAILY WITH BREAKFAST   mirtazapine  (REMERON ) 15 MG tablet TAKE 1 TABLET BY MOUTH AT BEDTIME   ofloxacin  (FLOXIN ) 0.3 % OTIC solution Place 5 drops into the right ear daily for 5 days.   risperiDONE  (RISPERDAL  M-TABS) 2 MG disintegrating tablet DISSOLVE 1 TABLET UNDER THE TONGUE AT BEDTIME   sertraline  (ZOLOFT ) 100 MG tablet Take 1 tablet (100 mg total) by mouth daily.   traZODone  (DESYREL ) 50 MG tablet TAKE 1/2-1 TABLET BY MOUTH AT BEDTIME ASNEEDED FOR SLEEP   No facility-administered encounter medications on file as of 05/26/2023.    Surgical History: Past Surgical History:  Procedure Laterality Date   ABDOMINAL HYSTERECTOMY  2005   APPENDECTOMY  1980   CARDIAC CATHETERIZATION Left 09/17/2015   Procedure: Left Heart Cath and Coronary Angiography;  Surgeon: Vinie DELENA Jude, MD;  Location: [facility]VASIVE [facility];  Service: Cardiovascular;  Laterality: Left;   CARPAL TUNNEL RELEASE     CATARACT EXTRACTION W/PHACO Left 09/25/2019   Procedure: CATARACT EXTRACTION PHACO AND INTRAOCULAR LENS PLACEMENT (IOC) LEFT   4.45  00:36.1;  Surgeon: Jaye Fallow, MD;  Location: [facility];  Service: Ophthalmology;  Laterality: Left;   CATARACT EXTRACTION W/PHACO Right 10/16/2019   Procedure: CATARACT EXTRACTION PHACO AND INTRAOCULAR LENS PLACEMENT (IOC)right  DIABETIC;  Surgeon: Jaye Fallow, MD;  Location: [facility];  Service: Ophthalmology;  Laterality: Right;  5.29 0:33.7   COLONOSCOPY WITH PROPOFOL  N/A 11/07/2017   Procedure: COLONOSCOPY WITH PROPOFOL ;  Surgeon: Therisa Bi, MD;  Location: [facility];  Service: Gastroenterology;  Laterality: N/A;   FOOT SURGERY Left    2 nodules removed   FRACTURE SURGERY Right    wrist   WRIST FRACTURE SURGERY Right     Medical History: Past Medical History:  Diagnosis Date   Allergy    Anxiety     Arthritis    legs   Cancer (HCC)    skin cancer on right shoulder.    Depression    Diabetes mellitus without complication (HCC)    Diverticulitis    Dyspnea    with exertion   Glaucoma    Hyperlipidemia    Hypertension    controlled on meds   Hypothyroidism    Liver cyst    Osteoporosis    Shoulder pain, left    and arm   Thyroid  disease    Wrist fracture 2001   right    Family History: Family History  Problem Relation Age of Onset   Heart disease Mother        h/o rheumatic fever   Heart disease Father    Stroke Father    Diabetes Sister    Diabetes Brother    Diabetes Sister    Mental illness Other    Colon cancer Neg Hx    Breast cancer Neg Hx     Social History   Socioeconomic History   Marital status: Divorced    Spouse name: Not on file   Number of children: 3   Years of education: Not on file   Highest education level: 10th grade  Occupational History   Not on file  Tobacco Use   Smoking status: Former    Current packs/day: 0.00    Average packs/day: 0.3 packs/day for 10.0 years (2.5 ttl pk-yrs)    Types: Cigarettes    Start date: 09/16/1969    Quit date: 09/17/1979    Years since quitting: 43.7   Smokeless tobacco: Never  Vaping Use   Vaping status: Never Used  Substance and Sexual Activity   Alcohol use: No   Drug use: No   Sexual activity: Yes    Partners: Male    Birth control/protection: None  Other Topics Concern   Not on file  Social History Narrative   3 kids, local   Lives with daughter as of 2016   Divorced ~2002   Social Drivers of Health   Financial Resource Strain: Low Risk  (12/29/2017)   Overall Financial Resource Strain (CARDIA)    Difficulty of Paying Living Expenses: Not hard at all  Food Insecurity: No Food Insecurity (12/29/2017)   Hunger Vital Sign    Worried About Running Out of Food in the Last Year: Never true    Ran Out of Food in the Last Year: Never true  Transportation Needs: No Transportation Needs  (12/29/2017)   PRAPARE - Administrator, Civil Service (Medical): No    Lack of Transportation (Non-Medical): No  Physical Activity: Inactive (12/29/2017)   Exercise Vital Sign    Days of Exercise per Week: 0 days    Minutes of Exercise per Session: 0 min  Stress: No Stress Concern Present (12/29/2017)   [employer] of Occupational Health - Occupational Stress Questionnaire    Feeling of Stress : Not at all  Social Connections: Unknown (12/29/2017)   Social  Connection and Isolation Panel [NHANES]    Frequency of Communication with Friends and Family: Not on file    Frequency of Social Gatherings with Friends and Family: Not on file    Attends Religious Services: 1 to 4 times per year    Active Member of [employer] or Organizations: No    Attends Banker Meetings: Never    Marital Status: Divorced  Catering Manager Violence: Not At Risk (12/29/2017)   Humiliation, Afraid, Rape, and Kick questionnaire    Fear of Current or Ex-Partner: No    Emotionally Abused: No    Physically Abused: No    Sexually Abused: No      Review of Systems  Constitutional:  Positive for appetite change, chills and fatigue. Negative for fever.  HENT:  Positive for congestion, ear pain, postnasal drip, rhinorrhea, sinus pressure, sinus pain, sneezing and voice change. Negative for sore throat.   Respiratory:  Positive for cough and shortness of breath. Negative for chest tightness and wheezing.   Cardiovascular: Negative.  Negative for chest pain and palpitations.  Neurological:  Positive for headaches.  Psychiatric/Behavioral:  Positive for depression.     Vital Signs: BP 108/70   Pulse 96   Temp 98.1 F (36.7 C)   Resp 16   Ht 5' 3 (1.6 m)   Wt 154 lb (69.9 kg)   SpO2 99%   BMI 27.28 kg/m    Physical Exam Vitals reviewed.  Constitutional:      General: She is not in acute distress.    Appearance: Normal appearance. She is ill-appearing.  HENT:     Head:  Normocephalic and atraumatic.     Right Ear: Drainage, swelling and tenderness present. A middle ear effusion is present. Tympanic membrane is erythematous.     Left Ear: Tympanic membrane, ear canal and external ear normal.     Nose: Mucosal edema, congestion and rhinorrhea present.     Right Turbinates: Swollen and pale.     Left Turbinates: Swollen and pale.     Right Sinus: Maxillary sinus tenderness and frontal sinus tenderness present.     Left Sinus: Maxillary sinus tenderness and frontal sinus tenderness present.     Mouth/Throat:     Lips: Pink.     Mouth: Mucous membranes are moist.     Pharynx: Posterior oropharyngeal erythema present.  Eyes:     Pupils: Pupils are equal, round, and reactive to light.  Cardiovascular:     Rate and Rhythm: Normal rate and regular rhythm.     Heart sounds: Normal heart sounds. No murmur heard.    No gallop.  Pulmonary:     Effort: Pulmonary effort is normal. No respiratory distress.     Breath sounds: Normal breath sounds. No wheezing.  Lymphadenopathy:     Head:     Right side of head: Posterior auricular adenopathy present.     Cervical: Cervical adenopathy present.     Right cervical: Superficial cervical adenopathy present.  Neurological:     Mental Status: She is alert and oriented to person, place, and time.  Psychiatric:        Mood and Affect: Mood normal.        Behavior: Behavior normal.        Assessment/Plan: 1. Type 2 diabetes mellitus with stage 3a chronic kidney disease, without long-term current use of insulin (HCC) (Primary) A1c is stable, continue medication as prescribed. Follow up in 4 months  - POCT glycosylated hemoglobin (Hb A1C)  2. Acute non-recurrent maxillary sinusitis Augmentin  prescribed, take until gone  - amoxicillin -clavulanate (AUGMENTIN ) 875-125 MG tablet; Take 1 tablet by mouth 2 (two) times daily for 10 days. Take with food  Dispense: 20 tablet; Refill: 0  3. Non-recurrent acute suppurative  otitis media of right ear without spontaneous rupture of tympanic membrane Ofloxacin  prescribed.  - ofloxacin  (FLOXIN ) 0.3 % OTIC solution; Place 5 drops into the right ear daily for 5 days.  Dispense: 5 mL; Refill: 0   General Counseling: Lakara verbalizes understanding of the findings of todays visit and agrees with plan of treatment. I have discussed any further diagnostic evaluation that may be needed or ordered today. We also reviewed her medications today. she has been encouraged to call the office with any questions or concerns that should arise related to todays visit.    Orders Placed This Encounter  Procedures   POCT glycosylated hemoglobin (Hb A1C)    Meds ordered this encounter  Medications   amoxicillin -clavulanate (AUGMENTIN ) 875-125 MG tablet    Sig: Take 1 tablet by mouth 2 (two) times daily for 10 days. Take with food    Dispense:  20 tablet    Refill:  0    Fill new script today   ofloxacin  (FLOXIN ) 0.3 % OTIC solution    Sig: Place 5 drops into the right ear daily for 5 days.    Dispense:  5 mL    Refill:  0    Fill new script today    Return in about 4 months (around 09/23/2023) for F/U, Recheck A1C, Terianne Thaker PCP.   Total time spent:30 Minutes Time spent includes review of chart, medications, test results, and follow up plan with the patient.   [place] Controlled Substance Database was reviewed by me.  This patient was seen by Mardy Maxin, FNP-C in collaboration with Dr. Sigrid Bathe as a part of collaborative care agreement.   Damira Kem R. Maxin, MSN, FNP-C Internal medicine

## 2023-05-27 ENCOUNTER — Encounter: Payer: Self-pay | Admitting: Nurse Practitioner

## 2023-07-01 ENCOUNTER — Other Ambulatory Visit: Payer: Self-pay | Admitting: Nurse Practitioner

## 2023-07-01 DIAGNOSIS — I700 Atherosclerosis of aorta: Secondary | ICD-10-CM

## 2023-07-14 ENCOUNTER — Telehealth: Payer: Self-pay

## 2023-07-14 NOTE — Telephone Encounter (Signed)
Error

## 2023-07-20 ENCOUNTER — Other Ambulatory Visit: Payer: Self-pay | Admitting: Nurse Practitioner

## 2023-07-20 DIAGNOSIS — M150 Primary generalized (osteo)arthritis: Secondary | ICD-10-CM

## 2023-07-28 DIAGNOSIS — H401132 Primary open-angle glaucoma, bilateral, moderate stage: Secondary | ICD-10-CM | POA: Diagnosis not present

## 2023-07-28 DIAGNOSIS — M3501 Sicca syndrome with keratoconjunctivitis: Secondary | ICD-10-CM | POA: Diagnosis not present

## 2023-07-28 DIAGNOSIS — H26491 Other secondary cataract, right eye: Secondary | ICD-10-CM | POA: Diagnosis not present

## 2023-07-28 DIAGNOSIS — E119 Type 2 diabetes mellitus without complications: Secondary | ICD-10-CM | POA: Diagnosis not present

## 2023-08-23 ENCOUNTER — Ambulatory Visit: Payer: 59 | Admitting: Nurse Practitioner

## 2023-08-25 ENCOUNTER — Ambulatory Visit: Admitting: Nurse Practitioner

## 2023-08-25 ENCOUNTER — Encounter: Payer: Self-pay | Admitting: Nurse Practitioner

## 2023-08-25 VITALS — BP 110/72 | HR 86 | Temp 98.1°F | Resp 16 | Ht 63.0 in | Wt 153.2 lb

## 2023-08-25 DIAGNOSIS — E1122 Type 2 diabetes mellitus with diabetic chronic kidney disease: Secondary | ICD-10-CM

## 2023-08-25 DIAGNOSIS — I700 Atherosclerosis of aorta: Secondary | ICD-10-CM | POA: Diagnosis not present

## 2023-08-25 DIAGNOSIS — N1831 Chronic kidney disease, stage 3a: Secondary | ICD-10-CM

## 2023-08-25 DIAGNOSIS — Z0000 Encounter for general adult medical examination without abnormal findings: Secondary | ICD-10-CM

## 2023-08-25 DIAGNOSIS — E039 Hypothyroidism, unspecified: Secondary | ICD-10-CM | POA: Diagnosis not present

## 2023-08-25 DIAGNOSIS — Z79899 Other long term (current) drug therapy: Secondary | ICD-10-CM | POA: Diagnosis not present

## 2023-08-25 DIAGNOSIS — J301 Allergic rhinitis due to pollen: Secondary | ICD-10-CM | POA: Diagnosis not present

## 2023-08-25 DIAGNOSIS — G2401 Drug induced subacute dyskinesia: Secondary | ICD-10-CM

## 2023-08-25 DIAGNOSIS — F5104 Psychophysiologic insomnia: Secondary | ICD-10-CM

## 2023-08-25 LAB — POCT GLYCOSYLATED HEMOGLOBIN (HGB A1C): Hemoglobin A1C: 6.1 % — AB (ref 4.0–5.6)

## 2023-08-25 MED ORDER — METFORMIN HCL ER 500 MG PO TB24: 500.0000 mg | ORAL_TABLET | Freq: Every day | ORAL | 1 refills | Status: DC

## 2023-08-25 MED ORDER — RISPERIDONE 2 MG PO TBDP: 2.0000 mg | ORAL_TABLET | Freq: Every day | ORAL | 3 refills | Status: AC

## 2023-08-25 MED ORDER — LEVOCETIRIZINE DIHYDROCHLORIDE 5 MG PO TABS: 5.0000 mg | ORAL_TABLET | Freq: Every evening | ORAL | 5 refills | Status: DC

## 2023-08-25 MED ORDER — TRAZODONE HCL 100 MG PO TABS: 100.0000 mg | ORAL_TABLET | Freq: Every day | ORAL | 5 refills | Status: DC

## 2023-08-25 MED ORDER — SERTRALINE HCL 100 MG PO TABS: 100.0000 mg | ORAL_TABLET | Freq: Every day | ORAL | 3 refills | Status: AC

## 2023-08-25 NOTE — Progress Notes (Signed)
 [facility] [address] [place], [place] [address]  Internal MEDICINE  Office Visit Note  Patient Name: Stacey Boyd  [id number]  [id number]  Date of Service: 08/25/2023  Chief Complaint  Patient presents with   Depression   Diabetes   Hypertension   Hyperlipidemia    HPI Stacey Boyd presents for an annual well visit and physical exam.  Well-appearing 77 y.o. female with hypertension, diabetes, hypothyroidism, osteopenia, and delusional disorder and a history of auditory hallucinations  Routine CRC screening: discontinued  Routine mammogram: discontinued  DEXA scan: done in 2016 Eye exam: goes to [facility] foot exam: done  Labs: due for routine labs  New or worsening pain: none  Other concerns: having itchy watery eyes related to her environmental allergies bothering her.      08/25/2023    3:16 PM 08/23/2022    1:53 PM 06/26/2021    9:37 AM  MMSE - Mini Mental State Exam  Orientation to time 5 5 5   Orientation to Place 5 5 5   Registration 3 3 3   Attention/ Calculation 5 5 5   Recall 3 3 3   Language- name 2 objects 2 2 2   Language- repeat 1 1 1   Language- follow 3 step command 3 3 3   Language- read & follow direction 1 1 1   Write a sentence 0 0 1  Copy design 1 1 1   Total score 29 29 30     Functional Status Survey: Is the patient deaf or have difficulty hearing?: Yes Does the patient have difficulty seeing, even when wearing glasses/contacts?: Yes Does the patient have difficulty concentrating, remembering, or making decisions?: Yes Does the patient have difficulty walking or climbing stairs?: Yes Does the patient have difficulty dressing or bathing?: No Does the patient have difficulty doing errands alone such as visiting a doctor's office or shopping?: Yes     12/25/2021   10:31 AM 04/02/2022   10:28 AM 07/23/2022   10:24 AM 08/23/2022    1:51 PM 08/25/2023    3:14 PM  Fall Risk  Falls in the past year? 0 0 0 0 0  Was there an injury with  Fall?  0 0 0 0  Fall Risk Category Calculator  0 0 0 0  Fall Risk Category (Retired)  Low     (RETIRED) Patient Fall Risk Level  Low fall risk     Patient at Risk for Falls Due to  No Fall Risks No Fall Risks No Fall Risks No Fall Risks  Fall risk Follow up  Falls evaluation completed Falls evaluation completed Falls evaluation completed Falls evaluation completed       08/26/2023    7:57 AM  Depression screen PHQ 2/9  Decreased Interest 0  Down, Depressed, Hopeless 0  PHQ - 2 Score 0       Current Medication: Outpatient Encounter Medications as of 08/25/2023  Medication Sig   acetaminophen (TYLENOL) 650 MG CR tablet Take 650-1,300 mg by mouth every 8 (eight) hours as needed for pain.   aspirin EC 81 MG tablet Take 1 tablet (81 mg total) by mouth daily. Swallow whole. Please get OTC.   atorvastatin (LIPITOR) 10 MG tablet TAKE 1 TABLET BY MOUTH AT BEDTIME FOR CHOLESTEROL   Cholecalciferol (VITAMIN D3) 5000 units TABS Take 5,000 Units by mouth daily.    colchicine 0.6 MG tablet ON FIRST DAY OF GOUT FLARE TAKE 2 TABLETS, THEN TAKE 1 ADIDTIONAL TABLET AN HOUR LATER. THEN TAKE 1 TABLET DAILY UNTIL  FLARE RESOLVES.   cyclobenzaprine (FLEXERIL) 5 MG tablet TAKE ONE TABLET BY MOUTH AT BEDTIME   diclofenac (VOLTAREN) 75 MG EC tablet TAKE ONE TABLET BY MOUTH TWICE DAILY   dorzolamide-timolol (COSOPT) 22.3-6.8 MG/ML ophthalmic solution Place 1 drop into both eyes 2 (two) times daily.   Flaxseed, Linseed, (EQL FLAXSEED OIL) 1200 MG CAPS Take 1 capsule by mouth daily. Please get OTC   latanoprost (XALATAN) 0.005 % ophthalmic solution Place 1 drop into both eyes at bedtime.   levocetirizine (XYZAL) 5 MG tablet Take 1 tablet (5 mg total) by mouth every evening.   mirtazapine (REMERON) 15 MG tablet TAKE 1 TABLET BY MOUTH AT BEDTIME   traZODone (DESYREL) 100 MG tablet Take 1-2 tablets (100-200 mg total) by mouth at bedtime.   [DISCONTINUED] metFORMIN (GLUCOPHAGE-XR) 500 MG 24 hr tablet TAKE 1 TABLET  BY MOUTH ONCE DAILY WITH BREAKFAST   [DISCONTINUED] risperiDONE (RISPERDAL M-TABS) 2 MG disintegrating tablet DISSOLVE 1 TABLET UNDER THE TONGUE AT BEDTIME   [DISCONTINUED] sertraline (ZOLOFT) 100 MG tablet Take 1 tablet (100 mg total) by mouth daily.   [DISCONTINUED] traZODone (DESYREL) 50 MG tablet TAKE 1/2-1 TABLET BY MOUTH AT BEDTIME ASNEEDED FOR SLEEP   metFORMIN (GLUCOPHAGE-XR) 500 MG 24 hr tablet Take 1 tablet (500 mg total) by mouth daily with breakfast.   risperiDONE (RISPERDAL M-TABS) 2 MG disintegrating tablet Take 1 tablet (2 mg total) by mouth at bedtime.   sertraline (ZOLOFT) 100 MG tablet Take 1 tablet (100 mg total) by mouth daily.   No facility-administered encounter medications on file as of 08/25/2023.    Surgical History: Past Surgical History:  Procedure Laterality Date   ABDOMINAL HYSTERECTOMY  2005   APPENDECTOMY  1980   CARDIAC CATHETERIZATION Left 09/17/2015   Procedure: Left Heart Cath and Coronary Angiography;  Surgeon: Ronney Cola, MD;  Location: [facility] INVASIVE CV LAB;  Service: Cardiovascular;  Laterality: Left;   CARPAL TUNNEL RELEASE     CATARACT EXTRACTION W/PHACO Left 09/25/2019   Procedure: CATARACT EXTRACTION PHACO AND INTRAOCULAR LENS PLACEMENT (IOC) LEFT   4.45  00:36.1;  Surgeon: Clair Crews, MD;  Location: [facility];  Service: Ophthalmology;  Laterality: Left;   CATARACT EXTRACTION W/PHACO Right 10/16/2019   Procedure: CATARACT EXTRACTION PHACO AND INTRAOCULAR LENS PLACEMENT (IOC)right  DIABETIC;  Surgeon: Clair Crews, MD;  Location: [facility];  Service: Ophthalmology;  Laterality: Right;  5.29 0:33.7   COLONOSCOPY WITH PROPOFOL N/A 11/07/2017   Procedure: COLONOSCOPY WITH PROPOFOL;  Surgeon: Luke Salaam, MD;  Location: [facility] ENDOSCOPY;  Service: Gastroenterology;  Laterality: N/A;   FOOT SURGERY Left    2 nodules removed   FRACTURE SURGERY Right    wrist   WRIST FRACTURE SURGERY Right     Medical History: Past Medical  History:  Diagnosis Date   Allergy    Anxiety    Arthritis    legs   Cancer (HCC)    skin cancer on right shoulder.    Depression    Diabetes mellitus without complication (HCC)    Diverticulitis    Dyspnea    with exertion   Glaucoma    Hyperlipidemia    Hypertension    controlled on meds   Hypothyroidism    Liver cyst    Osteoporosis    Shoulder pain, left    and arm   Thyroid disease    Wrist fracture 2001   right    Family History: Family History  Problem Relation Age of Onset  Heart disease Mother        h/o rheumatic fever   Heart disease Father    Stroke Father    Diabetes Sister    Diabetes Brother    Diabetes Sister    Mental illness Other    Colon cancer Neg Hx    Breast cancer Neg Hx     Social History   Socioeconomic History   Marital status: Divorced    Spouse name: Not on file   Number of children: 3   Years of education: Not on file   Highest education level: 10th grade  Occupational History   Not on file  Tobacco Use   Smoking status: Former    Current packs/day: 0.00    Average packs/day: 0.3 packs/day for 10.0 years (2.5 ttl pk-yrs)    Types: Cigarettes    Start date: 09/16/1969    Quit date: 09/17/1979    Years since quitting: 43.9   Smokeless tobacco: Never  Vaping Use   Vaping status: Never Used  Substance and Sexual Activity   Alcohol use: No   Drug use: No   Sexual activity: Yes    Partners: Male    Birth control/protection: None  Other Topics Concern   Not on file  Social History Narrative   3 kids, local   Lives with daughter as of 2016   Divorced ~2002   Social Drivers of Health   Financial Resource Strain: Low Risk  (12/29/2017)   Overall Financial Resource Strain (CARDIA)    Difficulty of Paying Living Expenses: Not hard at all  Food Insecurity: No Food Insecurity (12/29/2017)   Hunger Vital Sign    Worried About Running Out of Food in the Last Year: Never true    Ran Out of Food in the Last Year: Never true   Transportation Needs: No Transportation Needs (12/29/2017)   PRAPARE - Administrator, Civil Service (Medical): No    Lack of Transportation (Non-Medical): No  Physical Activity: Inactive (12/29/2017)   Exercise Vital Sign    Days of Exercise per Week: 0 days    Minutes of Exercise per Session: 0 min  Stress: No Stress Concern Present (12/29/2017)   [employer] of Occupational Health - Occupational Stress Questionnaire    Feeling of Stress : Not at all  Social Connections: Unknown (12/29/2017)   Social Connection and Isolation Panel [NHANES]    Frequency of Communication with Friends and Family: Not on file    Frequency of Social Gatherings with Friends and Family: Not on file    Attends Religious Services: 1 to 4 times per year    Active Member of [employer] or Organizations: No    Attends Banker Meetings: Never    Marital Status: Divorced  Catering manager Violence: Not At Risk (12/29/2017)   Humiliation, Afraid, Rape, and Kick questionnaire    Fear of Current or Ex-Partner: No    Emotionally Abused: No    Physically Abused: No    Sexually Abused: No      Review of Systems  Constitutional:  Positive for fatigue. Negative for activity change, appetite change, chills, fever and unexpected weight change.  HENT: Negative.  Negative for congestion, ear pain, rhinorrhea, sneezing, sore throat and trouble swallowing.   Eyes:  Positive for itching (itchy watery eyes) and visual disturbance. Negative for redness.  Respiratory: Negative.  Negative for cough, chest tightness, shortness of breath and wheezing.   Cardiovascular: Negative.  Negative for chest pain and palpitations.  Gastrointestinal: Negative.  Negative for abdominal pain, blood in stool, constipation, diarrhea, nausea and vomiting.  Endocrine: Negative.   Genitourinary: Negative.  Negative for difficulty urinating, dysuria, frequency, hematuria and urgency.  Musculoskeletal:  Positive for  arthralgias. Negative for back pain, joint swelling, myalgias and neck pain.  Skin: Negative.  Negative for rash and wound.  Allergic/Immunologic: Negative.  Negative for immunocompromised state.  Neurological: Negative.  Negative for dizziness, tremors, seizures, numbness and headaches.  Hematological: Negative.  Negative for adenopathy. Does not bruise/bleed easily.  Psychiatric/Behavioral:  Positive for depression and sleep disturbance. Negative for behavioral problems (Depression), self-injury and suicidal ideas. The patient is nervous/anxious.     Vital Signs: BP 110/72   Pulse 86   Temp 98.1 F (36.7 C)   Resp 16   Ht 5\' 3"  (1.6 m)   Wt 153 lb 3.2 oz (69.5 kg)   SpO2 98%   BMI 27.14 kg/m    Physical Exam Vitals reviewed.  Constitutional:      General: She is not in acute distress.    Appearance: Normal appearance. She is well-developed and normal weight. She is not ill-appearing or diaphoretic.  HENT:     Head: Normocephalic and atraumatic.     Right Ear: Tympanic membrane, ear canal and external ear normal.     Left Ear: Tympanic membrane, ear canal and external ear normal.     Nose: Nose normal. No congestion or rhinorrhea.     Mouth/Throat:     Mouth: Mucous membranes are moist.     Pharynx: Oropharynx is clear. No oropharyngeal exudate or posterior oropharyngeal erythema.  Eyes:     General: No scleral icterus.       Right eye: No discharge.        Left eye: No discharge.     Extraocular Movements: Extraocular movements intact.     Conjunctiva/sclera: Conjunctivae normal.     Pupils: Pupils are equal, round, and reactive to light.  Neck:     Thyroid: No thyromegaly.     Vascular: No JVD.     Trachea: No tracheal deviation.  Cardiovascular:     Rate and Rhythm: Normal rate and regular rhythm.     Pulses:          Dorsalis pedis pulses are 2+ on the right side and 2+ on the left side.       Posterior tibial pulses are 2+ on the right side and 2+ on the left  side.     Heart sounds: Normal heart sounds. No murmur heard.    No friction rub. No gallop.  Pulmonary:     Effort: Pulmonary effort is normal. No respiratory distress.     Breath sounds: Normal breath sounds. No stridor. No wheezing or rales.  Chest:     Chest wall: No tenderness.  Abdominal:     General: Bowel sounds are normal. There is no distension.     Palpations: Abdomen is soft. There is no mass.     Tenderness: There is no abdominal tenderness. There is no guarding or rebound.  Musculoskeletal:        General: No tenderness or deformity. Normal range of motion.     Cervical back: Normal range of motion and neck supple.     Right foot: Normal range of motion. No deformity, bunion, Charcot foot, foot drop or prominent metatarsal heads.     Left foot: Normal range of motion. No deformity, bunion, Charcot foot, foot drop or prominent metatarsal heads.  Feet:     Right foot:     Protective Sensation: 6 sites tested.  6 sites sensed.     Skin integrity: Dry skin present. No ulcer, blister, skin breakdown, erythema, warmth, callus or fissure.     Toenail Condition: Right toenails are normal.     Left foot:     Protective Sensation: 6 sites tested.  6 sites sensed.     Skin integrity: Dry skin present. No ulcer, blister, skin breakdown, erythema, warmth, callus or fissure.     Toenail Condition: Left toenails are normal.  Lymphadenopathy:     Cervical: No cervical adenopathy.  Skin:    General: Skin is warm and dry.     Capillary Refill: Capillary refill takes less than 2 seconds.     Coloration: Skin is not pale.     Findings: No erythema or rash.  Neurological:     Mental Status: She is alert and oriented to person, place, and time.     Cranial Nerves: No cranial nerve deficit.     Motor: No abnormal muscle tone.     Coordination: Coordination normal.     Gait: Gait abnormal.     Deep Tendon Reflexes: Reflexes are normal and symmetric.  Psychiatric:        Attention and  Perception: Perception normal. She does not perceive auditory or visual hallucinations.        Mood and Affect: Mood and affect normal.        Behavior: Behavior normal. Behavior is cooperative.        Thought Content: Thought content normal. Thought content is not paranoid or delusional.        Judgment: Judgment normal.        Assessment/Plan: 1. Encounter for subsequent annual wellness visit (AWV) in Medicare patient (Primary) Age-appropriate preventive screenings and vaccinations discussed. Routine labs for health maintenance will be ordered. PHM updated.    2. Type 2 diabetes mellitus with stage 3a chronic kidney disease, without long-term current use of insulin (HCC) A1c is stable, no change. Continue metformin as prescribed.  - POCT glycosylated hemoglobin (Hb A1C) - metFORMIN (GLUCOPHAGE-XR) 500 MG 24 hr tablet; Take 1 tablet (500 mg total) by mouth daily with breakfast.  Dispense: 90 tablet; Refill: 1  3. Aortic atherosclerosis (HCC) Continue atorvastatin as prescribed.   4. Seasonal allergic rhinitis due to pollen Switch medication to xyzal and take as prescribed.  - levocetirizine (XYZAL) 5 MG tablet; Take 1 tablet (5 mg total) by mouth every evening.  Dispense: 30 tablet; Refill: 5  5. Tardive dyskinesia Not on any medication for this. It is noticeable but does not seem to bother the patient  6. Acquired hypothyroidism Continue to monitor levels, not currently on any medication   7. Psychophysiological insomnia Continue trazodone as prescribed.  - traZODone (DESYREL) 100 MG tablet; Take 1-2 tablets (100-200 mg total) by mouth at bedtime.  Dispense: 60 tablet; Refill: 5  8. Encounter for medication review Medication list reviewed, updated, and refills ordered  - sertraline (ZOLOFT) 100 MG tablet; Take 1 tablet (100 mg total) by mouth daily.  Dispense: 90 tablet; Refill: 3 - risperiDONE (RISPERDAL M-TABS) 2 MG disintegrating tablet; Take 1 tablet (2 mg total) by  mouth at bedtime.  Dispense: 90 tablet; Refill: 3      General Counseling: Stacey Boyd verbalizes understanding of the findings of todays visit and agrees with plan of treatment. I have discussed any further diagnostic evaluation that may be needed or ordered today.  We also reviewed her medications today. she has been encouraged to call the office with any questions or concerns that should arise related to todays visit.    Orders Placed This Encounter  Procedures   POCT glycosylated hemoglobin (Hb A1C)    Meds ordered this encounter  Medications   levocetirizine (XYZAL) 5 MG tablet    Sig: Take 1 tablet (5 mg total) by mouth every evening.    Dispense:  30 tablet    Refill:  5    Fill new script today   sertraline (ZOLOFT) 100 MG tablet    Sig: Take 1 tablet (100 mg total) by mouth daily.    Dispense:  90 tablet    Refill:  3    For future refills, please fill 90 day supply   risperiDONE (RISPERDAL M-TABS) 2 MG disintegrating tablet    Sig: Take 1 tablet (2 mg total) by mouth at bedtime.    Dispense:  90 tablet    Refill:  3    PT NEEDS A REFILL   metFORMIN (GLUCOPHAGE-XR) 500 MG 24 hr tablet    Sig: Take 1 tablet (500 mg total) by mouth daily with breakfast.    Dispense:  90 tablet    Refill:  1   traZODone (DESYREL) 100 MG tablet    Sig: Take 1-2 tablets (100-200 mg total) by mouth at bedtime.    Dispense:  60 tablet    Refill:  5    Note increased dose, discontinue 50 mg tablet and fill new script today.    Return in about 1 month (around 09/24/2023) for F/U, Labs, Erica Osuna PCP.   Total time spent:30 Minutes Time spent includes review of chart, medications, test results, and follow up plan with the patient.   [place] Controlled Substance Database was reviewed by me.  This patient was seen by Laurence Pons, FNP-C in collaboration with Dr. Verneta Gone as a part of collaborative care agreement.  Samie Reasons R. Bobbi Burow, MSN, FNP-C Internal medicine

## 2023-08-26 ENCOUNTER — Encounter: Payer: Self-pay | Admitting: Nurse Practitioner

## 2023-09-10 ENCOUNTER — Other Ambulatory Visit: Payer: Self-pay | Admitting: Nurse Practitioner

## 2023-09-10 DIAGNOSIS — M150 Primary generalized (osteo)arthritis: Secondary | ICD-10-CM

## 2023-09-13 ENCOUNTER — Other Ambulatory Visit: Payer: Self-pay | Admitting: Nurse Practitioner

## 2023-09-13 DIAGNOSIS — E039 Hypothyroidism, unspecified: Secondary | ICD-10-CM | POA: Diagnosis not present

## 2023-09-13 DIAGNOSIS — E559 Vitamin D deficiency, unspecified: Secondary | ICD-10-CM | POA: Diagnosis not present

## 2023-09-13 DIAGNOSIS — E782 Mixed hyperlipidemia: Secondary | ICD-10-CM | POA: Diagnosis not present

## 2023-09-13 DIAGNOSIS — I7090 Unspecified atherosclerosis: Secondary | ICD-10-CM | POA: Diagnosis not present

## 2023-09-13 DIAGNOSIS — G2401 Drug induced subacute dyskinesia: Secondary | ICD-10-CM | POA: Diagnosis not present

## 2023-09-13 DIAGNOSIS — E1129 Type 2 diabetes mellitus with other diabetic kidney complication: Secondary | ICD-10-CM | POA: Diagnosis not present

## 2023-09-14 LAB — COMPREHENSIVE METABOLIC PANEL WITH GFR
ALT: 17 IU/L (ref 0–32)
AST: 20 IU/L (ref 0–40)
Albumin: 4.4 g/dL (ref 3.8–4.8)
Alkaline Phosphatase: 68 IU/L (ref 44–121)
BUN/Creatinine Ratio: 22 (ref 12–28)
BUN: 20 mg/dL (ref 8–27)
Bilirubin Total: 0.3 mg/dL (ref 0.0–1.2)
CO2: 24 mmol/L (ref 20–29)
Calcium: 9.3 mg/dL (ref 8.7–10.3)
Chloride: 105 mmol/L (ref 96–106)
Creatinine, Ser: 0.92 mg/dL (ref 0.57–1.00)
Globulin, Total: 2.2 g/dL (ref 1.5–4.5)
Glucose: 128 mg/dL — ABNORMAL HIGH (ref 70–99)
Potassium: 4.2 mmol/L (ref 3.5–5.2)
Sodium: 145 mmol/L — ABNORMAL HIGH (ref 134–144)
Total Protein: 6.6 g/dL (ref 6.0–8.5)
eGFR: 63 mL/min/{1.73_m2} (ref >59)

## 2023-09-14 LAB — LIPID PANEL
Chol/HDL Ratio: 4.6 ratio — ABNORMAL HIGH (ref 0.0–4.4)
Cholesterol, Total: 185 mg/dL (ref 100–199)
HDL: 40 mg/dL (ref >39)
LDL Chol Calc (NIH): 115 mg/dL — ABNORMAL HIGH (ref 0–99)
Triglycerides: 168 mg/dL — ABNORMAL HIGH (ref 0–149)
VLDL Cholesterol Cal: 30 mg/dL (ref 5–40)

## 2023-09-14 LAB — CBC WITH DIFFERENTIAL/PLATELET
Basophils Absolute: 0.1 10*3/uL (ref 0.0–0.2)
Basos: 1 %
EOS (ABSOLUTE): 0.1 10*3/uL (ref 0.0–0.4)
Eos: 1 %
Hematocrit: 42.3 % (ref 34.0–46.6)
Hemoglobin: 14 g/dL (ref 11.1–15.9)
Immature Grans (Abs): 0 10*3/uL (ref 0.0–0.1)
Immature Granulocytes: 0 %
Lymphocytes Absolute: 2.3 10*3/uL (ref 0.7–3.1)
Lymphs: 34 %
MCH: 30.9 pg (ref 26.6–33.0)
MCHC: 33.1 g/dL (ref 31.5–35.7)
MCV: 93 fL (ref 79–97)
Monocytes Absolute: 0.4 10*3/uL (ref 0.1–0.9)
Monocytes: 6 %
Neutrophils Absolute: 3.9 10*3/uL (ref 1.4–7.0)
Neutrophils: 58 %
Platelets: 276 10*3/uL (ref 150–450)
RBC: 4.53 x10E6/uL (ref 3.77–5.28)
RDW: 12.9 % (ref 11.7–15.4)
WBC: 6.8 10*3/uL (ref 3.4–10.8)

## 2023-09-14 LAB — B12 AND FOLATE PANEL
Folate: 14 ng/mL (ref >3.0)
Vitamin B-12: 539 pg/mL (ref 232–1245)

## 2023-09-14 LAB — T4, FREE: Free T4: 1.39 ng/dL (ref 0.82–1.77)

## 2023-09-14 LAB — TSH: TSH: 4.71 u[IU]/mL — ABNORMAL HIGH (ref 0.450–4.500)

## 2023-09-14 LAB — VITAMIN D 25 HYDROXY (VIT D DEFICIENCY, FRACTURES): Vit D, 25-Hydroxy: 51.3 ng/mL (ref 30.0–100.0)

## 2023-09-22 ENCOUNTER — Encounter: Payer: Self-pay | Admitting: Nurse Practitioner

## 2023-09-22 ENCOUNTER — Ambulatory Visit: Payer: 59 | Admitting: Nurse Practitioner

## 2023-09-22 VITALS — BP 130/70 | HR 71 | Temp 98.4°F | Resp 16 | Ht 63.0 in | Wt 154.0 lb

## 2023-09-22 DIAGNOSIS — I700 Atherosclerosis of aorta: Secondary | ICD-10-CM

## 2023-09-22 DIAGNOSIS — E1122 Type 2 diabetes mellitus with diabetic chronic kidney disease: Secondary | ICD-10-CM

## 2023-09-22 DIAGNOSIS — N1831 Chronic kidney disease, stage 3a: Secondary | ICD-10-CM | POA: Diagnosis not present

## 2023-09-22 DIAGNOSIS — R946 Abnormal results of thyroid function studies: Secondary | ICD-10-CM

## 2023-09-22 DIAGNOSIS — R944 Abnormal results of kidney function studies: Secondary | ICD-10-CM

## 2023-09-22 LAB — POCT GLYCOSYLATED HEMOGLOBIN (HGB A1C): Hemoglobin A1C: 6 % — AB (ref 4.0–5.6)

## 2023-09-22 MED ORDER — ATORVASTATIN CALCIUM 10 MG PO TABS: ORAL_TABLET | ORAL | 1 refills | Status: DC

## 2023-09-22 NOTE — Progress Notes (Signed)
 [facility] [address] [place], [place] [address]  Internal MEDICINE  Office Visit Note  Patient Name: Stacey Boyd  [id number]  [id number]  Date of Service: 09/22/2023  Chief Complaint  Patient presents with   Follow-up   Diabetes   Depression   Hyperlipidemia   Quality Metric Gaps    Colonoscopy    HPI Stacey Boyd presents for a follow-up visit for diabetes, high cholesterol, abnormal kidney function and elevated TSH. Diabetes -- A1c remains stable and slightly further improved at 6.0 High cholesterol -- slightly improved on labs Kidney function improved back to normal range TSH is slightly elevated ay 4.710. not currently on levothyroxine .     Current Medication: Outpatient Encounter Medications as of 09/22/2023  Medication Sig   acetaminophen  (TYLENOL ) 650 MG CR tablet Take 650-1,300 mg by mouth every 8 (eight) hours as needed for pain.   aspirin  EC 81 MG tablet Take 1 tablet (81 mg total) by mouth daily. Swallow whole. Please get OTC.   Cholecalciferol  (VITAMIN D3) 5000 units TABS Take 5,000 Units by mouth daily.    colchicine  0.6 MG tablet ON FIRST DAY OF GOUT FLARE TAKE 2 TABLETS, THEN TAKE 1 ADIDTIONAL TABLET AN HOUR LATER. THEN TAKE 1 TABLET DAILY UNTIL FLARE RESOLVES.   cyclobenzaprine  (FLEXERIL ) 5 MG tablet TAKE ONE TABLET BY MOUTH AT BEDTIME   diclofenac  (VOLTAREN ) 75 MG EC tablet TAKE ONE TABLET BY MOUTH TWICE DAILY   dorzolamide -timolol  (COSOPT ) 22.3-6.8 MG/ML ophthalmic solution Place 1 drop into both eyes 2 (two) times daily.   Flaxseed, Linseed, (EQL FLAXSEED OIL) 1200 MG CAPS Take 1 capsule by mouth daily. Please get OTC   latanoprost  (XALATAN ) 0.005 % ophthalmic solution Place 1 drop into both eyes at bedtime.   levocetirizine (XYZAL ) 5 MG tablet Take 1 tablet (5 mg total) by mouth every evening.   metFORMIN  (GLUCOPHAGE -XR) 500 MG 24 hr tablet Take 1 tablet (500 mg total) by mouth daily with breakfast.   mirtazapine  (REMERON ) 15 MG tablet TAKE 1  TABLET BY MOUTH AT BEDTIME   risperiDONE  (RISPERDAL  M-TABS) 2 MG disintegrating tablet Take 1 tablet (2 mg total) by mouth at bedtime.   sertraline  (ZOLOFT ) 100 MG tablet Take 1 tablet (100 mg total) by mouth daily.   traZODone  (DESYREL ) 100 MG tablet Take 1-2 tablets (100-200 mg total) by mouth at bedtime.   [DISCONTINUED] atorvastatin  (LIPITOR) 10 MG tablet TAKE 1 TABLET BY MOUTH AT BEDTIME FOR CHOLESTEROL   atorvastatin  (LIPITOR) 10 MG tablet TAKE 1 TABLET BY MOUTH AT BEDTIME FOR CHOLESTEROL   No facility-administered encounter medications on file as of 09/22/2023.    Surgical History: Past Surgical History:  Procedure Laterality Date   ABDOMINAL HYSTERECTOMY  2005   APPENDECTOMY  1980   CARDIAC CATHETERIZATION Left 09/17/2015   Procedure: Left Heart Cath and Coronary Angiography;  Surgeon: Ronney Cola, MD;  Location: [facility] INVASIVE [facility];  Service: Cardiovascular;  Laterality: Left;   CARPAL TUNNEL RELEASE     CATARACT EXTRACTION W/PHACO Left 09/25/2019   Procedure: CATARACT EXTRACTION PHACO AND INTRAOCULAR LENS PLACEMENT (IOC) LEFT   4.45  00:36.1;  Surgeon: Clair Crews, MD;  Location: [facility];  Service: Ophthalmology;  Laterality: Left;   CATARACT EXTRACTION W/PHACO Right 10/16/2019   Procedure: CATARACT EXTRACTION PHACO AND INTRAOCULAR LENS PLACEMENT (IOC)right  DIABETIC;  Surgeon: Clair Crews, MD;  Location: [facility];  Service: Ophthalmology;  Laterality: Right;  5.29 0:33.7   COLONOSCOPY WITH PROPOFOL  N/A 11/07/2017   Procedure:  COLONOSCOPY WITH PROPOFOL ;  Surgeon: Luke Salaam, MD;  Location: [facility]COPY;  Service: Gastroenterology;  Laterality: N/A;   FOOT SURGERY Left    2 nodules removed   FRACTURE SURGERY Right    wrist   WRIST FRACTURE SURGERY Right     Medical History: Past Medical History:  Diagnosis Date   Allergy    Anxiety    Arthritis    legs   Cancer (HCC)    skin cancer on right shoulder.    Depression    Diabetes  mellitus without complication (HCC)    Diverticulitis    Dyspnea    with exertion   Glaucoma    Hyperlipidemia    Hypertension    controlled on meds   Hypothyroidism    Liver cyst    Osteoporosis    Shoulder pain, left    and arm   Thyroid  disease    Wrist fracture 2001   right    Family History: Family History  Problem Relation Age of Onset   Heart disease Mother        h/o rheumatic fever   Heart disease Father    Stroke Father    Diabetes Sister    Diabetes Brother    Diabetes Sister    Mental illness Other    Colon cancer Neg Hx    Breast cancer Neg Hx     Social History   Socioeconomic History   Marital status: Divorced    Spouse name: Not on file   Number of children: 3   Years of education: Not on file   Highest education level: 10th grade  Occupational History   Not on file  Tobacco Use   Smoking status: Former    Current packs/day: 0.00    Average packs/day: 0.3 packs/day for 10.0 years (2.5 ttl pk-yrs)    Types: Cigarettes    Start date: 09/16/1969    Quit date: 09/17/1979    Years since quitting: 44.0   Smokeless tobacco: Never  Vaping Use   Vaping status: Never Used  Substance and Sexual Activity   Alcohol use: No   Drug use: No   Sexual activity: Yes    Partners: Male    Birth control/protection: None  Other Topics Concern   Not on file  Social History Narrative   3 kids, local   Lives with daughter as of 2016   Divorced ~2002   Social Drivers of Health   Financial Resource Strain: Low Risk  (12/29/2017)   Overall Financial Resource Strain (CARDIA)    Difficulty of Paying Living Expenses: Not hard at all  Food Insecurity: No Food Insecurity (12/29/2017)   Hunger Vital Sign    Worried About Running Out of Food in the Last Year: Never true    Ran Out of Food in the Last Year: Never true  Transportation Needs: No Transportation Needs (12/29/2017)   PRAPARE - Administrator, Civil Service (Medical): No    Lack of  Transportation (Non-Medical): No  Physical Activity: Inactive (12/29/2017)   Exercise Vital Sign    Days of Exercise per Week: 0 days    Minutes of Exercise per Session: 0 min  Stress: No Stress Concern Present (12/29/2017)   [employer] of Occupational Health - Occupational Stress Questionnaire    Feeling of Stress : Not at all  Social Connections: Unknown (12/29/2017)   Social Connection and Isolation Panel [NHANES]    Frequency of Communication with Friends and Family: Not on file  Frequency of Social Gatherings with Friends and Family: Not on file    Attends Religious Services: 1 to 4 times per year    Active Member of [employer] or Organizations: No    Attends Banker Meetings: Never    Marital Status: Divorced  Catering manager Violence: Not At Risk (12/29/2017)   Humiliation, Afraid, Rape, and Kick questionnaire    Fear of Current or Ex-Partner: No    Emotionally Abused: No    Physically Abused: No    Sexually Abused: No      Review of Systems  Constitutional:  Positive for activity change, appetite change and fatigue. Negative for chills, fever and unexpected weight change.  HENT: Negative.  Negative for congestion, ear pain, rhinorrhea, sneezing, sore throat and trouble swallowing.   Eyes: Negative.  Negative for redness.  Respiratory:  Positive for shortness of breath. Negative for cough, chest tightness and wheezing.   Cardiovascular: Negative.  Negative for chest pain and palpitations.  Gastrointestinal: Negative.  Negative for abdominal pain, blood in stool, constipation, diarrhea, nausea and vomiting.  Endocrine: Negative.   Genitourinary: Negative.  Negative for difficulty urinating, dysuria, frequency, hematuria and urgency.  Musculoskeletal:  Positive for arthralgias, back pain, gait problem and neck pain. Negative for joint swelling and myalgias.  Skin: Negative.  Negative for rash and wound.  Allergic/Immunologic: Negative.  Negative for  immunocompromised state.  Neurological:  Positive for dizziness and weakness. Negative for tremors, seizures, numbness and headaches.  Hematological: Negative.  Negative for adenopathy. Does not bruise/bleed easily.  Psychiatric/Behavioral:  Positive for behavioral problems (Depression), decreased concentration and sleep disturbance. Negative for self-injury and suicidal ideas. The patient is nervous/anxious.     Vital Signs: BP 130/70 Comment: 175/82  Pulse 71   Temp 98.4 F (36.9 C)   Resp 16   Ht 5\' 3"  (1.6 m)   Wt 154 lb (69.9 kg)   SpO2 97%   BMI 27.28 kg/m    Physical Exam Vitals reviewed.  Constitutional:      General: She is awake. She is not in acute distress.    Appearance: Normal appearance. She is normal weight. She is not ill-appearing.  HENT:     Head: Normocephalic and atraumatic.  Eyes:     Pupils: Pupils are equal, round, and reactive to light.  Cardiovascular:     Rate and Rhythm: Normal rate and regular rhythm.  Pulmonary:     Effort: Pulmonary effort is normal. No respiratory distress.  Neurological:     Mental Status: She is alert and oriented to person, place, and time.  Psychiatric:        Attention and Perception: She is inattentive.        Mood and Affect: Mood is depressed. Affect is blunt.        Speech: Speech is delayed.        Behavior: Behavior is slowed and withdrawn. Behavior is cooperative.        Thought Content: Thought content is not paranoid or delusional. Thought content does not include homicidal or suicidal ideation.        Cognition and Memory: Cognition is not impaired. Memory is impaired.        Judgment: Judgment normal. Judgment is not impulsive.        Assessment/Plan: 1. Type 2 diabetes mellitus with stage 3a chronic kidney disease, without long-term current use of insulin (HCC) (Primary) A1c is stable, urine sent for microalbumin/creatinine ratio. Continue medications as prescribed.  - Urine Microalbumin  w/creat.  ratio - POCT HgB A1C  2. Aortic atherosclerosis (HCC) Continue atorvastatin  as prescribed.  - atorvastatin  (LIPITOR) 10 MG tablet; TAKE 1 TABLET BY MOUTH AT BEDTIME FOR CHOLESTEROL  Dispense: 90 tablet; Refill: 1  3. Abnormal thyroid  function test Will repeat lab in 2-3 months   4. Abnormal kidney function study Labs are back within normal range, will continue to check labs periodically   General Counseling: gracynn rajewski understanding of the findings of todays visit and agrees with plan of treatment. I have discussed any further diagnostic evaluation that may be needed or ordered today. We also reviewed her medications today. she has been encouraged to call the office with any questions or concerns that should arise related to todays visit.    Orders Placed This Encounter  Procedures   Urine Microalbumin w/creat. ratio   POCT HgB A1C    Meds ordered this encounter  Medications   atorvastatin  (LIPITOR) 10 MG tablet    Sig: TAKE 1 TABLET BY MOUTH AT BEDTIME FOR CHOLESTEROL    Dispense:  90 tablet    Refill:  1    FOR NEXT FILL    Return in about 4 months (around 01/23/2024) for F/U, Recheck A1C, Jaziel Bennett PCP.   Total time spent:30 Minutes Time spent includes review of chart, medications, test results, and follow up plan with the patient.   [place] Controlled Substance Database was reviewed by me.  This patient was seen by Laurence Pons, FNP-C in collaboration with Dr. Verneta Gone as a part of collaborative care agreement.   Taequan Stockhausen R. Bobbi Burow, MSN, FNP-C Internal medicine

## 2023-09-23 LAB — MICROALBUMIN / CREATININE URINE RATIO
Creatinine, Urine: 136.4 mg/dL
Microalb/Creat Ratio: 7 mg/g{creat} (ref 0–29)
Microalbumin, Urine: 9.6 ug/mL

## 2023-09-28 ENCOUNTER — Ambulatory Visit: Admitting: Nurse Practitioner

## 2023-10-19 ENCOUNTER — Other Ambulatory Visit: Payer: Self-pay | Admitting: Nurse Practitioner

## 2023-10-19 DIAGNOSIS — M150 Primary generalized (osteo)arthritis: Secondary | ICD-10-CM

## 2023-11-23 ENCOUNTER — Other Ambulatory Visit: Payer: Self-pay | Admitting: Nurse Practitioner

## 2023-11-23 DIAGNOSIS — Z760 Encounter for issue of repeat prescription: Secondary | ICD-10-CM

## 2024-01-14 ENCOUNTER — Other Ambulatory Visit: Payer: Self-pay | Admitting: Nurse Practitioner

## 2024-01-14 DIAGNOSIS — F5104 Psychophysiologic insomnia: Secondary | ICD-10-CM

## 2024-01-14 DIAGNOSIS — M150 Primary generalized (osteo)arthritis: Secondary | ICD-10-CM

## 2024-01-19 DIAGNOSIS — H401132 Primary open-angle glaucoma, bilateral, moderate stage: Secondary | ICD-10-CM | POA: Diagnosis not present

## 2024-01-23 ENCOUNTER — Ambulatory Visit: Admitting: Nurse Practitioner

## 2024-01-23 ENCOUNTER — Encounter: Payer: Self-pay | Admitting: Nurse Practitioner

## 2024-01-23 VITALS — BP 115/75 | HR 65 | Temp 97.2°F | Resp 16 | Ht 63.0 in | Wt 151.6 lb

## 2024-01-23 DIAGNOSIS — D692 Other nonthrombocytopenic purpura: Secondary | ICD-10-CM

## 2024-01-23 DIAGNOSIS — R2689 Other abnormalities of gait and mobility: Secondary | ICD-10-CM

## 2024-01-23 DIAGNOSIS — E039 Hypothyroidism, unspecified: Secondary | ICD-10-CM | POA: Diagnosis not present

## 2024-01-23 DIAGNOSIS — H401132 Primary open-angle glaucoma, bilateral, moderate stage: Secondary | ICD-10-CM

## 2024-01-23 DIAGNOSIS — G2401 Drug induced subacute dyskinesia: Secondary | ICD-10-CM

## 2024-01-23 DIAGNOSIS — E1169 Type 2 diabetes mellitus with other specified complication: Secondary | ICD-10-CM

## 2024-01-23 DIAGNOSIS — J301 Allergic rhinitis due to pollen: Secondary | ICD-10-CM

## 2024-01-23 DIAGNOSIS — I700 Atherosclerosis of aorta: Secondary | ICD-10-CM

## 2024-01-23 DIAGNOSIS — E785 Hyperlipidemia, unspecified: Secondary | ICD-10-CM

## 2024-01-23 DIAGNOSIS — N1831 Chronic kidney disease, stage 3a: Secondary | ICD-10-CM

## 2024-01-23 DIAGNOSIS — R42 Dizziness and giddiness: Secondary | ICD-10-CM

## 2024-01-23 DIAGNOSIS — M3501 Sicca syndrome with keratoconjunctivitis: Secondary | ICD-10-CM

## 2024-01-23 DIAGNOSIS — E1122 Type 2 diabetes mellitus with diabetic chronic kidney disease: Secondary | ICD-10-CM

## 2024-01-23 LAB — POCT GLYCOSYLATED HEMOGLOBIN (HGB A1C): Hemoglobin A1C: 5.9 % — AB (ref 4.0–5.6)

## 2024-01-23 MED ORDER — LEVOCETIRIZINE DIHYDROCHLORIDE 5 MG PO TABS
5.0000 mg | ORAL_TABLET | Freq: Every evening | ORAL | 5 refills | Status: AC
Start: 2024-01-23 — End: ?

## 2024-01-23 MED ORDER — ATORVASTATIN CALCIUM 10 MG PO TABS: ORAL_TABLET | ORAL | 1 refills | Status: AC

## 2024-01-23 MED ORDER — ARTIFICIAL TEARS 0.1-0.3 % OP SOLN: 1.0000 [drp] | Freq: Two times a day (BID) | OPHTHALMIC | 5 refills | Status: AC | PRN

## 2024-01-23 MED ORDER — MECLIZINE HCL 12.5 MG PO TABS: 12.5000 mg | ORAL_TABLET | Freq: Three times a day (TID) | ORAL | 3 refills | Status: AC | PRN

## 2024-01-23 NOTE — Progress Notes (Signed)
 [facility] [address] [place], [place] [address]  Internal MEDICINE  Office Visit Note  Patient Name: Stacey Boyd  [id number]  [id number]  Date of Service: 01/23/2024  Chief Complaint  Patient presents with   Depression   Diabetes   Hyperlipidemia   Hypertension   Follow-up    HPI Stacey Boyd presents for a follow-up visit for diabetes, hypothyroidism, high cholesterol, atherosclerosis, history of falls, senile purpura, allergies, and dizziness.  Diabetes -- A1c is stable at 5.9 today, slightly improved from 6.0 in may earlier this year. She is on metformin  High cholesterol and aortic atherosclerosis -- currently taking atorvastatin  Hypothyroidism -- not currently taking any levothyroxine , prior labs are stable.  Senile purpura -- noted with thinning skin and easy bruising  History of falls -- prior falls, unsteady on her feet at times, persistent dizziness  Seasonal allergies -- takes levocetirizine daily.  Dizziness -- takes meclizine  as needed.     Current Medication: Outpatient Encounter Medications as of 01/23/2024  Medication Sig   acetaminophen  (TYLENOL ) 650 MG CR tablet Take 650-1,300 mg by mouth every 8 (eight) hours as needed for pain.   aspirin  EC 81 MG tablet Take 1 tablet (81 mg total) by mouth daily. Swallow whole. Please get OTC.   atorvastatin  (LIPITOR) 10 MG tablet TAKE 1 TABLET BY MOUTH AT BEDTIME FOR CHOLESTEROL   Cholecalciferol  (VITAMIN D3) 5000 units TABS Take 5,000 Units by mouth daily.    colchicine  0.6 MG tablet ON FIRST DAY OF GOUT FLARE TAKE 2 TABLETS, THEN TAKE 1 ADIDTIONAL TABLET AN HOUR LATER. THEN TAKE 1 TABLET DAILY UNTIL FLARE RESOLVES.   cyclobenzaprine  (FLEXERIL ) 5 MG tablet TAKE ONE TABLET BY MOUTH AT BEDTIME   Dextran 70-Hypromellose (ARTIFICIAL TEARS) 0.1-0.3 % SOLN Place 1 drop into both eyes 2 (two) times daily as needed (dry eyes).   diclofenac  (VOLTAREN ) 75 MG EC tablet TAKE ONE TABLET BY MOUTH TWICE DAILY    dorzolamide -timolol  (COSOPT ) 22.3-6.8 MG/ML ophthalmic solution Place 1 drop into both eyes 2 (two) times daily.   Flaxseed, Linseed, (EQL FLAXSEED OIL) 1200 MG CAPS Take 1 capsule by mouth daily. Please get OTC   latanoprost  (XALATAN ) 0.005 % ophthalmic solution Place 1 drop into both eyes at bedtime.   levocetirizine (XYZAL ) 5 MG tablet Take 1 tablet (5 mg total) by mouth every evening.   meclizine  (ANTIVERT ) 12.5 MG tablet Take 1 tablet (12.5 mg total) by mouth 3 (three) times daily as needed for dizziness.   metFORMIN  (GLUCOPHAGE -XR) 500 MG 24 hr tablet Take 1 tablet (500 mg total) by mouth daily with breakfast.   mirtazapine  (REMERON ) 15 MG tablet TAKE 1 TABLET BY MOUTH AT BEDTIME   risperiDONE  (RISPERDAL  M-TABS) 2 MG disintegrating tablet Take 1 tablet (2 mg total) by mouth at bedtime.   sertraline  (ZOLOFT ) 100 MG tablet Take 1 tablet (100 mg total) by mouth daily.   traZODone  (DESYREL ) 100 MG tablet TAKE 1 TO 2 TABLETS AT BEDTIME   [DISCONTINUED] atorvastatin  (LIPITOR) 10 MG tablet TAKE 1 TABLET BY MOUTH AT BEDTIME FOR CHOLESTEROL   [DISCONTINUED] levocetirizine (XYZAL ) 5 MG tablet Take 1 tablet (5 mg total) by mouth every evening.   No facility-administered encounter medications on file as of 01/23/2024.    Surgical History: Past Surgical History:  Procedure Laterality Date   ABDOMINAL HYSTERECTOMY  2005   APPENDECTOMY  1980   CARDIAC CATHETERIZATION Left 09/17/2015   Procedure: Left Heart Cath and Coronary Angiography;  Surgeon: Vinie DELENA Jude, MD;  Location:  [facility];  Service: Cardiovascular;  Laterality: Left;   CARPAL TUNNEL RELEASE     CATARACT EXTRACTION W/PHACO Left 09/25/2019   Procedure: CATARACT EXTRACTION PHACO AND INTRAOCULAR LENS PLACEMENT (IOC) LEFT   4.45  00:36.1;  Surgeon: Jaye Fallow, MD;  Location: [facility];  Service: Ophthalmology;  Laterality: Left;   CATARACT EXTRACTION W/PHACO Right 10/16/2019   Procedure: CATARACT EXTRACTION PHACO AND  INTRAOCULAR LENS PLACEMENT (IOC)right  DIABETIC;  Surgeon: Jaye Fallow, MD;  Location: [facility];  Service: Ophthalmology;  Laterality: Right;  5.29 0:33.7   COLONOSCOPY WITH PROPOFOL  N/A 11/07/2017   Procedure: COLONOSCOPY WITH PROPOFOL ;  Surgeon: Therisa Bi, MD;  Location[facility];  Service: Gastroenterology;  Laterality: N/A;   FOOT SURGERY Left    2 nodules removed   FRACTURE SURGERY Right    wrist   WRIST FRACTURE SURGERY Right     Medical History: Past Medical History:  Diagnosis Date   Allergy    Anxiety    Arthritis    legs   Cancer (HCC)    skin cancer on right shoulder.    Depression    Diabetes mellitus without complication (HCC)    Diverticulitis    Dyspnea    with exertion   Glaucoma    Hyperlipidemia    Hypertension    controlled on meds   Hypothyroidism    Liver cyst    Osteoporosis    Shoulder pain, left    and arm   Thyroid  disease    Wrist fracture 2001   right    Family History: Family History  Problem Relation Age of Onset   Heart disease Mother        h/o rheumatic fever   Heart disease Father    Stroke Father    Diabetes Sister    Diabetes Brother    Diabetes Sister    Mental illness Other    Colon cancer Neg Hx    Breast cancer Neg Hx     Social History   Socioeconomic History   Marital status: Divorced    Spouse name: Not on file   Number of children: 3   Years of education: Not on file   Highest education level: 10th grade  Occupational History   Not on file  Tobacco Use   Smoking status: Former    Current packs/day: 0.00    Average packs/day: 0.3 packs/day for 10.0 years (2.5 ttl pk-yrs)    Types: Cigarettes    Start date: 09/16/1969    Quit date: 09/17/1979    Years since quitting: 44.3   Smokeless tobacco: Never  Vaping Use   Vaping status: Never Used  Substance and Sexual Activity   Alcohol use: No   Drug use: No   Sexual activity: Yes    Partners: Male    Birth control/protection: None   Other Topics Concern   Not on file  Social History Narrative   3 kids, local   Lives with daughter as of 2016   Divorced ~2002   Social Drivers of Health   Financial Resource Strain: Low Risk  (12/29/2017)   Overall Financial Resource Strain (CARDIA)    Difficulty of Paying Living Expenses: Not hard at all  Food Insecurity: No Food Insecurity (12/29/2017)   Hunger Vital Sign    Worried About Running Out of Food in the Last Year: Never true    Ran Out of Food in the Last Year: Never true  Transportation Needs: No Transportation Needs (  12/29/2017)   PRAPARE - Administrator, Civil Service (Medical): No    Lack of Transportation (Non-Medical): No  Physical Activity: Inactive (12/29/2017)   Exercise Vital Sign    Days of Exercise per Week: 0 days    Minutes of Exercise per Session: 0 min  Stress: No Stress Concern Present (12/29/2017)   [employer] of Occupational Health - Occupational Stress Questionnaire    Feeling of Stress : Not at all  Social Connections: Unknown (12/29/2017)   Social Connection and Isolation Panel    Frequency of Communication with Friends and Family: Not on file    Frequency of Social Gatherings with Friends and Family: Not on file    Attends Religious Services: 1 to 4 times per year    Active Member of [employer] or Organizations: No    Attends Banker Meetings: Never    Marital Status: Divorced  Catering manager Violence: Not At Risk (12/29/2017)   Humiliation, Afraid, Rape, and Kick questionnaire    Fear of Current or Ex-Partner: No    Emotionally Abused: No    Physically Abused: No    Sexually Abused: No      Review of Systems  Constitutional:  Positive for activity change, appetite change and fatigue. Negative for chills, fever and unexpected weight change.  HENT: Negative.  Negative for congestion, ear pain, rhinorrhea, sneezing, sore throat and trouble swallowing.   Eyes: Negative.  Negative for redness.  Respiratory:   Positive for shortness of breath. Negative for cough, chest tightness and wheezing.   Cardiovascular: Negative.  Negative for chest pain and palpitations.  Gastrointestinal: Negative.  Negative for abdominal pain, blood in stool, constipation, diarrhea, nausea and vomiting.  Endocrine: Negative.   Genitourinary: Negative.  Negative for difficulty urinating, dysuria, frequency, hematuria and urgency.  Musculoskeletal:  Positive for arthralgias, back pain, gait problem and neck pain. Negative for joint swelling and myalgias.  Skin: Negative.  Negative for rash and wound.  Allergic/Immunologic: Negative.  Negative for immunocompromised state.  Neurological:  Positive for dizziness and weakness. Negative for tremors, seizures, numbness and headaches.  Hematological: Negative.  Negative for adenopathy. Does not bruise/bleed easily.  Psychiatric/Behavioral:  Positive for behavioral problems (Depression), decreased concentration and sleep disturbance. Negative for self-injury and suicidal ideas. The patient is nervous/anxious.     Vital Signs: BP 115/75   Pulse 65   Temp (!) 97.2 F (36.2 C)   Resp 16   Ht 5' 3 (1.6 m)   Wt 151 lb 9.6 oz (68.8 kg)   SpO2 96%   BMI 26.85 kg/m    Physical Exam Vitals reviewed.  Constitutional:      General: She is awake. She is not in acute distress.    Appearance: Normal appearance. She is normal weight. She is not ill-appearing.  HENT:     Head: Normocephalic and atraumatic.  Eyes:     Pupils: Pupils are equal, round, and reactive to light.  Cardiovascular:     Rate and Rhythm: Normal rate and regular rhythm.  Pulmonary:     Effort: Pulmonary effort is normal. No respiratory distress.  Neurological:     Mental Status: She is alert and oriented to person, place, and time.  Psychiatric:        Attention and Perception: She is inattentive.        Mood and Affect: Mood is depressed. Affect is blunt.        Speech: Speech is delayed.  Behavior: Behavior is slowed and withdrawn. Behavior is cooperative.        Thought Content: Thought content is not paranoid or delusional. Thought content does not include homicidal or suicidal ideation.        Cognition and Memory: Cognition is not impaired. Memory is impaired.        Judgment: Judgment normal. Judgment is not impulsive.        Assessment/Plan: 1. Type 2 diabetes mellitus with stage 3a chronic kidney disease, without long-term current use of insulin (HCC) (Primary) A1c is stable, continue metformin  as prescribed.  - POCT glycosylated hemoglobin (Hb A1C)  2. Hyperlipidemia associated with type 2 diabetes mellitus (HCC) Continue atorvastatin  as prescribed.   3. Acquired hypothyroidism No interventions at this time. Not currently on medication, last labs were stable.   4. Aortic atherosclerosis Continue atorvastatin  as prescribed.  - atorvastatin  (LIPITOR) 10 MG tablet; TAKE 1 TABLET BY MOUTH AT BEDTIME FOR CHOLESTEROL  Dispense: 90 tablet; Refill: 1  5. Sjogren syndrome with keratoconjunctivitis Artifical tears prescribed as needed.  - Dextran 70-Hypromellose (ARTIFICIAL TEARS) 0.1-0.3 % SOLN; Place 1 drop into both eyes 2 (two) times daily as needed (dry eyes).  Dispense: 30 mL; Refill: 5  6. Primary open angle glaucoma (POAG) of both eyes, moderate stage Sees ophthalmology   7. Senile purpura Noted, no interventions at this time.   8. Tardive dyskinesia Mild to moderate. Currently on risperidone    9. Seasonal allergic rhinitis due to pollen Try levocetirizine as prescribed.  - levocetirizine (XYZAL ) 5 MG tablet; Take 1 tablet (5 mg total) by mouth every evening.  Dispense: 30 tablet; Refill: 5  10. Impaired gait and mobility The dizziness contributes to this problem and this increases her risk for falls.   11. Dizziness Continue prn meclizine  as prescribed  - meclizine  (ANTIVERT ) 12.5 MG tablet; Take 1 tablet (12.5 mg total) by mouth 3 (three) times  daily as needed for dizziness.  Dispense: 90 tablet; Refill: 3   General Counseling: Stacey Boyd verbalizes understanding of the findings of todays visit and agrees with plan of treatment. I have discussed any further diagnostic evaluation that may be needed or ordered today. We also reviewed her medications today. she has been encouraged to call the office with any questions or concerns that should arise related to todays visit.    Orders Placed This Encounter  Procedures   POCT glycosylated hemoglobin (Hb A1C)    Meds ordered this encounter  Medications   atorvastatin  (LIPITOR) 10 MG tablet    Sig: TAKE 1 TABLET BY MOUTH AT BEDTIME FOR CHOLESTEROL    Dispense:  90 tablet    Refill:  1    FOR NEXT FILL   levocetirizine (XYZAL ) 5 MG tablet    Sig: Take 1 tablet (5 mg total) by mouth every evening.    Dispense:  30 tablet    Refill:  5    Fill new script today   Dextran 70-Hypromellose (ARTIFICIAL TEARS) 0.1-0.3 % SOLN    Sig: Place 1 drop into both eyes 2 (two) times daily as needed (dry eyes).    Dispense:  30 mL    Refill:  5    Fill new script today asap   meclizine  (ANTIVERT ) 12.5 MG tablet    Sig: Take 1 tablet (12.5 mg total) by mouth 3 (three) times daily as needed for dizziness.    Dispense:  90 tablet    Refill:  3    Fill new script today  Return in about 3 months (around 04/23/2024) for F/U, Natha Guin PCP.   Total time spent:30 Minutes Time spent includes review of chart, medications, test results, and follow up plan with the patient.   [place] Controlled Substance Database was reviewed by me.  This patient was seen by Mardy Maxin, FNP-C in collaboration with Dr. Sigrid Bathe as a part of collaborative care agreement.   Quy Lotts R. Maxin, MSN, FNP-C Internal medicine

## 2024-01-26 DIAGNOSIS — E119 Type 2 diabetes mellitus without complications: Secondary | ICD-10-CM | POA: Diagnosis not present

## 2024-01-26 DIAGNOSIS — H401132 Primary open-angle glaucoma, bilateral, moderate stage: Secondary | ICD-10-CM | POA: Diagnosis not present

## 2024-01-26 DIAGNOSIS — H26491 Other secondary cataract, right eye: Secondary | ICD-10-CM | POA: Diagnosis not present

## 2024-01-26 DIAGNOSIS — H35372 Puckering of macula, left eye: Secondary | ICD-10-CM | POA: Diagnosis not present

## 2024-03-02 ENCOUNTER — Encounter: Payer: Self-pay | Admitting: Nurse Practitioner

## 2024-03-02 DIAGNOSIS — M3501 Sicca syndrome with keratoconjunctivitis: Secondary | ICD-10-CM | POA: Insufficient documentation

## 2024-03-02 DIAGNOSIS — D692 Other nonthrombocytopenic purpura: Secondary | ICD-10-CM | POA: Insufficient documentation

## 2024-03-02 DIAGNOSIS — J301 Allergic rhinitis due to pollen: Secondary | ICD-10-CM | POA: Insufficient documentation

## 2024-03-02 DIAGNOSIS — E1169 Type 2 diabetes mellitus with other specified complication: Secondary | ICD-10-CM | POA: Insufficient documentation

## 2024-04-04 ENCOUNTER — Other Ambulatory Visit: Payer: Self-pay | Admitting: Nurse Practitioner

## 2024-04-04 DIAGNOSIS — M150 Primary generalized (osteo)arthritis: Secondary | ICD-10-CM

## 2024-04-23 ENCOUNTER — Ambulatory Visit: Admitting: Nurse Practitioner

## 2024-05-15 ENCOUNTER — Ambulatory Visit: Admitting: Nurse Practitioner

## 2024-05-15 ENCOUNTER — Encounter: Payer: Self-pay | Admitting: Nurse Practitioner

## 2024-05-15 VITALS — BP 114/83 | HR 62 | Temp 96.3°F | Resp 16 | Ht 63.0 in | Wt 149.0 lb

## 2024-05-15 DIAGNOSIS — R42 Dizziness and giddiness: Secondary | ICD-10-CM | POA: Diagnosis not present

## 2024-05-15 DIAGNOSIS — E785 Hyperlipidemia, unspecified: Secondary | ICD-10-CM | POA: Diagnosis not present

## 2024-05-15 DIAGNOSIS — E039 Hypothyroidism, unspecified: Secondary | ICD-10-CM

## 2024-05-15 DIAGNOSIS — B9689 Other specified bacterial agents as the cause of diseases classified elsewhere: Secondary | ICD-10-CM | POA: Diagnosis not present

## 2024-05-15 DIAGNOSIS — R531 Weakness: Secondary | ICD-10-CM | POA: Diagnosis not present

## 2024-05-15 DIAGNOSIS — R5381 Other malaise: Secondary | ICD-10-CM | POA: Diagnosis not present

## 2024-05-15 DIAGNOSIS — E1169 Type 2 diabetes mellitus with other specified complication: Secondary | ICD-10-CM | POA: Diagnosis not present

## 2024-05-15 DIAGNOSIS — N1831 Chronic kidney disease, stage 3a: Secondary | ICD-10-CM | POA: Diagnosis not present

## 2024-05-15 DIAGNOSIS — J028 Acute pharyngitis due to other specified organisms: Secondary | ICD-10-CM | POA: Diagnosis not present

## 2024-05-15 DIAGNOSIS — E1122 Type 2 diabetes mellitus with diabetic chronic kidney disease: Secondary | ICD-10-CM

## 2024-05-15 DIAGNOSIS — D692 Other nonthrombocytopenic purpura: Secondary | ICD-10-CM

## 2024-05-15 DIAGNOSIS — R2689 Other abnormalities of gait and mobility: Secondary | ICD-10-CM

## 2024-05-15 LAB — POCT GLYCOSYLATED HEMOGLOBIN (HGB A1C): Hemoglobin A1C: 5.8 % — AB (ref 4.0–5.6)

## 2024-05-15 MED ORDER — AZITHROMYCIN 250 MG PO TABS: ORAL_TABLET | ORAL | 0 refills | Status: AC

## 2024-05-15 NOTE — Progress Notes (Signed)
 [facility] [address] [place], [place] [address]  Internal MEDICINE  Office Visit Note  Patient Name: Stacey Boyd  [id number]  [id number]  Date of Service: 05/15/2024  Chief Complaint  Patient presents with   Depression   Diabetes   Hyperlipidemia   Hypertension   Follow-up    HPI Stacey Boyd presents for a follow-up visit for diabetes, high cholesterol, hypothyroidism, dizziness and weakness.  Diabetes -- doing well, A1c is still stable.  Senile purpura High cholesterol -- takes atorvastatin  daily  Hypothyroidism --  takes levothyroxine  as prescribed.  Dizziness, impaired gait and mobility, generalized weakness  Current Medication: Outpatient Encounter Medications as of 05/15/2024  Medication Sig   azithromycin  (ZITHROMAX ) 250 MG tablet Take 2 tablets on day 1, then 1 tablet daily on days 2 through 5   acetaminophen  (TYLENOL ) 650 MG CR tablet Take 650-1,300 mg by mouth every 8 (eight) hours as needed for pain.   aspirin  EC 81 MG tablet Take 1 tablet (81 mg total) by mouth daily. Swallow whole. Please get OTC.   atorvastatin  (LIPITOR) 10 MG tablet TAKE 1 TABLET BY MOUTH AT BEDTIME FOR CHOLESTEROL   Cholecalciferol  (VITAMIN D3) 5000 units TABS Take 5,000 Units by mouth daily.    colchicine  0.6 MG tablet ON FIRST DAY OF GOUT FLARE TAKE 2 TABLETS, THEN TAKE 1 ADIDTIONAL TABLET AN HOUR LATER. THEN TAKE 1 TABLET DAILY UNTIL FLARE RESOLVES.   cyclobenzaprine  (FLEXERIL ) 5 MG tablet TAKE ONE TABLET BY MOUTH AT BEDTIME   Dextran 70-Hypromellose (ARTIFICIAL TEARS) 0.1-0.3 % SOLN Place 1 drop into both eyes 2 (two) times daily as needed (dry eyes).   diclofenac  (VOLTAREN ) 75 MG EC tablet TAKE ONE TABLET BY MOUTH TWICE DAILY   dorzolamide -timolol  (COSOPT ) 22.3-6.8 MG/ML ophthalmic solution Place 1 drop into both eyes 2 (two) times daily.   Flaxseed, Linseed, (EQL FLAXSEED OIL) 1200 MG CAPS Take 1 capsule by mouth daily. Please get OTC   latanoprost  (XALATAN ) 0.005 %  ophthalmic solution Place 1 drop into both eyes at bedtime.   levocetirizine (XYZAL ) 5 MG tablet Take 1 tablet (5 mg total) by mouth every evening.   meclizine  (ANTIVERT ) 12.5 MG tablet Take 1 tablet (12.5 mg total) by mouth 3 (three) times daily as needed for dizziness.   metFORMIN  (GLUCOPHAGE -XR) 500 MG 24 hr tablet Take 1 tablet (500 mg total) by mouth daily with breakfast.   mirtazapine  (REMERON ) 15 MG tablet TAKE 1 TABLET BY MOUTH AT BEDTIME   risperiDONE  (RISPERDAL  M-TABS) 2 MG disintegrating tablet Take 1 tablet (2 mg total) by mouth at bedtime.   sertraline  (ZOLOFT ) 100 MG tablet Take 1 tablet (100 mg total) by mouth daily.   traZODone  (DESYREL ) 100 MG tablet TAKE 1 TO 2 TABLETS AT BEDTIME   No facility-administered encounter medications on file as of 05/15/2024.    Surgical History: Past Surgical History:  Procedure Laterality Date   ABDOMINAL HYSTERECTOMY  2005   APPENDECTOMY  1980   CARDIAC CATHETERIZATION Left 09/17/2015   Procedure: Left Heart Cath and Coronary Angiography;  Surgeon: Vinie DELENA Jude, MD;  Location: [facility] IN[facility]IVE [facility];  Service: Cardiovascular;  Laterality: Left;   CARPAL TUNNEL RELEASE     CATARACT EXTRACTION W/PHACO Left 09/25/2019   Procedure: CATARACT EXTRACTION PHACO AND INTRAOCULAR LENS PLACEMENT (IOC) LEFT   4.45  00:36.1;  Surgeon: Jaye Fallow, MD;  Location: [facility];  Service: Ophthalmology;  Laterality: Left;   CATARACT EXTRACTION W/PHACO Right 10/16/2019   Procedure: CATARACT EXTRACTION PHACO  AND INTRAOCULAR LENS PLACEMENT (IOC)right  DIABETIC;  Surgeon: Jaye Fallow, MD;  Location: [facility];  Service: Ophthalmology;  Laterality: Right;  5.29 0:33.7   COLONOSCOPY WITH PROPOFOL  N/A 11/07/2017   Procedure: COLONOSCOPY WITH PROPOFOL ;  Surgeon: Therisa Bi, MD;  Location: [facility];  Service: Gastroenterology;  Laterality: N/A;   FOOT SURGERY Left    2 nodules removed   FRACTURE SURGERY Right    wrist   WRIST  FRACTURE SURGERY Right     Medical History: Past Medical History:  Diagnosis Date   Allergy    Anxiety    Arthritis    legs   Cancer (HCC)    skin cancer on right shoulder.    Depression    Diabetes mellitus without complication (HCC)    Diverticulitis    Dyspnea    with exertion   Glaucoma    Hyperlipidemia    Hypertension    controlled on meds   Hypothyroidism    Liver cyst    Osteoporosis    Shoulder pain, left    and arm   Thyroid  disease    Wrist fracture 2001   right    Family History: Family History  Problem Relation Age of Onset   Heart disease Mother        h/o rheumatic fever   Heart disease Father    Stroke Father    Diabetes Sister    Diabetes Brother    Diabetes Sister    Mental illness Other    Colon cancer Neg Hx    Breast cancer Neg Hx     Social History   Socioeconomic History   Marital status: Divorced    Spouse name: Not on file   Number of children: 3   Years of education: Not on file   Highest education level: 10th grade  Occupational History   Not on file  Tobacco Use   Smoking status: Former    Current packs/day: 0.00    Average packs/day: 0.3 packs/day for 10.0 years (2.5 ttl pk-yrs)    Types: Cigarettes    Start date: 09/16/1969    Quit date: 09/17/1979    Years since quitting: 44.6   Smokeless tobacco: Never  Vaping Use   Vaping status: Never Used  Substance and Sexual Activity   Alcohol use: No   Drug use: No   Sexual activity: Yes    Partners: Male    Birth control/protection: None  Other Topics Concern   Not on file  Social History Narrative   3 kids, local   Lives with daughter as of 2016   Divorced ~2002   Social Drivers of Health   Tobacco Use: Medium Risk (05/15/2024)   Patient History    Smoking Tobacco Use: Former    Smokeless Tobacco Use: Never    Passive Exposure: Not on Actuary Strain: Not on file  Food Insecurity: Not on file  Transportation Needs: Not on file  Physical  Activity: Not on file  Stress: Not on file  Social Connections: Not on file  Intimate Partner Violence: Not on file  Depression (PHQ2-9): Low Risk (08/26/2023)   Depression (PHQ2-9)    PHQ-2 Score: 0  Alcohol Screen: Not on file  Housing: Not on file  Utilities: Not on file  Health Literacy: Not on file      Review of Systems  Constitutional:  Positive for activity change, appetite change and fatigue. Negative for chills, fever and unexpected weight change.  HENT: Negative.  Negative for congestion, ear pain, rhinorrhea, sneezing, sore throat and trouble swallowing.   Eyes: Negative.  Negative for redness.  Respiratory:  Positive for shortness of breath. Negative for cough, chest tightness and wheezing.   Cardiovascular: Negative.  Negative for chest pain and palpitations.  Gastrointestinal: Negative.  Negative for abdominal pain, blood in stool, constipation, diarrhea, nausea and vomiting.  Endocrine: Negative.   Genitourinary: Negative.  Negative for difficulty urinating, dysuria, frequency, hematuria and urgency.  Musculoskeletal:  Positive for arthralgias, back pain, gait problem and neck pain. Negative for joint swelling and myalgias.  Skin: Negative.  Negative for rash and wound.  Allergic/Immunologic: Negative.  Negative for immunocompromised state.  Neurological:  Positive for dizziness and weakness. Negative for tremors, seizures, numbness and headaches.  Hematological: Negative.  Negative for adenopathy. Does not bruise/bleed easily.  Psychiatric/Behavioral:  Positive for behavioral problems (Depression), decreased concentration and sleep disturbance. Negative for self-injury and suicidal ideas. The patient is nervous/anxious.     Vital Signs: BP 114/83   Pulse 62   Temp (!) 96.3 F (35.7 C)   Resp 16   Ht 5' 3 (1.6 m)   Wt 149 lb (67.6 kg)   SpO2 97%   BMI 26.39 kg/m    Physical Exam Vitals reviewed.  Constitutional:      General: Stacey Boyd. Stacey is not in  acute distress.    Appearance: Normal appearance. Stacey is normal weight. Stacey is not ill-appearing.  HENT:     Head: Normocephalic and atraumatic.  Eyes:     Pupils: Pupils are equal, round, and reactive to light.  Cardiovascular:     Rate and Rhythm: Normal rate and regular rhythm.  Pulmonary:     Effort: Pulmonary effort is normal. No respiratory distress.  Neurological:     Mental Status: Stacey is alert and oriented to person, place, and time.  Psychiatric:        Attention and Perception: Stacey is inattentive.        Mood and Affect: Mood normal. Mood is not depressed. Affect is blunt.        Speech: Speech is delayed.        Behavior: Behavior is slowed and withdrawn. Behavior is cooperative.        Thought Content: Thought content is not paranoid or delusional. Thought content does not include homicidal or suicidal ideation.        Cognition and Memory: Cognition is not impaired. Memory is impaired.        Judgment: Judgment normal. Judgment is not impulsive.        Assessment/Plan: 1. Acute bacterial pharyngitis Zpak prescribed, take until gone.  - azithromycin  (ZITHROMAX ) 250 MG tablet; Take 2 tablets on day 1, then 1 tablet daily on days 2 through 5  Dispense: 6 tablet; Refill: 0  2. Type 2 diabetes mellitus with stage 3a chronic kidney disease, without long-term current use of insulin (HCC) (Primary) A1c is stable, continue medications as prescribed and diabetic diet.  - POCT glycosylated hemoglobin (Hb A1C)  3. Senile purpura Noted. Continue to monitor   4. Hyperlipidemia associated with type 2 diabetes mellitus (HCC) Continue atorvastatin  as prescribed   5. Acquired hypothyroidism Continue levothyroxine  as prescribed   6. Dizziness Referred to neurology  - Ambulatory referral to Neurology  7. Impaired gait and mobility Referred to neurology  - Ambulatory referral to Neurology  8. Declining functional status Referred to neurology  - Ambulatory referral to  Neurology  9. Generalized weakness Referred  to neurology  - Ambulatory referral to Neurology   General Counseling: brittne kawasaki understanding of the findings of todays visit and agrees with plan of treatment. I have discussed any further diagnostic evaluation that may be needed or ordered today. We also reviewed her medications today. Stacey has been encouraged to call the office with any questions or concerns that should arise related to todays visit.    Orders Placed This Encounter  Procedures   Ambulatory referral to Neurology   POCT glycosylated hemoglobin (Hb A1C)    Meds ordered this encounter  Medications   azithromycin  (ZITHROMAX ) 250 MG tablet    Sig: Take 2 tablets on day 1, then 1 tablet daily on days 2 through 5    Dispense:  6 tablet    Refill:  0    Return in about 4 months (around 09/13/2024) for F/U, Recheck A1C, Tallan Sandoz PCP, please have pt put daughter on release form for info .   Total time spent:30 Minutes Time spent includes review of chart, medications, test results, and follow up plan with the patient.   [place] Controlled Substance Database was reviewed by me.  This patient was seen by Mardy Maxin, FNP-C in collaboration with Dr. Sigrid Bathe as a part of collaborative care agreement.   Rosene Pilling R. Maxin, MSN, FNP-C Internal medicine

## 2024-05-16 ENCOUNTER — Telehealth: Payer: Self-pay | Admitting: Nurse Practitioner

## 2024-05-16 NOTE — Telephone Encounter (Signed)
 Awaiting 05/15/24 office notes for Neurology referral-Toni

## 2024-05-19 ENCOUNTER — Encounter: Payer: Self-pay | Admitting: Nurse Practitioner

## 2024-05-22 ENCOUNTER — Telehealth: Payer: Self-pay | Admitting: Nurse Practitioner

## 2024-05-22 DIAGNOSIS — E1122 Type 2 diabetes mellitus with diabetic chronic kidney disease: Secondary | ICD-10-CM

## 2024-05-22 NOTE — Telephone Encounter (Signed)
 Neurology referral sent via [employer] to Dr. Lane with [facility]. Telephone # [phone]. Lvm for patient-Toni

## 2024-05-24 ENCOUNTER — Other Ambulatory Visit: Payer: Self-pay | Admitting: Nurse Practitioner

## 2024-05-24 DIAGNOSIS — M150 Primary generalized (osteo)arthritis: Secondary | ICD-10-CM

## 2024-08-27 ENCOUNTER — Ambulatory Visit: Admitting: Nurse Practitioner

## 2024-09-13 ENCOUNTER — Ambulatory Visit: Admitting: Nurse Practitioner
